# Patient Record
Sex: Female | Born: 1975 | Race: Black or African American | Hispanic: No | Marital: Single | Smoking: Never smoker
Health system: Southern US, Community
[De-identification: ages and names within clinical notes are randomized; demographics above are authoritative.]

## PROBLEM LIST (undated history)

## (undated) DIAGNOSIS — J020 Streptococcal pharyngitis: Secondary | ICD-10-CM

## (undated) DIAGNOSIS — F329 Major depressive disorder, single episode, unspecified: Secondary | ICD-10-CM

## (undated) DIAGNOSIS — F419 Anxiety disorder, unspecified: Secondary | ICD-10-CM

## (undated) DIAGNOSIS — F32A Depression, unspecified: Secondary | ICD-10-CM

## (undated) DIAGNOSIS — I2699 Other pulmonary embolism without acute cor pulmonale: Secondary | ICD-10-CM

## (undated) HISTORY — PX: WISDOM TOOTH EXTRACTION: SHX21

---

## 2004-01-20 ENCOUNTER — Other Ambulatory Visit: Admission: RE | Admit: 2004-01-20 | Discharge: 2004-01-20 | Payer: Self-pay | Admitting: Obstetrics and Gynecology

## 2005-11-21 ENCOUNTER — Inpatient Hospital Stay (HOSPITAL_COMMUNITY): Admission: AD | Admit: 2005-11-21 | Discharge: 2005-11-21 | Payer: Self-pay | Admitting: Obstetrics & Gynecology

## 2005-11-24 ENCOUNTER — Inpatient Hospital Stay (HOSPITAL_COMMUNITY): Admission: AD | Admit: 2005-11-24 | Discharge: 2005-11-24 | Payer: Self-pay | Admitting: Obstetrics and Gynecology

## 2005-12-01 ENCOUNTER — Inpatient Hospital Stay (HOSPITAL_COMMUNITY): Admission: AD | Admit: 2005-12-01 | Discharge: 2005-12-01 | Payer: Self-pay | Admitting: Obstetrics and Gynecology

## 2005-12-07 ENCOUNTER — Inpatient Hospital Stay (HOSPITAL_COMMUNITY): Admission: AD | Admit: 2005-12-07 | Discharge: 2005-12-07 | Payer: Self-pay | Admitting: *Deleted

## 2005-12-14 ENCOUNTER — Inpatient Hospital Stay (HOSPITAL_COMMUNITY): Admission: AD | Admit: 2005-12-14 | Discharge: 2005-12-14 | Payer: Self-pay | Admitting: *Deleted

## 2005-12-21 ENCOUNTER — Inpatient Hospital Stay (HOSPITAL_COMMUNITY): Admission: AD | Admit: 2005-12-21 | Discharge: 2005-12-21 | Payer: Self-pay | Admitting: *Deleted

## 2005-12-28 ENCOUNTER — Inpatient Hospital Stay (HOSPITAL_COMMUNITY): Admission: AD | Admit: 2005-12-28 | Discharge: 2005-12-28 | Payer: Self-pay | Admitting: *Deleted

## 2005-12-28 ENCOUNTER — Ambulatory Visit: Payer: Self-pay | Admitting: *Deleted

## 2005-12-31 ENCOUNTER — Ambulatory Visit: Payer: Self-pay | Admitting: *Deleted

## 2005-12-31 ENCOUNTER — Inpatient Hospital Stay (HOSPITAL_COMMUNITY): Admission: AD | Admit: 2005-12-31 | Discharge: 2005-12-31 | Payer: Self-pay | Admitting: *Deleted

## 2006-01-06 ENCOUNTER — Inpatient Hospital Stay (HOSPITAL_COMMUNITY): Admission: AD | Admit: 2006-01-06 | Discharge: 2006-01-06 | Payer: Self-pay | Admitting: Obstetrics & Gynecology

## 2006-01-13 ENCOUNTER — Inpatient Hospital Stay (HOSPITAL_COMMUNITY): Admission: AD | Admit: 2006-01-13 | Discharge: 2006-01-13 | Payer: Self-pay | Admitting: *Deleted

## 2006-07-25 ENCOUNTER — Other Ambulatory Visit: Admission: RE | Admit: 2006-07-25 | Discharge: 2006-07-25 | Payer: Self-pay | Admitting: Obstetrics and Gynecology

## 2006-12-12 ENCOUNTER — Emergency Department (HOSPITAL_COMMUNITY): Admission: EM | Admit: 2006-12-12 | Discharge: 2006-12-12 | Payer: Self-pay | Admitting: Emergency Medicine

## 2007-01-03 ENCOUNTER — Inpatient Hospital Stay (HOSPITAL_COMMUNITY): Admission: AD | Admit: 2007-01-03 | Discharge: 2007-01-03 | Payer: Self-pay | Admitting: Obstetrics and Gynecology

## 2007-04-22 ENCOUNTER — Inpatient Hospital Stay (HOSPITAL_COMMUNITY): Admission: AD | Admit: 2007-04-22 | Discharge: 2007-04-25 | Payer: Self-pay | Admitting: Obstetrics and Gynecology

## 2008-09-25 ENCOUNTER — Inpatient Hospital Stay (HOSPITAL_COMMUNITY): Admission: AD | Admit: 2008-09-25 | Discharge: 2008-09-25 | Payer: Self-pay | Admitting: Obstetrics and Gynecology

## 2008-10-03 ENCOUNTER — Emergency Department (HOSPITAL_COMMUNITY): Admission: EM | Admit: 2008-10-03 | Discharge: 2008-10-04 | Payer: Self-pay | Admitting: Emergency Medicine

## 2009-05-08 ENCOUNTER — Inpatient Hospital Stay (HOSPITAL_COMMUNITY): Admission: AD | Admit: 2009-05-08 | Discharge: 2009-05-08 | Payer: Self-pay | Admitting: Obstetrics & Gynecology

## 2009-05-11 ENCOUNTER — Inpatient Hospital Stay (HOSPITAL_COMMUNITY): Admission: AD | Admit: 2009-05-11 | Discharge: 2009-05-11 | Payer: Self-pay | Admitting: Obstetrics & Gynecology

## 2009-09-16 ENCOUNTER — Ambulatory Visit (HOSPITAL_COMMUNITY): Admission: RE | Admit: 2009-09-16 | Discharge: 2009-09-16 | Payer: Self-pay | Admitting: Obstetrics

## 2009-09-24 ENCOUNTER — Ambulatory Visit (HOSPITAL_COMMUNITY): Admission: RE | Admit: 2009-09-24 | Discharge: 2009-09-24 | Payer: Self-pay | Admitting: Obstetrics & Gynecology

## 2009-10-15 ENCOUNTER — Ambulatory Visit (HOSPITAL_COMMUNITY): Admission: RE | Admit: 2009-10-15 | Discharge: 2009-10-15 | Payer: Self-pay | Admitting: Obstetrics & Gynecology

## 2009-12-12 ENCOUNTER — Ambulatory Visit (HOSPITAL_COMMUNITY): Admission: RE | Admit: 2009-12-12 | Discharge: 2009-12-12 | Payer: Self-pay | Admitting: Obstetrics & Gynecology

## 2010-01-12 ENCOUNTER — Ambulatory Visit: Payer: Self-pay | Admitting: Advanced Practice Midwife

## 2010-01-12 ENCOUNTER — Inpatient Hospital Stay (HOSPITAL_COMMUNITY): Admission: AD | Admit: 2010-01-12 | Discharge: 2010-01-12 | Payer: Self-pay | Admitting: Obstetrics

## 2010-03-11 ENCOUNTER — Ambulatory Visit: Payer: Self-pay | Admitting: Physician Assistant

## 2010-03-11 ENCOUNTER — Inpatient Hospital Stay (HOSPITAL_COMMUNITY): Admission: AD | Admit: 2010-03-11 | Discharge: 2010-03-11 | Payer: Self-pay | Admitting: Obstetrics

## 2010-03-19 ENCOUNTER — Ambulatory Visit (HOSPITAL_COMMUNITY): Admission: RE | Admit: 2010-03-19 | Discharge: 2010-03-19 | Payer: Self-pay | Admitting: Obstetrics & Gynecology

## 2010-03-26 ENCOUNTER — Inpatient Hospital Stay (HOSPITAL_COMMUNITY): Admission: AD | Admit: 2010-03-26 | Discharge: 2010-03-28 | Payer: Self-pay | Admitting: Obstetrics & Gynecology

## 2010-12-27 ENCOUNTER — Encounter: Payer: Self-pay | Admitting: Emergency Medicine

## 2011-02-23 LAB — CBC
MCHC: 33.2 g/dL (ref 30.0–36.0)
Platelets: 123 10*3/uL — ABNORMAL LOW (ref 150–400)
RBC: 3.68 MIL/uL — ABNORMAL LOW (ref 3.87–5.11)
RDW: 14.2 % (ref 11.5–15.5)
WBC: 10 10*3/uL (ref 4.0–10.5)

## 2011-02-23 LAB — RPR: RPR Ser Ql: NONREACTIVE

## 2011-02-24 LAB — URINALYSIS, ROUTINE W REFLEX MICROSCOPIC
Bilirubin Urine: NEGATIVE
Glucose, UA: 100 mg/dL — AB
Glucose, UA: NEGATIVE mg/dL
Hgb urine dipstick: NEGATIVE
Protein, ur: NEGATIVE mg/dL
Specific Gravity, Urine: >1.03 — ABNORMAL HIGH (ref 1.005–1.030)
Specific Gravity, Urine: 1.02 (ref 1.005–1.030)
pH: 6 (ref 5.0–8.0)

## 2011-02-24 LAB — GLUCOSE, CAPILLARY: Glucose-Capillary: 102 mg/dL — ABNORMAL HIGH (ref 70–99)

## 2011-03-15 LAB — CBC
HCT: 37.6 % (ref 36.0–46.0)
Hemoglobin: 12.8 g/dL (ref 12.0–15.0)
RBC: 4.3 MIL/uL (ref 3.87–5.11)

## 2011-03-15 LAB — WET PREP, GENITAL
Clue Cells Wet Prep HPF POC: NONE SEEN
Trich, Wet Prep: NONE SEEN
Yeast Wet Prep HPF POC: NONE SEEN

## 2011-03-15 LAB — ABO/RH: ABO/RH(D): AB POS

## 2011-04-23 NOTE — Discharge Summary (Signed)
Dana Carpenter, Dana Carpenter NO.:  [id number]   MEDICAL RECORD NO.:  [id number]          PATIENT TYPE:  INP   LOCATION:  9127                          FACILITY:  [facility]   PHYSICIAN:  Huel Cote, M.D. DATE OF BIRTH:  Apr 09, 1976   DATE OF ADMISSION:  04/22/2007  DATE OF DISCHARGE:  04/25/2007                               DISCHARGE SUMMARY   DISCHARGE DIAGNOSES:  1. Term pregnancy at 37 weeks delivered.  2. Status post normal spontaneous vaginal delivery.   DISCHARGE MEDICATIONS:  1. Motrin 600 mg p.o. every 6 hours.  2. Percocet 1-2 tablets p.o. every 4 hours.   DISCHARGE FOLLOWUP:  The patient is to follow up in the office in 6  weeks for her routine postpartum exam.   HOSPITAL COURSE:  The patient 35 year old G8 P2-0-5-2 who presented at  72 plus weeks gestation to maternity admissions with contractions every  2-5 minutes and uncomfortable. Cervix changed from 3-5 cm with walking.  Prenatal care been uncomplicated. Prenatals as follows; AB positive, Rh  negative, sickle normal, RPR nonreactive, rubella immune, hepatitis B  surface antigen negative, HIV nonreactive, GC negative, chlamydia  negative, 1-hour Glucola 100, group B strep negative.   PAST OB HISTORY:  1993 she had an ectopic, 1997 she had a vaginal  delivery of 6 pounds 5 ounces infant, in 1999 she had a vaginal delivery  of 7 pounds 10 ounces and she had two elective abortions in 2000 and  2006.  She had one miscarriage in 2001 and one right ectopic pregnancy  in 2006.   PAST GYN HISTORY:  None.   PAST SURGICAL HISTORY:  D&C.   PAST MEDICAL HISTORY:  None.   ALLERGIES:  None.   HOSPITAL COURSE:  On admission she was afebrile with stable vital signs.  Fetal heart rate was reactive was reassuring.  Contractions were every 2-  5 minutes.  Cervix was 80, 4-5, and -1 station.  She had rupture of  membranes performed and continued to progress well.  She reached  complete dilation and pushed great  with a normal spontaneous vaginal  delivery of a vigorous female infant over intact perineum.  There is a  tight nuchal cord delivered through. Moderate shoulder dystocia was  relieved with McRoberts and corkscrew maneuver.  Apgars were  08/09, weight was 7 pounds 14 ounces.  She had a small right  periurethral laceration reapproximated with 3-0 Vicryl for hemostasis.  By postpartum day #2 she was doing quite well and her discharge  hemoglobin was 9.8.  She was tolerating her pain medicines and was felt  stable for discharge home.      Huel Cote, M.D.  Electronically Signed     KR/MEDQ  D:  06/05/2007  T:  06/05/2007  Job:  [id number]

## 2011-09-06 LAB — WET PREP, GENITAL: Clue Cells Wet Prep HPF POC: NONE SEEN

## 2011-09-06 LAB — CBC
HCT: 32.2 — ABNORMAL LOW
MCHC: 32.9
MCV: 86.8
Platelets: 220
RDW: 14.2

## 2011-09-06 LAB — GC/CHLAMYDIA PROBE AMP, GENITAL: Chlamydia, DNA Probe: NEGATIVE

## 2011-09-07 LAB — POCT CARDIAC MARKERS
CKMB, poc: <1 — ABNORMAL LOW
Myoglobin, poc: 35.5

## 2011-09-07 LAB — D-DIMER, QUANTITATIVE: D-Dimer, Quant: 1.54 — ABNORMAL HIGH

## 2012-02-06 ENCOUNTER — Encounter (HOSPITAL_COMMUNITY): Payer: Self-pay | Admitting: *Deleted

## 2012-02-06 ENCOUNTER — Emergency Department (INDEPENDENT_AMBULATORY_CARE_PROVIDER_SITE_OTHER)
Admission: EM | Admit: 2012-02-06 | Discharge: 2012-02-06 | Disposition: A | Discharge status: Home / Self Care | Payer: Managed Care, Other (non HMO) | Source: Home / Self Care | Attending: Family Medicine | Admitting: Family Medicine

## 2012-02-06 DIAGNOSIS — J029 Acute pharyngitis, unspecified: Secondary | ICD-10-CM

## 2012-02-06 HISTORY — DX: Streptococcal pharyngitis: J02.0

## 2012-02-06 NOTE — ED Notes (Signed)
Was treated for strep and sinusitis 2 wks ago - finished amoxicillin Friday, but continues with very painful swallowing.  Now also has bilat earache and cough w/ greenish sputum and occasional SOB.  Denies any fevers.

## 2012-02-06 NOTE — ED Provider Notes (Signed)
History     CSN: [id number]  Arrival date & time 02/06/12  1335   First MD Initiated Contact with Patient 02/06/12 1423      Chief Complaint  Patient presents with  . Sore Throat  . Otalgia  . Cough    (Consider location/radiation/quality/duration/timing/severity/associated sxs/prior treatment) Patient is a 36 y.o. female presenting with pharyngitis. The history is provided by the patient. No language interpreter was used.  Sore Throat This is a new problem. The problem occurs constantly. The problem has been gradually worsening. The symptoms are aggravated by nothing. The symptoms are relieved by nothing. She has tried nothing for the symptoms. The treatment provided no relief.  Pt complains of a sore throat.  Pt complains of pain swallowing.  Pt reports she took an antibiotic and it did not help.  Pt is here with child.  Child has viral sounding illness  No past medical history on file.  No past surgical history on file.  No family history on file.  History  Substance Use Topics  . Smoking status: Not on file  . Smokeless tobacco: Not on file  . Alcohol Use: Not on file    OB History    No data available      Review of Systems  HENT: Positive for congestion and mouth sores.   All other systems reviewed and are negative.    Allergies  Review of patient's allergies indicates not on file.  Home Medications  No current outpatient prescriptions on file.  Pulse 95  Temp(Src) 98.1 F (36.7 C) (Oral)  Resp 18  SpO2 98%  Physical Exam  Vitals reviewed. Constitutional: She appears well-developed and well-nourished.  HENT:  Head: Normocephalic and atraumatic.  Right Ear: External ear normal.  Left Ear: External ear normal.  Mouth/Throat: Oropharynx is clear and moist.  Eyes: Conjunctivae and EOM are normal. Pupils are equal, round, and reactive to light.  Neck: Normal range of motion. Neck supple.  Cardiovascular: Normal rate.   Pulmonary/Chest: Effort  normal.  Abdominal: Soft.  Musculoskeletal: Normal range of motion.  Neurological: She is alert. She has normal reflexes.  Skin: Skin is warm.  Psychiatric: She has a normal mood and affect.    ED Course  Procedures (including critical care time)   Labs Reviewed  POCT RAPID STREP A ([facility] CARE ONLY)   No results found.   No diagnosis found.    MDM  Strep negative,  I suspect viral illness.   I advised ibuprofen,  Salt water gargles,        Langston Masker, [place] 02/06/12 1504

## 2012-02-06 NOTE — Discharge Instructions (Signed)
Antibiotic Nonuse  Your caregiver felt that the infection or problem was not one that would be helped with an antibiotic. Infections may be caused by viruses or bacteria. Only a caregiver can tell which one of these is the likely cause of an illness. A cold is the most common cause of infection in both adults and children. A cold is a virus. Antibiotic treatment will have no effect on a viral infection. Viruses can lead to many lost days of work caring for sick children and many missed days of school. Children may catch as many as 10 "colds" or "flus" per year during which they can be tearful, cranky, and uncomfortable. The goal of treating a virus is aimed at keeping the ill person comfortable. Antibiotics are medications used to help the body fight bacterial infections. There are relatively few types of bacteria that cause infections but there are hundreds of viruses. While both viruses and bacteria cause infection they are very different types of germs. A viral infection will typically go away by itself within 7 to 10 days. Bacterial infections may spread or get worse without antibiotic treatment. Examples of bacterial infections are:  Sore throats (like strep throat or tonsillitis).   Infection in the lung (pneumonia).   Ear and skin infections.  Examples of viral infections are:  Colds or flus.   Most coughs and bronchitis.   Sore throats not caused by Strep.   Runny noses.  It is often best not to take an antibiotic when a viral infection is the cause of the problem. Antibiotics can kill off the helpful bacteria that we have inside our body and allow harmful bacteria to start growing. Antibiotics can cause side effects such as allergies, nausea, and diarrhea without helping to improve the symptoms of the viral infection. Additionally, repeated uses of antibiotics can cause bacteria inside of our body to become resistant. That resistance can be passed onto harmful bacterial. The next time  you have an infection it may be harder to treat if antibiotics are used when they are not needed. Not treating with antibiotics allows our own immune system to develop and take care of infections more efficiently. Also, antibiotics will work better for us when they are prescribed for bacterial infections. Treatments for a child that is ill may include:  Give extra fluids throughout the day to stay hydrated.   Get plenty of rest.   Only give your child over-the-counter or prescription medicines for pain, discomfort, or fever as directed by your caregiver.   The use of a cool mist humidifier may help stuffy noses.   Cold medications if suggested by your caregiver.  Your caregiver may decide to start you on an antibiotic if:  The problem you were seen for today continues for a longer length of time than expected.   You develop a secondary bacterial infection.  SEEK MEDICAL CARE IF:  Fever lasts longer than 5 days.   Symptoms continue to get worse after 5 to 7 days or become severe.   Difficulty in breathing develops.   Signs of dehydration develop (poor drinking, rare urinating, dark colored urine).   Changes in behavior or worsening tiredness (listlessness or lethargy).  Document Released: 01/31/2002 Document Revised: 11/11/2011 Document Reviewed: 07/30/2009 Exit[employer] Patient Information 2012 [employer]. 

## 2012-02-08 NOTE — ED Provider Notes (Signed)
Medical screening examination/treatment/procedure(s) were performed by resident physician or non-physician practitioner and as supervising physician I was immediately available for consultation/collaboration.   Jahzir Strohmeier DOUGLAS MD.    Vernica Wachtel Douglas Snigdha Howser, MD 02/08/12 1509 

## 2012-05-14 ENCOUNTER — Ambulatory Visit: Payer: Managed Care, Other (non HMO) | Admitting: Physician Assistant

## 2012-05-14 VITALS — BP 114/71 | HR 82 | Temp 98.2°F | Resp 16 | Ht 67.5 in | Wt 193.0 lb

## 2012-05-14 DIAGNOSIS — N899 Noninflammatory disorder of vagina, unspecified: Secondary | ICD-10-CM

## 2012-05-14 DIAGNOSIS — B373 Candidiasis of vulva and vagina: Secondary | ICD-10-CM

## 2012-05-14 DIAGNOSIS — N760 Acute vaginitis: Secondary | ICD-10-CM

## 2012-05-14 DIAGNOSIS — N898 Other specified noninflammatory disorders of vagina: Secondary | ICD-10-CM

## 2012-05-14 DIAGNOSIS — L293 Anogenital pruritus, unspecified: Secondary | ICD-10-CM

## 2012-05-14 LAB — POCT WET PREP WITH KOH

## 2012-05-14 MED ORDER — METRONIDAZOLE 500 MG PO TABS: 500.0000 mg | ORAL_TABLET | Freq: Two times a day (BID) | ORAL | Status: AC

## 2012-05-14 MED ORDER — FLUCONAZOLE 150 MG PO TABS: 150.0000 mg | ORAL_TABLET | ORAL | Status: AC

## 2012-05-14 NOTE — Progress Notes (Signed)
  Subjective:    Patient ID: Dana Carpenter, female    DOB: 1976/03/30, 36 y.o.   MRN: [id number]  HPI Dana Carpenter comes in today c/o 5 days of vaginal irritation and yellowish discharge.  She was tested 1 month ago for STD and results were negative.  She has not had any new partners since.  Last IC was 2-3 weeks ago.  She has used Monistat that seemed to make her symptoms worse.  She has no abdominal pain, back pain, n/v or f/c.  LMP 04/21/12.  She started BCP 2-3 weeks ago.  Review of Systems As noted in HPI    Objective:   Physical Exam  Constitutional: She is oriented to person, place, and time. Vital signs are normal. She appears well-developed and well-nourished.  Cardiovascular: Normal rate and regular rhythm.   Pulmonary/Chest: Effort normal and breath sounds normal.  Abdominal: Normal appearance and bowel sounds are normal. There is no tenderness.  Genitourinary: Uterus normal.    There is no rash, tenderness or lesion on the right labia. There is rash on the left labia. There is no tenderness or lesion on the left labia. Cervix exhibits no motion tenderness and no friability. Right adnexum displays no tenderness. Left adnexum displays no tenderness. Vaginal discharge (Thick yellow white discharge) found.  Neurological: She is alert and oriented to person, place, and time.  Skin: Skin is warm and dry.     Results for orders placed in visit on 05/14/12  POCT WET PREP WITH KOH      Component Value Range   Trichomonas, UA Negative     Clue Cells Wet Prep HPF POC tntc     Epithelial Wet Prep HPF POC 3-     Yeast Wet Prep HPF POC neg     Bacteria Wet Prep HPF POC 4+     RBC Wet Prep HPF POC 2-3     WBC Wet Prep HPF POC 3-4     KOH Prep POC Positive          Assessment & Plan:  BV Yeast vaginitis  Diflucan 150 mg 1 po now and in 1 week.   Flagyl 500 mg bid for 7 days.  No ETOH Hand out given Return 2-3 weeks for retest wet prep

## 2012-05-14 NOTE — Patient Instructions (Signed)
Bacterial Vaginosis Bacterial vaginosis (BV) is a vaginal infection where the normal balance of bacteria in the vagina is disrupted. The normal balance is then replaced by an overgrowth of certain bacteria. There are several different kinds of bacteria that can cause BV. BV is the most common vaginal infection in women of childbearing age. CAUSES   The cause of BV is not fully understood. BV develops when there is an increase or imbalance of harmful bacteria.   Some activities or behaviors can upset the normal balance of bacteria in the vagina and put women at increased risk including:   Having a new sex partner or multiple sex partners.   Douching.   Using an intrauterine device (IUD) for contraception.   It is not clear what role sexual activity plays in the development of BV. However, women that have never had sexual intercourse are rarely infected with BV.  Women do not get BV from toilet seats, bedding, swimming pools or from touching objects around them.  SYMPTOMS   Grey vaginal discharge.   A fish-like odor with discharge, especially after sexual intercourse.   Itching or burning of the vagina and vulva.   Burning or pain with urination.   Some women have no signs or symptoms at all.  DIAGNOSIS  Your caregiver must examine the vagina for signs of BV. Your caregiver will perform lab tests and look at the sample of vaginal fluid through a microscope. They will look for bacteria and abnormal cells (clue cells), a pH test higher than 4.5, and a positive amine test all associated with BV.  RISKS AND COMPLICATIONS   Pelvic inflammatory disease (PID).   Infections following gynecology surgery.   Developing HIV.   Developing herpes virus.  TREATMENT  Sometimes BV will clear up without treatment. However, all women with symptoms of BV should be treated to avoid complications, especially if gynecology surgery is planned. Female partners generally do not need to be treated. However,  BV may spread between female sex partners so treatment is helpful in preventing a recurrence of BV.   BV may be treated with antibiotics. The antibiotics come in either pill or vaginal cream forms. Either can be used with nonpregnant or pregnant women, but the recommended dosages differ. These antibiotics are not harmful to the baby.   BV can recur after treatment. If this happens, a second round of antibiotics will often be prescribed.   Treatment is important for pregnant women. If not treated, BV can cause a premature delivery, especially for a pregnant woman who had a premature birth in the past. All pregnant women who have symptoms of BV should be checked and treated.   For chronic reoccurrence of BV, treatment with a type of prescribed gel vaginally twice a week is helpful.  HOME CARE INSTRUCTIONS   Finish all medication as directed by your caregiver.   Do not have sex until treatment is completed.   Tell your sexual partner that you have a vaginal infection. They should see their caregiver and be treated if they have problems, such as a mild rash or itching.   Practice safe sex. Use condoms. Only have 1 sex partner.  PREVENTION  Basic prevention steps can help reduce the risk of upsetting the natural balance of bacteria in the vagina and developing BV:  Do not have sexual intercourse (be abstinent).   Do not douche.   Use all of the medicine prescribed for treatment of BV, even if the signs and symptoms go away.     Tell your sex partner if you have BV. That way, they can be treated, if needed, to prevent reoccurrence.  SEEK MEDICAL CARE IF:   Your symptoms are not improving after 3 days of treatment.   You have increased discharge, pain, or fever.  MAKE SURE YOU:   Understand these instructions.   Will watch your condition.   Will get help right away if you are not doing well or get worse.  FOR MORE INFORMATION  Division of STD Prevention (DSTDP), Centers for Disease  Control and Prevention: www.cdc.gov/std American [employer] Association (ASHA): www.ashastd.org  Document Released: 11/22/2005 Document Revised: 11/11/2011 Document Reviewed: 05/15/2009 [employer] Patient Information 2012 [employer].Monilial Vaginitis Vaginitis in a soreness, swelling and redness (inflammation) of the vagina and vulva. Monilial vaginitis is not a sexually transmitted infection. CAUSES  Yeast vaginitis is caused by yeast (candida) that is normally found in your vagina. With a yeast infection, the candida has overgrown in number to a point that upsets the chemical balance. SYMPTOMS   White, thick vaginal discharge.   Swelling, itching, redness and irritation of the vagina and possibly the lips of the vagina (vulva).   Burning or painful urination.   Painful intercourse.  DIAGNOSIS  Things that may contribute to monilial vaginitis are:  Postmenopausal and virginal states.   Pregnancy.   Infections.   Being tired, sick or stressed, especially if you had monilial vaginitis in the past.   Diabetes. Good control will help lower the chance.   Birth control pills.   Tight fitting garments.   Using bubble bath, feminine sprays, douches or deodorant tampons.   Taking certain medications that kill germs (antibiotics).   Sporadic recurrence can occur if you become ill.  TREATMENT  Your caregiver will give you medication.  There are several kinds of anti monilial vaginal creams and suppositories specific for monilial vaginitis. For recurrent yeast infections, use a suppository or cream in the vagina 2 times a week, or as directed.   Anti-monilial or steroid cream for the itching or irritation of the vulva may also be used. Get your caregiver's permission.   Painting the vagina with methylene blue solution may help if the monilial cream does not work.   Eating yogurt may help prevent monilial vaginitis.  HOME CARE INSTRUCTIONS   Finish all medication as  prescribed.   Do not have sex until treatment is completed or after your caregiver tells you it is okay.   Take warm sitz baths.   Do not douche.   Do not use tampons, especially scented ones.   Wear cotton underwear.   Avoid tight pants and panty hose.   Tell your sexual partner that you have a yeast infection. They should go to their caregiver if they have symptoms such as mild rash or itching.   Your sexual partner should be treated as well if your infection is difficult to eliminate.   Practice safer sex. Use condoms.   Some vaginal medications cause latex condoms to fail. Vaginal medications that harm condoms are:   Cleocin cream.   Butoconazole (Femstat).   Terconazole (Terazol) vaginal suppository.   Miconazole (Monistat) (may be purchased over the counter).  SEEK MEDICAL CARE IF:   You have a temperature by mouth above 102 F (38.9 C).   The infection is getting worse after 2 days of treatment.   The infection is not getting better after 3 days of treatment.   You develop blisters in or around your vagina.   You   develop vaginal bleeding, and it is not your menstrual period.   You have pain when you urinate.   You develop intestinal problems.   You have pain with sexual intercourse.  Document Released: 09/01/2005 Document Revised: 11/11/2011 Document Reviewed: 05/16/2009 Exit[employer] Patient Information 2012 [employer]. 

## 2012-11-30 ENCOUNTER — Encounter (HOSPITAL_BASED_OUTPATIENT_CLINIC_OR_DEPARTMENT_OTHER): Payer: Self-pay | Admitting: Student

## 2012-11-30 ENCOUNTER — Emergency Department (HOSPITAL_BASED_OUTPATIENT_CLINIC_OR_DEPARTMENT_OTHER)
Admission: EM | Admit: 2012-11-30 | Discharge: 2012-11-30 | Disposition: A | Discharge status: Home / Self Care | Payer: Managed Care, Other (non HMO) | Attending: Emergency Medicine | Admitting: Emergency Medicine

## 2012-11-30 DIAGNOSIS — J3489 Other specified disorders of nose and nasal sinuses: Secondary | ICD-10-CM | POA: Insufficient documentation

## 2012-11-30 DIAGNOSIS — J069 Acute upper respiratory infection, unspecified: Secondary | ICD-10-CM | POA: Insufficient documentation

## 2012-11-30 DIAGNOSIS — R059 Cough, unspecified: Secondary | ICD-10-CM | POA: Insufficient documentation

## 2012-11-30 DIAGNOSIS — R6889 Other general symptoms and signs: Secondary | ICD-10-CM | POA: Insufficient documentation

## 2012-11-30 DIAGNOSIS — J029 Acute pharyngitis, unspecified: Secondary | ICD-10-CM | POA: Insufficient documentation

## 2012-11-30 DIAGNOSIS — IMO0001 Reserved for inherently not codable concepts without codable children: Secondary | ICD-10-CM | POA: Insufficient documentation

## 2012-11-30 DIAGNOSIS — R1310 Dysphagia, unspecified: Secondary | ICD-10-CM | POA: Insufficient documentation

## 2012-11-30 DIAGNOSIS — R52 Pain, unspecified: Secondary | ICD-10-CM | POA: Insufficient documentation

## 2012-11-30 DIAGNOSIS — R509 Fever, unspecified: Secondary | ICD-10-CM | POA: Insufficient documentation

## 2012-11-30 DIAGNOSIS — R51 Headache: Secondary | ICD-10-CM | POA: Insufficient documentation

## 2012-11-30 DIAGNOSIS — R05 Cough: Secondary | ICD-10-CM | POA: Insufficient documentation

## 2012-11-30 DIAGNOSIS — Z79899 Other long term (current) drug therapy: Secondary | ICD-10-CM | POA: Insufficient documentation

## 2012-11-30 DIAGNOSIS — R112 Nausea with vomiting, unspecified: Secondary | ICD-10-CM | POA: Insufficient documentation

## 2012-11-30 DIAGNOSIS — R42 Dizziness and giddiness: Secondary | ICD-10-CM | POA: Insufficient documentation

## 2012-11-30 MED ORDER — PROMETHAZINE HCL 25 MG PO TABS
25.0000 mg | ORAL_TABLET | Freq: Four times a day (QID) | ORAL | Status: DC | PRN
Start: 2012-11-30 — End: 2013-03-26

## 2012-11-30 MED ORDER — ACETAMINOPHEN-CODEINE #3 300-30 MG PO TABS: 1.0000 | ORAL_TABLET | Freq: Four times a day (QID) | ORAL | Status: DC | PRN

## 2012-11-30 MED ORDER — PENICILLIN G BENZATHINE 1200000 UNIT/2ML IM SUSP
1.2000 10*6.[IU] | Freq: Once | INTRAMUSCULAR | Status: AC
  Administered 2012-11-30: 1.2 10*6.[IU] via INTRAMUSCULAR
  Filled 2012-11-30: qty 2

## 2012-11-30 NOTE — ED Provider Notes (Signed)
History     CSN: [id number]  Arrival date & time 11/30/12  1402   First MD Initiated Contact with Patient 11/30/12 [phone]      Chief Complaint  Patient presents with  . Sore Throat  . Generalized Body Aches  . Fever    (Consider location/radiation/quality/duration/timing/severity/associated sxs/prior treatment) HPI Comments: Dana Carpenter presents ambulatory stating Dana Carpenter feels ill.  Dana Carpenter describes having a headache, sore throat, runny nose, a nonproductive cough, and myalgias.  Dana Carpenter denies any shortness of breath.  Dana Carpenter also denies neck pain or stiffness.  Dana Carpenter is unaware of any sick contacts.  Patient is a 36 y.o. female presenting with pharyngitis and fever. The history is provided by the patient. No language interpreter was used.  Sore Throat This is a new problem. The current episode started yesterday. The problem occurs constantly. The problem has been gradually worsening. Associated symptoms include headaches. Pertinent negatives include no chest pain, no abdominal pain and no shortness of breath. The symptoms are aggravated by swallowing. Dana Carpenter has tried nothing for the symptoms.  Fever Primary symptoms of the febrile illness include fever, fatigue, headaches, cough, nausea, vomiting (2 episodes) and myalgias. Primary symptoms do not include wheezing, shortness of breath, abdominal pain, diarrhea, arthralgias or rash.  The headache is not associated with neck stiffness.    Past Medical History  Diagnosis Date  . Strep pharyngitis     History reviewed. No pertinent past surgical history.  History reviewed. No pertinent family history.  History  Substance Use Topics  . Smoking status: Never Smoker   . Smokeless tobacco: Not on file  . Alcohol Use: No    OB History    Grav Para Term Preterm Abortions TAB SAB Ect Mult Living                  Review of Systems  Constitutional: Positive for fever, chills and fatigue. Negative for diaphoresis, activity change and appetite  change.  HENT: Positive for congestion, sore throat, rhinorrhea and trouble swallowing. Negative for hearing loss, ear pain, nosebleeds, facial swelling, mouth sores, neck pain, neck stiffness and ear discharge.   Eyes: Negative.   Respiratory: Positive for cough. Negative for choking, chest tightness, shortness of breath, wheezing and stridor.   Cardiovascular: Negative for chest pain, palpitations and leg swelling.  Gastrointestinal: Positive for nausea and vomiting (2 episodes). Negative for abdominal pain and diarrhea.  Genitourinary: Negative.   Musculoskeletal: Positive for myalgias. Negative for arthralgias.  Skin: Negative for rash.  Neurological: Positive for headaches. Negative for syncope and light-headedness.  Hematological: Negative.   Psychiatric/Behavioral: Negative.     Allergies  Review of patient's allergies indicates no known allergies.  Home Medications   Current Outpatient Rx  Name  Route  Sig  Dispense  Refill  . NORGESTIMATE-ETH ESTRADIOL 0.25-35 MG-MCG PO TABS   Oral   Take 1 tablet by mouth daily.         Marland Kitchen UNKNOWN TO PATIENT      Oral BCP           BP 120/76  Pulse 108  Temp 99.2 F (37.3 C) (Oral)  Resp 20  Ht 5\' 6"  (1.676 m)  Wt 190 lb (86.183 kg)  BMI 30.67 kg/m2  SpO2 100%  LMP 11/05/2012  Physical Exam  Nursing note and vitals reviewed. Constitutional: Dana Carpenter is oriented to person, place, and time. Dana Carpenter appears well-developed and well-nourished. Dana Carpenter is active.  Non-toxic appearance. Dana Carpenter does not have a sickly appearance. Dana Carpenter does  not appear ill. No distress.  HENT:  Head: Normocephalic. Head is without raccoon's eyes, without Battle's sign, without contusion, without right periorbital erythema and without left periorbital erythema. No trismus in the jaw.  Right Ear: Hearing, tympanic membrane, external ear and ear canal normal. No mastoid tenderness. Tympanic membrane is not injected and not erythematous. No hemotympanum.  Left Ear:  Hearing, tympanic membrane, external ear and ear canal normal. No mastoid tenderness. Tympanic membrane is not erythematous. No hemotympanum.  Nose: Rhinorrhea present. No mucosal edema, sinus tenderness or nasal deformity. No epistaxis. Right sinus exhibits no maxillary sinus tenderness and no frontal sinus tenderness. Left sinus exhibits no maxillary sinus tenderness and no frontal sinus tenderness.  Mouth/Throat: Uvula is midline and mucous membranes are normal. Mucous membranes are not pale, not dry and not cyanotic. Dana Carpenter does not have dentures. No oral lesions. Normal dentition. No dental abscesses, uvula swelling, lacerations or dental caries. Oropharyngeal exudate and posterior oropharyngeal erythema present. No posterior oropharyngeal edema or tonsillar abscesses.  Eyes: Conjunctivae normal are normal. Pupils are equal, round, and reactive to light. Right eye exhibits no discharge. Left eye exhibits no discharge. No scleral icterus.  Neck: Normal range of motion. Neck supple. No JVD present. No tracheal deviation present.  Cardiovascular: Regular rhythm, S1 normal, S2 normal, normal heart sounds, intact distal pulses and normal pulses.   No extrasystoles are present. Tachycardia present.  PMI is not displaced.  Exam reveals no gallop and no friction rub.   No murmur heard. Pulmonary/Chest: Effort normal and breath sounds normal. No stridor. No respiratory distress. Dana Carpenter has no wheezes. Dana Carpenter has no rales. Dana Carpenter exhibits no tenderness.  Abdominal: Soft. Bowel sounds are normal. Dana Carpenter exhibits no distension and no mass. There is no tenderness. There is no rebound and no guarding.  Musculoskeletal: Normal range of motion. Dana Carpenter exhibits no edema and no tenderness.  Lymphadenopathy:    Dana Carpenter has no cervical adenopathy.  Neurological: Dana Carpenter is alert and oriented to person, place, and time. No cranial nerve deficit.  Skin: Skin is warm and dry. No rash noted. Dana Carpenter is not diaphoretic. No erythema. No pallor.    Psychiatric: Dana Carpenter has a normal mood and affect. Her behavior is normal.    ED Course  Procedures (including critical care time)  Labs Reviewed - No data to display No results found.   No diagnosis found.    MDM  Pt presents for evaluation of a generalized ill feeling, myalgias, and sore throat.  Dana Carpenter is borderline febrile, but appears nontoxic, NAD.  Dana Carpenter has exudates covering her tonsils but no asymmetry or evidence of airway compromise.  Plan treat with a 1 time shot of penicillin.  Will provide a prescription for T#3 and an antiemetic also.  Encouraged close outpt follow-up.        Tobin Chad, MD 11/30/12 [phone]

## 2012-11-30 NOTE — ED Notes (Signed)
Cold symptoms since Monday, chills, fever, flu like symptoms

## 2012-12-02 ENCOUNTER — Emergency Department (HOSPITAL_BASED_OUTPATIENT_CLINIC_OR_DEPARTMENT_OTHER)
Admission: EM | Admit: 2012-12-02 | Discharge: 2012-12-02 | Disposition: A | Discharge status: Home / Self Care | Payer: Managed Care, Other (non HMO) | Attending: Emergency Medicine | Admitting: Emergency Medicine

## 2012-12-02 ENCOUNTER — Encounter (HOSPITAL_BASED_OUTPATIENT_CLINIC_OR_DEPARTMENT_OTHER): Payer: Self-pay | Admitting: *Deleted

## 2012-12-02 DIAGNOSIS — R110 Nausea: Secondary | ICD-10-CM | POA: Insufficient documentation

## 2012-12-02 DIAGNOSIS — R5383 Other fatigue: Secondary | ICD-10-CM | POA: Insufficient documentation

## 2012-12-02 DIAGNOSIS — Z79899 Other long term (current) drug therapy: Secondary | ICD-10-CM | POA: Insufficient documentation

## 2012-12-02 DIAGNOSIS — Z3202 Encounter for pregnancy test, result negative: Secondary | ICD-10-CM | POA: Insufficient documentation

## 2012-12-02 DIAGNOSIS — J029 Acute pharyngitis, unspecified: Secondary | ICD-10-CM

## 2012-12-02 DIAGNOSIS — R5381 Other malaise: Secondary | ICD-10-CM | POA: Insufficient documentation

## 2012-12-02 DIAGNOSIS — R51 Headache: Secondary | ICD-10-CM

## 2012-12-02 LAB — URINALYSIS, ROUTINE W REFLEX MICROSCOPIC
Glucose, UA: NEGATIVE mg/dL
Hgb urine dipstick: NEGATIVE
Ketones, ur: NEGATIVE mg/dL
Leukocytes, UA: NEGATIVE
Protein, ur: NEGATIVE mg/dL

## 2012-12-02 MED ORDER — METOCLOPRAMIDE HCL 5 MG/ML IJ SOLN
10.0000 mg | Freq: Once | INTRAMUSCULAR | Status: AC
  Administered 2012-12-02: 10 mg via INTRAVENOUS
  Filled 2012-12-02: qty 2

## 2012-12-02 MED ORDER — DIPHENHYDRAMINE HCL 50 MG/ML IJ SOLN
25.0000 mg | Freq: Once | INTRAMUSCULAR | Status: AC
  Administered 2012-12-02: 25 mg via INTRAVENOUS
  Filled 2012-12-02: qty 1

## 2012-12-02 MED ORDER — IBUPROFEN 600 MG PO TABS: 600.0000 mg | ORAL_TABLET | Freq: Four times a day (QID) | ORAL | Status: DC | PRN

## 2012-12-02 MED ORDER — KETOROLAC TROMETHAMINE 30 MG/ML IJ SOLN
30.0000 mg | Freq: Once | INTRAMUSCULAR | Status: AC
  Administered 2012-12-02: 30 mg via INTRAVENOUS
  Filled 2012-12-02: qty 1

## 2012-12-02 MED ORDER — PROCHLORPERAZINE EDISYLATE 5 MG/ML IJ SOLN
10.0000 mg | Freq: Four times a day (QID) | INTRAMUSCULAR | Status: DC | PRN
  Filled 2012-12-02: qty 2

## 2012-12-02 MED ORDER — SODIUM CHLORIDE 0.9 % IV BOLUS (SEPSIS)
1000.0000 mL | Freq: Once | INTRAVENOUS | Status: AC
  Administered 2012-12-02: 1000 mL via INTRAVENOUS

## 2012-12-02 NOTE — ED Notes (Signed)
Seen Thursday for chills and aching (here) Those s/s are gone, but now has ST, H/A and nausea

## 2012-12-02 NOTE — ED Provider Notes (Signed)
History   This chart was scribed for Ethelda Chick, MD scribed by Magnus Sinning. The patient was seen in room MH10/MH10 at 16:49   CSN: [id number]  Arrival date & time 12/02/12  1529    Chief Complaint  Patient presents with  . Headache  . URI    (Consider location/radiation/quality/duration/timing/severity/associated sxs/prior treatment) HPI Comments: Dana Carpenter is a 36 y.o. female who presents to the Emergency Department complaining of constant moderate HA, onset two days with associated ST and nausea. Patient was seen two days ago for ST with exudates, chills, and generalized body aches. She was reportedly given a pencillin shot for treatment, but she provides that she has continued to have ST and now has HA and nausea.   Patient notes she has been very tired and has been sleeping all day.  She states she has taken tylenol with codeine with minimal relief and she denies emesis. She states fever is unknown today, as she did not check temperature at home.  Patient is a 36 y.o. female presenting with headaches. The history is provided by the patient. No language interpreter was used.  Headache  This is a new problem. The current episode started 2 days ago. The problem occurs constantly. The problem has not changed since onset.The headache is associated with bright light. The pain is moderate. The pain does not radiate. Associated symptoms include malaise/fatigue and nausea. Pertinent negatives include no vomiting. She has tried acetaminophen for the symptoms. The treatment provided mild relief.    Past Medical History  Diagnosis Date  . Strep pharyngitis     History reviewed. No pertinent past surgical history.  History reviewed. No pertinent family history.  History  Substance Use Topics  . Smoking status: Never Smoker   . Smokeless tobacco: Not on file  . Alcohol Use: No    Review of Systems  Constitutional: Positive for malaise/fatigue.  HENT: Positive for sore  throat.   Gastrointestinal: Positive for nausea. Negative for vomiting.  Neurological: Positive for headaches.  All other systems reviewed and are negative.    Allergies  Review of patient's allergies indicates no known allergies.  Home Medications   Current Outpatient Rx  Name  Route  Sig  Dispense  Refill  . ACETAMINOPHEN-CODEINE #3 300-30 MG PO TABS   Oral   Take 1-2 tablets by mouth every 6 (six) hours as needed for pain.   15 tablet   0   . IBUPROFEN 600 MG PO TABS   Oral   Take 1 tablet (600 mg total) by mouth every 6 (six) hours as needed for pain.   30 tablet   0   . NORGESTIMATE-ETH ESTRADIOL 0.25-35 MG-MCG PO TABS   Oral   Take 1 tablet by mouth daily.         Marland Kitchen PROMETHAZINE HCL 25 MG PO TABS   Oral   Take 1 tablet (25 mg total) by mouth every 6 (six) hours as needed for nausea.   12 tablet   0   . UNKNOWN TO PATIENT      Oral BCP           BP 131/74  Pulse 75  Temp 98 F (36.7 C) (Oral)  Resp 18  Ht 5\' 6"  (1.676 m)  Wt 190 lb (86.183 kg)  BMI 30.67 kg/m2  SpO2 100%  LMP 11/05/2012  Physical Exam  Nursing note and vitals reviewed. Constitutional: She is oriented to person, place, and time. She appears well-developed and  well-nourished. No distress.  HENT:  Head: Normocephalic and atraumatic.  Mouth/Throat: Uvula is midline. No oropharyngeal exudate.       Shotty anterior cervial lymphadenopathy  Oropharynx is mildy erythmatous with no exudates Palate is symmetric and uvula is midline.   Eyes: Conjunctivae normal and EOM are normal. Pupils are equal, round, and reactive to light.  Neck: Neck supple. No tracheal deviation present.       No nuchal rigidity.   Cardiovascular: Normal rate.   Pulmonary/Chest: Effort normal. No respiratory distress.  Abdominal: She exhibits no distension.  Musculoskeletal: Normal range of motion.  Neurological: She is alert and oriented to person, place, and time. No sensory deficit.  Skin: Skin is dry.    Psychiatric: She has a normal mood and affect. Her behavior is normal.    ED Course  Procedures (including critical care time) DIAGNOSTIC STUDIES: Oxygen Saturation is 100% on room air, normal by my interpretation.    COORDINATION OF CARE: 16:50: Physical exam performed  Labs Reviewed  URINALYSIS, ROUTINE W REFLEX MICROSCOPIC  PREGNANCY, URINE  RAPID STREP SCREEN   No results found.   1. Pharyngitis   2. Headache       MDM  Pt presenting with c/o  Continued sore throat and headache.  She was treated with bicillin x 2 days ago and no longer has exudate on her throat.  She does have some cervical LAD, normal neuro exam, no signs of RP abscess or PTA.  Pt treated with migraine cocktail and IV hydration with improvement of symtpoms.  Discharged with strict return precautions.  Pt agreeable with plan.   I personally performed the services described in this documentation, which was scribed in my presence. The recorded information has been reviewed and is accurate.         Ethelda Chick, MD 12/03/12 1537

## 2012-12-02 NOTE — ED Notes (Signed)
Patient states that she is still unable to urinate for a UA

## 2013-02-22 ENCOUNTER — Ambulatory Visit: Payer: Managed Care, Other (non HMO) | Admitting: Obstetrics

## 2013-03-15 ENCOUNTER — Ambulatory Visit: Payer: Managed Care, Other (non HMO) | Admitting: Obstetrics

## 2013-03-18 ENCOUNTER — Emergency Department (HOSPITAL_BASED_OUTPATIENT_CLINIC_OR_DEPARTMENT_OTHER)
Admission: EM | Admit: 2013-03-18 | Discharge: 2013-03-18 | Disposition: A | Discharge status: Home / Self Care | Payer: Managed Care, Other (non HMO) | Attending: Emergency Medicine | Admitting: Emergency Medicine

## 2013-03-18 ENCOUNTER — Encounter (HOSPITAL_BASED_OUTPATIENT_CLINIC_OR_DEPARTMENT_OTHER): Payer: Self-pay | Admitting: Emergency Medicine

## 2013-03-18 ENCOUNTER — Emergency Department (HOSPITAL_BASED_OUTPATIENT_CLINIC_OR_DEPARTMENT_OTHER): Payer: Managed Care, Other (non HMO)

## 2013-03-18 DIAGNOSIS — Z8619 Personal history of other infectious and parasitic diseases: Secondary | ICD-10-CM | POA: Insufficient documentation

## 2013-03-18 DIAGNOSIS — M545 Low back pain, unspecified: Secondary | ICD-10-CM | POA: Insufficient documentation

## 2013-03-18 DIAGNOSIS — M549 Dorsalgia, unspecified: Secondary | ICD-10-CM

## 2013-03-18 DIAGNOSIS — N390 Urinary tract infection, site not specified: Secondary | ICD-10-CM

## 2013-03-18 DIAGNOSIS — Z79899 Other long term (current) drug therapy: Secondary | ICD-10-CM | POA: Insufficient documentation

## 2013-03-18 LAB — URINALYSIS, ROUTINE W REFLEX MICROSCOPIC
Glucose, UA: NEGATIVE mg/dL
Hgb urine dipstick: NEGATIVE
Ketones, ur: NEGATIVE mg/dL
Protein, ur: NEGATIVE mg/dL
Urobilinogen, UA: 1 mg/dL (ref 0.0–1.0)

## 2013-03-18 LAB — URINE MICROSCOPIC-ADD ON

## 2013-03-18 MED ORDER — HYDROMORPHONE HCL PF 1 MG/ML IJ SOLN
1.0000 mg | Freq: Once | INTRAMUSCULAR | Status: AC
  Administered 2013-03-18: 1 mg via INTRAMUSCULAR
  Filled 2013-03-18: qty 1

## 2013-03-18 MED ORDER — OXYCODONE-ACETAMINOPHEN 5-325 MG PO TABS: 1.0000 | ORAL_TABLET | ORAL | Status: DC | PRN

## 2013-03-18 MED ORDER — SULFAMETHOXAZOLE-TRIMETHOPRIM 800-160 MG PO TABS: 1.0000 | ORAL_TABLET | Freq: Two times a day (BID) | ORAL | Status: DC

## 2013-03-18 MED ORDER — DIAZEPAM 5 MG PO TABS
5.0000 mg | ORAL_TABLET | Freq: Once | ORAL | Status: AC
  Administered 2013-03-18: 5 mg via ORAL
  Filled 2013-03-18: qty 1

## 2013-03-18 MED ORDER — KETOROLAC TROMETHAMINE 60 MG/2ML IM SOLN
60.0000 mg | Freq: Once | INTRAMUSCULAR | Status: AC
  Administered 2013-03-18: 60 mg via INTRAMUSCULAR
  Filled 2013-03-18: qty 2

## 2013-03-18 MED ORDER — CYCLOBENZAPRINE HCL 10 MG PO TABS: 10.0000 mg | ORAL_TABLET | Freq: Two times a day (BID) | ORAL | Status: DC | PRN

## 2013-03-18 MED ORDER — NAPROXEN 500 MG PO TABS: 500.0000 mg | ORAL_TABLET | Freq: Two times a day (BID) | ORAL | Status: DC

## 2013-03-18 NOTE — ED Notes (Signed)
Pt c/o lower middle back pain, onset Fri w/o injury. Denies urinary sx.

## 2013-03-18 NOTE — ED Provider Notes (Signed)
History     CSN: [id number]  Arrival date & time 03/18/13  1249   First MD Initiated Contact with Patient 03/18/13 1258      Chief Complaint  Patient presents with  . Back Pain    (Consider location/radiation/quality/duration/timing/severity/associated sxs/prior treatment) HPI Comments: Patient is a 37 year old female who presents with gradual onset of lower back pain that started 2 days ago. The pain is sharp and severe and does radiate down her left leg. The pain is constant. Movement makes the pain worse. Nothing makes the pain better. Patient has tried ibuprofen for pain without relief. No associated symptoms. No saddles paresthesias or bladder/bowel incontinence.     Patient is a 37 y.o. female presenting with back pain.  Back Pain   Past Medical History  Diagnosis Date  . Strep pharyngitis     History reviewed. No pertinent past surgical history.  No family history on file.  History  Substance Use Topics  . Smoking status: Never Smoker   . Smokeless tobacco: Not on file  . Alcohol Use: No    OB History   Grav Para Term Preterm Abortions TAB SAB Ect Mult Living                  Review of Systems  Musculoskeletal: Positive for back pain.  All other systems reviewed and are negative.    Allergies  Review of patient's allergies indicates no known allergies.  Home Medications   Current Outpatient Rx  Name  Route  Sig  Dispense  Refill  . acetaminophen-codeine (TYLENOL #3) 300-30 MG per tablet   Oral   Take 1-2 tablets by mouth every 6 (six) hours as needed for pain.   15 tablet   0   . ibuprofen (ADVIL,MOTRIN) 600 MG tablet   Oral   Take 1 tablet (600 mg total) by mouth every 6 (six) hours as needed for pain.   30 tablet   0   . norgestimate-ethinyl estradiol (ORTHO-CYCLEN,SPRINTEC,PREVIFEM) 0.25-35 MG-MCG tablet   Oral   Take 1 tablet by mouth daily.         . promethazine (PHENERGAN) 25 MG tablet   Oral   Take 1 tablet (25 mg total) by  mouth every 6 (six) hours as needed for nausea.   12 tablet   0   . UNKNOWN TO PATIENT      Oral BCP           BP 122/80  Pulse 98  Temp(Src) 98.4 F (36.9 C) (Oral)  Resp 18  Ht 5\' 6"  (1.676 m)  Wt 175 lb (79.379 kg)  BMI 28.26 kg/m2  SpO2 100%  LMP 03/12/2013  Physical Exam  Nursing note and vitals reviewed. Constitutional: She is oriented to person, place, and time. She appears well-developed and well-nourished. No distress.  HENT:  Head: Normocephalic and atraumatic.  Eyes: Conjunctivae are normal.  Neck: Normal range of motion.  Cardiovascular: Normal rate and regular rhythm.  Exam reveals no gallop and no friction rub.   No murmur heard. Pulmonary/Chest: Effort normal and breath sounds normal. She has no wheezes. She has no rales. She exhibits no tenderness.  Abdominal: Soft. There is no tenderness.  Genitourinary:  No CVA tenderness.   Musculoskeletal: Normal range of motion.  L5 tenderness to palpation. Left lumbosacral paraspinal tenderness to palpation. No obvious deformity.   Neurological: She is alert and oriented to person, place, and time. Coordination normal.  Speech is goal-oriented. Moves limbs without ataxia.  Skin: Skin is warm and dry.  Psychiatric: She has a normal mood and affect. Her behavior is normal.    ED Course  Procedures (including critical care time)  Labs Reviewed - No data to display Dg Lumbar Spine Complete  03/18/2013  *RADIOLOGY REPORT*  Clinical Data: Acute onset low back pain 2 days ago.  No known injuries.  LUMBAR SPINE - COMPLETE 4+ VIEW  Comparison: None.  Findings: Five non-rib bearing lumbar vertebra with anatomic alignment.  No visible fractures.  Well-preserved disc spaces.  No pars defects.  No significant facet arthropathy.  No evidence of spondylosis.  Visualized sacroiliac joints intact.  IMPRESSION: Normal examination.   Original Report Authenticated By: Hulan Saas, M.D.      1. Back pain   2. UTI (urinary  tract infection)       MDM  2:09 PM Lumbar spine xray pending. Patient reports no relief with toradol and valium. Patient will have dilaudid IM. No bladder/bowel incontinence or saddle paresthesias. Pain is reproducible with palpation of lumbar spine and left lumbosacral paraspinal area.   2:54 PM Lumbar xray unremarkable for acute changes. Patient likely experiencing musculoskeletal pain. I will discharge the patient with percocet and flexeril for pain. Patient instructed to return with worsening or concerning symptoms.   3:58 PM Urinalysis shows UTI. I will treat her for UTI.    [name], PA-C 03/18/13 [address] PA-C 03/18/13 1611

## 2013-03-20 LAB — URINE CULTURE
Colony Count: NO GROWTH
Culture: NO GROWTH

## 2013-03-20 NOTE — ED Provider Notes (Signed)
Medical screening examination/treatment/procedure(s) were performed by non-physician practitioner and as supervising physician I was immediately available for consultation/collaboration.   Gwyneth Sprout, MD 03/20/13 2125

## 2013-03-21 ENCOUNTER — Ambulatory Visit: Payer: Managed Care, Other (non HMO)

## 2013-03-22 ENCOUNTER — Other Ambulatory Visit: Payer: Self-pay | Admitting: Family Medicine

## 2013-03-22 DIAGNOSIS — R109 Unspecified abdominal pain: Secondary | ICD-10-CM

## 2013-03-22 DIAGNOSIS — M545 Low back pain: Secondary | ICD-10-CM

## 2013-03-26 ENCOUNTER — Emergency Department (HOSPITAL_COMMUNITY): Payer: Managed Care, Other (non HMO)

## 2013-03-26 ENCOUNTER — Encounter (HOSPITAL_COMMUNITY): Payer: Self-pay | Admitting: Emergency Medicine

## 2013-03-26 ENCOUNTER — Observation Stay (HOSPITAL_COMMUNITY)
Admission: EM | Admit: 2013-03-26 | Discharge: 2013-03-27 | DRG: 176 | Disposition: A | Discharge status: Home / Self Care | Payer: Managed Care, Other (non HMO) | Attending: Internal Medicine | Admitting: Internal Medicine

## 2013-03-26 ENCOUNTER — Encounter: Payer: Self-pay | Admitting: Emergency Medicine

## 2013-03-26 DIAGNOSIS — Z86711 Personal history of pulmonary embolism: Secondary | ICD-10-CM

## 2013-03-26 DIAGNOSIS — Z79899 Other long term (current) drug therapy: Secondary | ICD-10-CM

## 2013-03-26 DIAGNOSIS — I2699 Other pulmonary embolism without acute cor pulmonale: Principal | ICD-10-CM

## 2013-03-26 HISTORY — DX: Personal history of pulmonary embolism: Z86.711

## 2013-03-26 HISTORY — DX: Other pulmonary embolism without acute cor pulmonale: I26.99

## 2013-03-26 LAB — D-DIMER, QUANTITATIVE: D-Dimer, Quant: 1.24 ug/mL-FEU — ABNORMAL HIGH (ref 0.00–0.48)

## 2013-03-26 LAB — POCT I-STAT, CHEM 8
BUN: 18 mg/dL (ref 6–23)
Calcium, Ion: 1.21 mmol/L (ref 1.12–1.23)
Creatinine, Ser: 0.7 mg/dL (ref 0.50–1.10)
Glucose, Bld: 87 mg/dL (ref 70–99)
Hemoglobin: 14.3 g/dL (ref 12.0–15.0)
TCO2: 28 mmol/L (ref 0–100)

## 2013-03-26 LAB — CBC
Platelets: 337 10*3/uL (ref 150–400)
RDW: 13.8 % (ref 11.5–15.5)
WBC: 12 10*3/uL — ABNORMAL HIGH (ref 4.0–10.5)

## 2013-03-26 LAB — TROPONIN I
Troponin I: <0.3 ng/mL (ref <0.30)
Troponin I: <0.3 ng/mL (ref <0.30)

## 2013-03-26 MED ORDER — RIVAROXABAN 15 MG PO TABS
15.0000 mg | ORAL_TABLET | Freq: Two times a day (BID) | ORAL | Status: DC
  Administered 2013-03-26 – 2013-03-27 (×3): 15 mg via ORAL
  Filled 2013-03-26 (×5): qty 1

## 2013-03-26 MED ORDER — SODIUM CHLORIDE 0.9 % IJ SOLN
3.0000 mL | Freq: Two times a day (BID) | INTRAMUSCULAR | Status: DC
  Administered 2013-03-27: 3 mL via INTRAVENOUS

## 2013-03-26 MED ORDER — PREDNISONE 10 MG PO TABS
20.0000 mg | ORAL_TABLET | Freq: Once | ORAL | Status: DC
  Filled 2013-03-26: qty 2

## 2013-03-26 MED ORDER — ONDANSETRON HCL 4 MG/2ML IJ SOLN: 4.0000 mg | Freq: Four times a day (QID) | INTRAMUSCULAR | Status: DC | PRN

## 2013-03-26 MED ORDER — HYDROMORPHONE HCL PF 1 MG/ML IJ SOLN
1.0000 mg | Freq: Once | INTRAMUSCULAR | Status: AC
  Administered 2013-03-26: 1 mg via INTRAMUSCULAR
  Filled 2013-03-26: qty 1

## 2013-03-26 MED ORDER — MORPHINE SULFATE 2 MG/ML IJ SOLN: 1.0000 mg | INTRAMUSCULAR | Status: DC | PRN

## 2013-03-26 MED ORDER — ACETAMINOPHEN 650 MG RE SUPP: 650.0000 mg | Freq: Four times a day (QID) | RECTAL | Status: DC | PRN

## 2013-03-26 MED ORDER — PREDNISONE 10 MG PO TABS
10.0000 mg | ORAL_TABLET | Freq: Every day | ORAL | Status: AC
  Administered 2013-03-27: 10 mg via ORAL
  Filled 2013-03-26: qty 1

## 2013-03-26 MED ORDER — OMEGA-3-ACID ETHYL ESTERS 1 G PO CAPS
1.0000 g | ORAL_CAPSULE | Freq: Two times a day (BID) | ORAL | Status: DC
  Administered 2013-03-26 – 2013-03-27 (×2): 1 g via ORAL
  Filled 2013-03-26 (×3): qty 1

## 2013-03-26 MED ORDER — HYDROMORPHONE HCL PF 1 MG/ML IJ SOLN
1.0000 mg | INTRAMUSCULAR | Status: AC | PRN
  Filled 2013-03-26: qty 1

## 2013-03-26 MED ORDER — RIVAROXABAN 20 MG PO TABS: 20.0000 mg | ORAL_TABLET | Freq: Every day | ORAL | Status: DC

## 2013-03-26 MED ORDER — RIVAROXABAN 15 MG PO TABS
15.0000 mg | ORAL_TABLET | Freq: Two times a day (BID) | ORAL | Status: DC
Start: 2013-03-26 — End: 2013-03-26
  Filled 2013-03-26 (×2): qty 1

## 2013-03-26 MED ORDER — OXYCODONE-ACETAMINOPHEN 5-325 MG PO TABS
1.0000 | ORAL_TABLET | ORAL | Status: DC | PRN
  Administered 2013-03-26 – 2013-03-27 (×3): 1 via ORAL
  Filled 2013-03-26 (×3): qty 1

## 2013-03-26 MED ORDER — CYCLOBENZAPRINE HCL 10 MG PO TABS
10.0000 mg | ORAL_TABLET | Freq: Two times a day (BID) | ORAL | Status: DC | PRN
  Administered 2013-03-26 – 2013-03-27 (×2): 10 mg via ORAL
  Filled 2013-03-26 (×2): qty 1

## 2013-03-26 MED ORDER — IOHEXOL 350 MG/ML SOLN
100.0000 mL | Freq: Once | INTRAVENOUS | Status: AC | PRN
  Administered 2013-03-26: 100 mL via INTRAVENOUS

## 2013-03-26 MED ORDER — KETOROLAC TROMETHAMINE 60 MG/2ML IM SOLN
60.0000 mg | Freq: Once | INTRAMUSCULAR | Status: AC
  Administered 2013-03-26: 60 mg via INTRAMUSCULAR
  Filled 2013-03-26: qty 2

## 2013-03-26 MED ORDER — ACETAMINOPHEN 325 MG PO TABS
650.0000 mg | ORAL_TABLET | Freq: Four times a day (QID) | ORAL | Status: DC | PRN
  Administered 2013-03-26: 650 mg via ORAL
  Filled 2013-03-26: qty 2

## 2013-03-26 MED ORDER — ONDANSETRON HCL 4 MG PO TABS: 4.0000 mg | ORAL_TABLET | Freq: Four times a day (QID) | ORAL | Status: DC | PRN

## 2013-03-26 MED ORDER — DIAZEPAM 5 MG PO TABS
5.0000 mg | ORAL_TABLET | Freq: Once | ORAL | Status: AC
  Administered 2013-03-26: 5 mg via ORAL
  Filled 2013-03-26: qty 1

## 2013-03-26 MED ORDER — HYDROMORPHONE HCL PF 1 MG/ML IJ SOLN
1.0000 mg | Freq: Once | INTRAMUSCULAR | Status: AC
  Administered 2013-03-26: 1 mg via INTRAVENOUS
  Filled 2013-03-26: qty 1

## 2013-03-26 MED ORDER — LEVALBUTEROL HCL 0.63 MG/3ML IN NEBU
0.6300 mg | INHALATION_SOLUTION | Freq: Four times a day (QID) | RESPIRATORY_TRACT | Status: DC
  Administered 2013-03-26 – 2013-03-27 (×3): 0.63 mg via RESPIRATORY_TRACT
  Filled 2013-03-26 (×7): qty 3

## 2013-03-26 NOTE — ED Provider Notes (Signed)
I saw and evaluated the patient, reviewed the resident's note and I agree with the findings and plan.  Acute PE, will admit. Placed on heparin. Additional inpatient workup and pain control. Pain improving in the ER  Dg Chest 2 View  03/26/2013  *RADIOLOGY REPORT*  Clinical Data: Back pain, muscle spasms, difficult to take a deep breath due to pain  CHEST - 2 VIEW  Comparison: 10/03/2008  Findings: Normal heart size, mediastinal contours, and pulmonary vascularity. Decreased lung volumes versus previous exam. Peribronchial thickening with atelectasis versus developing infiltrate at right base. Remaining lungs clear. No pleural effusion or pneumothorax. No acute osseous findings.  IMPRESSION: Mild bronchitic changes with atelectasis versus developing infiltrate right base.   Original Report Authenticated By: Ulyses Southward, M.D.    Dg Thoracic Spine 2 View  03/26/2013  *RADIOLOGY REPORT*  Clinical Data: Thoracic back pain, muscle spasm, difficult to take a deep breath  THORACIC SPINE - 2 VIEW  Comparison: Chest radiograph 10/03/2008  Findings: 12 pairs of ribs. Osseous mineralization normal. Broad-based levoconvex thoracic scoliosis. Vertebral body and disc space heights maintained. No acute fracture, subluxation or bone destruction.  IMPRESSION: Minimal scoliosis. No acute abnormalities.   Original Report Authenticated By: Ulyses Southward, M.D.    Dg Lumbar Spine Complete  03/18/2013  *RADIOLOGY REPORT*  Clinical Data: Acute onset low back pain 2 days ago.  No known injuries.  LUMBAR SPINE - COMPLETE 4+ VIEW  Comparison: None.  Findings: Five non-rib bearing lumbar vertebra with anatomic alignment.  No visible fractures.  Well-preserved disc spaces.  No pars defects.  No significant facet arthropathy.  No evidence of spondylosis.  Visualized sacroiliac joints intact.  IMPRESSION: Normal examination.   Original Report Authenticated By: Hulan Saas, M.D.    Ct Angio Chest W/cm &/or Wo Cm  03/26/2013   *RADIOLOGY REPORT*  Clinical Data: Pleuritic chest pain, shortness of breath and back pain.  CT ANGIOGRAPHY CHEST  Technique:  Multidetector CT imaging of the chest using the standard protocol during bolus administration of intravenous contrast. Multiplanar reconstructed images including MIPs were obtained and reviewed to evaluate the vascular anatomy.  Contrast: OMNIPAQUE IOHEXOL 350 MG/ML SOLN  Comparison: 10/03/2008  Findings: There is evidence of acute pulmonary embolism with occlusive thrombus identified in the right lower lobe.  The associated parenchymal airspace disease in the lower lobe with a small pleural effusion may imply ongoing changes related to pulmonary infarction.  No other acute thrombus identified in the pulmonary arteries.  The heart size is normal.  The thoracic aorta is of normal caliber. No incidental masses or enlarged lymph nodes are seen.  The bony thorax is unremarkable.  IMPRESSION: Acute pulmonary embolism in the right lower lobe with associated parenchymal airspace disease and small right pleural effusion.  Critical Value/emergent results were called by telephone at the time of interpretation on 03/26/2013 at 1044 hours to Dr. Patria Mane, who verbally acknowledged these results.   Original Report Authenticated By: Irish Lack, M.D.   I personally reviewed the imaging tests through PACS system I reviewed available ER/hospitalization records through the EMR   Lyanne Co, MD 03/26/13 1114

## 2013-03-26 NOTE — ED Notes (Signed)
Patient c/o of severe back pain and numbness to right side of body.  Patient denies any injury or trauma, states that "pain came on all of a sudden.  Patient states that she has seen several doctors for this pain and they all have diagnosed patient with back spasms.  Patient currently taking prednisone, oxycodone, and flexiril, patient not getting relief from any of the medications. Patient tearful and uncomfortable at this time.  Patient a&ox4 with not other complaints.  Will continue to monitor.

## 2013-03-26 NOTE — ED Provider Notes (Signed)
History     CSN: [id number]  Arrival date & time 03/26/13  4098   First MD Initiated Contact with Patient 03/26/13 [phone]      Chief Complaint  Patient presents with  . Back Pain    (Consider location/radiation/quality/duration/timing/severity/associated sxs/prior treatment) Patient is a 37 y.o. female presenting with back pain.  Back Pain  37 yo F presenting with right upper back pain.  She has been having lower back pain for the last week.  She has seen at least 4 doctors for this who have prescribed prednisone, percocet and flexeril.  She states that the medications were helping until yesterday morning when she experienced sudden onset of severe mid and right upper back pain associated with numbness of her right arm and subjective weakness of right arm.  It initially eased off, but recurred last night and has been persistent since then.  Feels like she cannot take a deep breath because it flares up the pain.  Denies any trauma, falls, change in activities that triggered this pain.  Denies any pain radiating down her legs (some tingling in her right leg), bowel/bladder incontinence, fevers, chills.   Past Medical History  Diagnosis Date  . Strep pharyngitis     History reviewed. No pertinent past surgical history.  No family history on file.  History  Substance Use Topics  . Smoking status: Never Smoker   . Smokeless tobacco: Not on file  . Alcohol Use: No    OB History   Grav Para Term Preterm Abortions TAB SAB Ect Mult Living                  Review of Systems  Musculoskeletal: Positive for back pain.       Right arm numbness  All other systems reviewed and are negative.    Allergies  Review of patient's allergies indicates no known allergies.  Home Medications   Current Outpatient Rx  Name  Route  Sig  Dispense  Refill  . acetaminophen-codeine (TYLENOL #3) 300-30 MG per tablet   Oral   Take 1-2 tablets by mouth every 6 (six) hours as needed for pain.   15  tablet   0   . cyclobenzaprine (FLEXERIL) 10 MG tablet   Oral   Take 1 tablet (10 mg total) by mouth 2 (two) times daily as needed for muscle spasms.   20 tablet   0   . ibuprofen (ADVIL,MOTRIN) 600 MG tablet   Oral   Take 1 tablet (600 mg total) by mouth every 6 (six) hours as needed for pain.   30 tablet   0   . naproxen (NAPROSYN) 500 MG tablet   Oral   Take 1 tablet (500 mg total) by mouth 2 (two) times daily with a meal.   30 tablet   1   . norgestimate-ethinyl estradiol (ORTHO-CYCLEN,SPRINTEC,PREVIFEM) 0.25-35 MG-MCG tablet   Oral   Take 1 tablet by mouth daily.         . promethazine (PHENERGAN) 25 MG tablet   Oral   Take 1 tablet (25 mg total) by mouth every 6 (six) hours as needed for nausea.   12 tablet   0   . sulfamethoxazole-trimethoprim (SEPTRA DS) 800-160 MG per tablet   Oral   Take 1 tablet by mouth 2 (two) times daily.   6 tablet   0     BP 129/86  Pulse 115  Temp(Src) 98.4 F (36.9 C) (Oral)  Resp 20  SpO2 96%  LMP 03/20/2013  Physical Exam  Constitutional: She is oriented to person, place, and time. She appears well-developed and well-nourished. She appears distressed.  HENT:  Head: Normocephalic and atraumatic.  Neck: Normal range of motion. Neck supple.  Musculoskeletal: She exhibits no edema and no tenderness.       Right shoulder: She exhibits decreased range of motion (secondary to right upper back pain). She exhibits no tenderness, no bony tenderness, no swelling, no deformity and no pain.       Thoracic back: She exhibits tenderness (diffusely along right paraspinous muscle, worse superiorly), bony tenderness (around T4), laceration (superficial abrasions over right lumbar area) and spasm. She exhibits no swelling, no edema and no deformity.       Back:  Right arm: 5/5 strength in grip, biceps and triceps, unable to assess shoulder strength secondary to upper back pain.  2+ radial pulse.  No edema.    Neurological: She is alert and  oriented to person, place, and time. She exhibits normal muscle tone. Coordination normal.  Skin: Skin is warm and dry. No rash noted. She is not diaphoretic. No erythema.    ED Course  Procedures (including critical care time)  Labs Reviewed  D-DIMER, QUANTITATIVE - Abnormal; Notable for the following:    D-Dimer, Quant 1.24 (*)    All other components within normal limits  POCT I-STAT, CHEM 8 - Abnormal; Notable for the following:    Potassium 3.4 (*)    All other components within normal limits   Dg Chest 2 View  03/26/2013  *RADIOLOGY REPORT*  Clinical Data: Back pain, muscle spasms, difficult to take a deep breath due to pain  CHEST - 2 VIEW  Comparison: 10/03/2008  Findings: Normal heart size, mediastinal contours, and pulmonary vascularity. Decreased lung volumes versus previous exam. Peribronchial thickening with atelectasis versus developing infiltrate at right base. Remaining lungs clear. No pleural effusion or pneumothorax. No acute osseous findings.  IMPRESSION: Mild bronchitic changes with atelectasis versus developing infiltrate right base.   Original Report Authenticated By: Ulyses Southward, M.D.    Dg Thoracic Spine 2 View  03/26/2013  *RADIOLOGY REPORT*  Clinical Data: Thoracic back pain, muscle spasm, difficult to take a deep breath  THORACIC SPINE - 2 VIEW  Comparison: Chest radiograph 10/03/2008  Findings: 12 pairs of ribs. Osseous mineralization normal. Broad-based levoconvex thoracic scoliosis. Vertebral body and disc space heights maintained. No acute fracture, subluxation or bone destruction.  IMPRESSION: Minimal scoliosis. No acute abnormalities.   Original Report Authenticated By: Ulyses Southward, M.D.    Ct Angio Chest W/cm &/or Wo Cm  03/26/2013  *RADIOLOGY REPORT*  Clinical Data: Pleuritic chest pain, shortness of breath and back pain.  CT ANGIOGRAPHY CHEST  Technique:  Multidetector CT imaging of the chest using the standard protocol during bolus administration of  intravenous contrast. Multiplanar reconstructed images including MIPs were obtained and reviewed to evaluate the vascular anatomy.  Contrast: OMNIPAQUE IOHEXOL 350 MG/ML SOLN  Comparison: 10/03/2008  Findings: There is evidence of acute pulmonary embolism with occlusive thrombus identified in the right lower lobe.  The associated parenchymal airspace disease in the lower lobe with a small pleural effusion may imply ongoing changes related to pulmonary infarction.  No other acute thrombus identified in the pulmonary arteries.  The heart size is normal.  The thoracic aorta is of normal caliber. No incidental masses or enlarged lymph nodes are seen.  The bony thorax is unremarkable.  IMPRESSION: Acute pulmonary embolism in the right lower lobe with  associated parenchymal airspace disease and small right pleural effusion.  Critical Value/emergent results were called by telephone at the time of interpretation on 03/26/2013 at 1044 hours to Dr. Patria Mane, who verbally acknowledged these results.   Original Report Authenticated By: Irish Lack, M.D.      No diagnosis found.    MDM  37 yo F with mid and right sided upper back pain, worse with deep inspiration and right shoulder movement.  This is most likely muscle spasms.  However, with the pleuritic component could consider pneumonia and PE as well; satting well on room air.  She is tachycardic, but she is in a lot of pain and distress as well.  Will check thoracic films given tenderness over T4 and a CXR.  Will give valium, IM dilaudid, IM toradol.    8:50AM: X-rays are negative for acute fracture.  Patient appears much more relaxed after medication.  Tachycardia is improved.  She states she still has pain with deep inspiration and her arm is still numb.  Dr. Ihor Dow has set her up with physical therapy to start on Friday.  However, she adds that the last 2 days, she has been "spitting blood."  Not really describing a cough, just spitting.  She is on birth  control pills and thinks her mom may have had a blood clot but isn't sure.  There is no unilateral edema or calf tenderness of exam.  Will check a d-dimer and chem-8.  Will also place IV and give additional pain medicine.     9:45AM: D-dimer came back positive, will order CTA to evaluate for PE.   10:50AM: CTA positive for right lower lobe PE with likely pulmonary infarct.  Will start heparin drip and call for admission.     Phebe Colla, MD 03/26/13 1100

## 2013-03-26 NOTE — ED Notes (Signed)
Patient states that she last took medication for her back pain last night at 10pm, flexiril and oxycodone.

## 2013-03-26 NOTE — H&P (Signed)
[facility] History and Physical  [name]:[id number] DOB: 1976-02-18 DOA: 03/26/2013  Referring physician: Phebe Colla, MD  PCP: [name], MD   Chief Complaint Back Pain    HPI:  37 yo F presenting with right upper back pain. She has been having lower back pain for the last week. She has seen at least 4 doctors for this who have prescribed prednisone, percocet and flexeril. She states that the medications were helping until yesterday morning when she experienced sudden onset of severe mid and right upper back pain associated with numbness of her right arm and subjective weakness of right arm. It initially eased off, but recurred last night and has been persistent since then. Feels like she cannot take a deep breath because it flares up the pain. Denies any trauma, falls, change in activities that triggered this pain. Denies any pain radiating down her legs (some tingling in her right leg), bowel/bladder incontinence, fevers, chills.  She is on birth control pills and thinks her mom may have had a blood clot but isn't sure. There is no unilateral edema or calf tenderness of exam       Review of Systems: negative for the following  Constitutional: Denies fever, chills, diaphoresis, appetite change and fatigue.  HEENT: Denies photophobia, eye pain, redness, hearing loss, ear pain, congestion, sore throat, rhinorrhea, sneezing, mouth sores, trouble swallowing, neck pain, neck stiffness and tinnitus.  Respiratory: Denies SOB, DOE, cough, chest tightness, and wheezing.  Cardiovascular: Denies chest pain, palpitations and leg swelling.  Gastrointestinal: Denies nausea, vomiting, abdominal pain, diarrhea, constipation, blood in stool and abdominal distention.  Genitourinary: Denies dysuria, urgency, frequency, hematuria, flank pain and difficulty urinating.  Musculoskeletal: Denies myalgias, back pain, joint swelling, arthralgias and gait problem. Positive for back  pain  Skin: Denies pallor, rash and wound.  Neurological: Denies dizziness, seizures, syncope, weakness, light-headedness, numbness and headaches.  Hematological: Denies adenopathy. Easy bruising, personal or family bleeding history  Psychiatric/Behavioral: Denies suicidal ideation, mood changes, confusion, nervousness, sleep disturbance and agitation       Past Medical History  Diagnosis Date  . Strep pharyngitis      History reviewed. No pertinent past surgical history.    Social History:  reports that she has never smoked. She does not have any smokeless tobacco history on file. She reports that she does not drink alcohol or use illicit drugs.    No Known Allergies    family history. -mother had a PE several yrs ago  Prior to Admission medications   Medication Sig Start Date End Date Taking? Authorizing Provider  cyclobenzaprine (FLEXERIL) 10 MG tablet Take 1 tablet (10 mg total) by mouth 2 (two) times daily as needed for muscle spasms. 03/18/13  Yes [name], PA-C  Lactobacillus (PROBIOTIC ACIDOPHILUS PO) Take 1 capsule by mouth 3 (three) times daily with meals.   Yes Historical Provider, MD  Multiple Vitamin (MULTIVITAMIN WITH MINERALS) TABS Take 1 tablet by mouth daily.   Yes Historical Provider, MD  Norethin-Eth Estrad-Fe Biphas (LO LOESTRIN FE PO) Take 1 tablet by mouth daily.   Yes Historical Provider, MD  Omega-3 Fatty Acids (FISH OIL) 1000 MG CAPS Take 1,000 mg by mouth 3 (three) times daily with meals.   Yes Historical Provider, MD  oxyCODONE-acetaminophen (PERCOCET) 10-325 MG per tablet Take 0.5-1 tablets by mouth every 4 (four) hours as needed for pain.   Yes Historical Provider, MD  predniSONE (DELTASONE) 10 MG tablet Take 10-60 mg by mouth See admin  instructions. Take the following number of tablets once daily on consecutive days. Start with 6 tabs and taper down to 1 tablet then stop. 03/22/13 03/27/13 Yes Historical Provider, MD  naproxen (NAPROSYN) 500 MG  tablet Take 1 tablet (500 mg total) by mouth 2 (two) times daily with a meal. 03/18/13   [name], PA-C     Physical Exam: Filed Vitals:   03/26/13 0954 03/26/13 1048 03/26/13 1131 03/26/13 1227  BP: 110/66 114/79 105/68 123/80  Pulse: 93 91 88 75  Temp:    97.3 F (36.3 C)  TempSrc:    Oral  Resp:  22 19 18   Height:   5\' 6"  (1.676 m) 5\' 6"  (1.676 m)  Weight:   81.647 kg (180 lb) 87 kg (191 lb 12.8 oz)  SpO2: 96% 97% 97% 97%     Constitutional: Vital signs reviewed. Patient is a well-developed and well-nourished in no acute distress and cooperative with exam. Alert and oriented x3.  Head: Normocephalic and atraumatic  Ear: TM normal bilaterally  Mouth: no erythema or exudates, MMM  Eyes: PERRL, EOMI, conjunctivae normal, No scleral icterus.  Neck: Supple, Trachea midline normal ROM, No JVD, mass, thyromegaly, or carotid bruit present.  Cardiovascular: RRR, S1 normal, S2 normal, no MRG, pulses symmetric and intact bilaterally  Pulmonary/Chest: CTAB, no wheezes, rales, or rhonchi  Abdominal: Soft. Non-tender, non-distended, bowel sounds are normal, no masses, organomegaly, or guarding present.  GU: no CVA tenderness Musculoskeletal: Right shoulder: She exhibits decreased range of motion (secondary to right upper back pain). She exhibits no tenderness, no bony tenderness, no swelling, no deformity and no pain.  Thoracic back: She exhibits tenderness (diffusely along right paraspinous muscle, worse superiorly), bony tenderness (around T4), laceration (superficial abrasions over right lumbar area) and spasm. She exhibits no swelling, no edema and no deformity  Ext: no edema and no cyanosis, pulses palpable bilaterally (DP and PT)  Hematology: no cervical, inginal, or axillary adenopathy.  Neurological: A&O x3, Strenght is normal and symmetric bilaterally, cranial nerve II-XII are grossly intact, no focal motor deficit, sensory intact to light touch bilaterally.  Skin: Warm, dry  and intact. No rash, cyanosis, or clubbing.  Psychiatric: Normal mood and affect. speech and behavior is normal. Judgment and thought content normal. Cognition and memory are normal.       Labs on Admission:    Basic Metabolic Panel:  Recent Labs Lab 03/26/13 0920  NA 141  K 3.4*  CL 102  GLUCOSE 87  BUN 18  CREATININE 0.70   Liver Function Tests: No results found for this basename: AST, ALT, ALKPHOS, BILITOT, PROT, ALBUMIN,  in the last 168 hours No results found for this basename: LIPASE, AMYLASE,  in the last 168 hours No results found for this basename: AMMONIA,  in the last 168 hours CBC:  Recent Labs Lab 03/26/13 0920 03/26/13 1101  WBC  --  12.0*  HGB 14.3 12.8  HCT 42.0 38.2  MCV  --  82.9  PLT  --  337   Cardiac Enzymes: No results found for this basename: CKTOTAL, CKMB, CKMBINDEX, TROPONINI,  in the last 168 hours  BNP (last 3 results) No results found for this basename: PROBNP,  in the last 8760 hours    CBG: No results found for this basename: GLUCAP,  in the last 168 hours  Radiological Exams on Admission: Dg Chest 2 View  03/26/2013  *RADIOLOGY REPORT*  Clinical Data: Back pain, muscle spasms, difficult to take a deep breath  due to pain  CHEST - 2 VIEW  Comparison: 10/03/2008  Findings: Normal heart size, mediastinal contours, and pulmonary vascularity. Decreased lung volumes versus previous exam. Peribronchial thickening with atelectasis versus developing infiltrate at right base. Remaining lungs clear. No pleural effusion or pneumothorax. No acute osseous findings.  IMPRESSION: Mild bronchitic changes with atelectasis versus developing infiltrate right base.   Original Report Authenticated By: Ulyses Southward, M.D.    Dg Thoracic Spine 2 View  03/26/2013  *RADIOLOGY REPORT*  Clinical Data: Thoracic back pain, muscle spasm, difficult to take a deep breath  THORACIC SPINE - 2 VIEW  Comparison: Chest radiograph 10/03/2008  Findings: 12 pairs of ribs.  Osseous mineralization normal. Broad-based levoconvex thoracic scoliosis. Vertebral body and disc space heights maintained. No acute fracture, subluxation or bone destruction.  IMPRESSION: Minimal scoliosis. No acute abnormalities.   Original Report Authenticated By: Ulyses Southward, M.D.    Ct Angio Chest W/cm &/or Wo Cm  03/26/2013  *RADIOLOGY REPORT*  Clinical Data: Pleuritic chest pain, shortness of breath and back pain.  CT ANGIOGRAPHY CHEST  Technique:  Multidetector CT imaging of the chest using the standard protocol during bolus administration of intravenous contrast. Multiplanar reconstructed images including MIPs were obtained and reviewed to evaluate the vascular anatomy.  Contrast: OMNIPAQUE IOHEXOL 350 MG/ML SOLN  Comparison: 10/03/2008  Findings: There is evidence of acute pulmonary embolism with occlusive thrombus identified in the right lower lobe.  The associated parenchymal airspace disease in the lower lobe with a small pleural effusion may imply ongoing changes related to pulmonary infarction.  No other acute thrombus identified in the pulmonary arteries.  The heart size is normal.  The thoracic aorta is of normal caliber. No incidental masses or enlarged lymph nodes are seen.  The bony thorax is unremarkable.  IMPRESSION: Acute pulmonary embolism in the right lower lobe with associated parenchymal airspace disease and small right pleural effusion.  Critical Value/emergent results were called by telephone at the time of interpretation on 03/26/2013 at 1044 hours to Dr. Patria Mane, who verbally acknowledged these results.   Original Report Authenticated By: Irish Lack, M.D.     EKG: Independently reviewed.  Assessment/Plan Active Problems:   Acute pulmonary embolism    Started on xarelto, hypercoaguable panel  DC hormonal therapy  Home tomorrow    Code Status:   full Family Communication: bedside Disposition Plan: admit   Time spent: 70 mins   [facility] Pager [phone]  If 7PM-7AM, please contact night-coverage [email or web address] Password [email or web address] 03/26/2013, 1:14 PM

## 2013-03-27 DIAGNOSIS — I2699 Other pulmonary embolism without acute cor pulmonale: Secondary | ICD-10-CM

## 2013-03-27 LAB — CBC
HCT: 33.3 % — ABNORMAL LOW (ref 36.0–46.0)
MCH: 27 pg (ref 26.0–34.0)
MCHC: 32.7 g/dL (ref 30.0–36.0)
MCV: 82.6 fL (ref 78.0–100.0)
Platelets: 316 10*3/uL (ref 150–400)
RDW: 14 % (ref 11.5–15.5)
WBC: 9.9 10*3/uL (ref 4.0–10.5)

## 2013-03-27 LAB — LUPUS ANTICOAGULANT PANEL
PTT Lupus Anticoagulant: 45.9 secs — ABNORMAL HIGH (ref 28.0–43.0)
PTTLA Confirmation: 16.9 secs — ABNORMAL HIGH (ref <8.0)

## 2013-03-27 LAB — TROPONIN I: Troponin I: <0.3 ng/mL (ref <0.30)

## 2013-03-27 LAB — HOMOCYSTEINE: Homocysteine: 8.2 umol/L (ref 4.0–15.4)

## 2013-03-27 MED ORDER — OXYCODONE-ACETAMINOPHEN 10-325 MG PO TABS: 0.5000 | ORAL_TABLET | ORAL | Status: DC | PRN

## 2013-03-27 MED ORDER — RIVAROXABAN 15 MG PO TABS: 15.0000 mg | ORAL_TABLET | Freq: Two times a day (BID) | ORAL | Status: DC

## 2013-03-27 MED ORDER — RIVAROXABAN 20 MG PO TABS: 20.0000 mg | ORAL_TABLET | Freq: Every day | ORAL | Status: DC

## 2013-03-27 NOTE — Progress Notes (Deleted)
Notified by CCMD that pts HR dropped into the 30's non sustained with a pause of 2.22 seconds at 0243 am.  Upon further review of alarms---@0246  pt also had a non conducted PJC.Marland Kitchen Please see saved strips.  Pt is resting quietly.  No distress noted.  Pt has maintained SR on telemetry.  Will continue to monitor . Dierdre Highman, RN

## 2013-03-27 NOTE — Progress Notes (Signed)
  Echocardiogram 2D Echocardiogram has been performed.  Dana Carpenter 03/27/2013, 11:12 AM

## 2013-03-27 NOTE — Progress Notes (Signed)
Pt provided with dc instructions and education. Pt verbalized understanding. IV removed with tip intact. Heart monitor cleaned and returned to front. Norberta Stobaugh, RN 

## 2013-03-27 NOTE — Progress Notes (Signed)
RN CM did speak to pt in reference to xarelto. Pt uses [facility] on [address]. Medication is available, however, cost will be 144.14. CM did make pt aware of cost. CM did provide pt with a savings card. Pt will have to call the company to see if she qualifies for 10.00 co pay. No further needs from CM at this time. Gala Lewandowsky, RN,BSN [phone]

## 2013-03-27 NOTE — Discharge Summary (Signed)
Physician Discharge Summary  Dana Carpenter ZOX:[id number] DOB: [birth year] DOA: 03/26/2013  PCP: [name], MD  Admit date: 03/26/2013 Discharge date: 03/27/2013  Time spent: 40 minutes  Recommendations for Outpatient Follow-up:  1. Follow up with PCP  Discharge Diagnoses:  Active Problems:   Acute pulmonary embolism   Discharge Condition: stable  Diet recommendation: heart healthy  Filed Weights   03/26/13 1131 03/26/13 1227  Weight: 81.647 kg (180 lb) 87 kg (191 lb 12.8 oz)    History of present illness:  37 yo F presenting with right upper back pain. She has been having lower back pain for the last week. She has seen at least 4 doctors for this who have prescribed prednisone, percocet and flexeril. She states that the medications were helping until yesterday morning when she experienced sudden onset of severe mid and right upper back pain associated with numbness of her right arm and subjective weakness of right arm. It initially eased off, but recurred last night and has been persistent since then. Feels like she cannot take a deep breath because it flares up the pain. Denies any trauma, falls, change in activities that triggered this pain. Denies any pain radiating down her legs (some tingling in her right leg), bowel/bladder incontinence, fevers, chills.  She is on birth control pills and thinks her mom may have had a blood clot but isn't sure. There is no unilateral edema or calf tenderness of exam   Hospital Course:  Acute pulmonary embolism:  - On xarelto - she is on oral contraceptive pill, which we have stopped. - CT angio chest Acute PE.  - afebrile.  - pain control.   Procedures:  CT angio 4.21.2014  Consultations:  none  Discharge Exam: Filed Vitals:   03/26/13 2021 03/26/13 2100 03/27/13 0126 03/27/13 0615  BP:  113/74  107/52  Pulse:  90  100  Temp:  97.4 F (36.3 C)  97.9 F (36.6 C)  TempSrc:  Oral  Oral  Resp:  20  18  Height:      Weight:       SpO2: 100% 98% 98% 100%    General: See progress note  Discharge Instructions  Discharge Orders   Future Appointments Provider Department Dept Phone   04/05/2013 3:00 PM Brock Bad, MD [facility] [phone]   Future Orders Complete By Expires     Diet - low sodium heart healthy  As directed     Increase activity slowly  As directed         Medication List    STOP taking these medications       LO LOESTRIN FE PO     naproxen 500 MG tablet  Commonly known as:  NAPROSYN     predniSONE 10 MG tablet  Commonly known as:  DELTASONE      TAKE these medications       cyclobenzaprine 10 MG tablet  Commonly known as:  FLEXERIL  Take 1 tablet (10 mg total) by mouth 2 (two) times daily as needed for muscle spasms.     Fish Oil 1000 MG Caps  Take 1,000 mg by mouth 3 (three) times daily with meals.     multivitamin with minerals Tabs  Take 1 tablet by mouth daily.     oxyCODONE-acetaminophen 10-325 MG per tablet  Commonly known as:  PERCOCET  Take 0.5-1 tablets by mouth every 4 (four) hours as needed for pain.     PROBIOTIC ACIDOPHILUS PO  Take  1 capsule by mouth 3 (three) times daily with meals.     Rivaroxaban 15 MG Tabs tablet  Commonly known as:  XARELTO  Take 1 tablet (15 mg total) by mouth 2 (two) times daily with a meal.     Rivaroxaban 20 MG Tabs  Commonly known as:  XARELTO  Take 1 tablet (20 mg total) by mouth daily with supper.  Start taking on:  04/16/2013           Follow-up Information   Follow up with NNODI, ADAKU, MD In 3 weeks. (hospital follow up)    Contact information:   [address] [place] [address] [phone]        The results of significant diagnostics from this hospitalization (including imaging, microbiology, ancillary and laboratory) are listed below for reference.    Significant Diagnostic Studies: Dg Chest 2 View  03/26/2013  *RADIOLOGY REPORT*  Clinical Data: Back pain, muscle spasms, difficult  to take a deep breath due to pain  CHEST - 2 VIEW  Comparison: 10/03/2008  Findings: Normal heart size, mediastinal contours, and pulmonary vascularity. Decreased lung volumes versus previous exam. Peribronchial thickening with atelectasis versus developing infiltrate at right base. Remaining lungs clear. No pleural effusion or pneumothorax. No acute osseous findings.  IMPRESSION: Mild bronchitic changes with atelectasis versus developing infiltrate right base.   Original Report Authenticated By: Ulyses Southward, M.D.    Dg Thoracic Spine 2 View  03/26/2013  *RADIOLOGY REPORT*  Clinical Data: Thoracic back pain, muscle spasm, difficult to take a deep breath  THORACIC SPINE - 2 VIEW  Comparison: Chest radiograph 10/03/2008  Findings: 12 pairs of ribs. Osseous mineralization normal. Broad-based levoconvex thoracic scoliosis. Vertebral body and disc space heights maintained. No acute fracture, subluxation or bone destruction.  IMPRESSION: Minimal scoliosis. No acute abnormalities.   Original Report Authenticated By: Ulyses Southward, M.D.    Dg Lumbar Spine Complete  03/18/2013  *RADIOLOGY REPORT*  Clinical Data: Acute onset low back pain 2 days ago.  No known injuries.  LUMBAR SPINE - COMPLETE 4+ VIEW  Comparison: None.  Findings: Five non-rib bearing lumbar vertebra with anatomic alignment.  No visible fractures.  Well-preserved disc spaces.  No pars defects.  No significant facet arthropathy.  No evidence of spondylosis.  Visualized sacroiliac joints intact.  IMPRESSION: Normal examination.   Original Report Authenticated By: Hulan Saas, M.D.    Ct Angio Chest W/cm &/or Wo Cm  03/26/2013  *RADIOLOGY REPORT*  Clinical Data: Pleuritic chest pain, shortness of breath and back pain.  CT ANGIOGRAPHY CHEST  Technique:  Multidetector CT imaging of the chest using the standard protocol during bolus administration of intravenous contrast. Multiplanar reconstructed images including MIPs were obtained and reviewed to  evaluate the vascular anatomy.  Contrast: OMNIPAQUE IOHEXOL 350 MG/ML SOLN  Comparison: 10/03/2008  Findings: There is evidence of acute pulmonary embolism with occlusive thrombus identified in the right lower lobe.  The associated parenchymal airspace disease in the lower lobe with a small pleural effusion may imply ongoing changes related to pulmonary infarction.  No other acute thrombus identified in the pulmonary arteries.  The heart size is normal.  The thoracic aorta is of normal caliber. No incidental masses or enlarged lymph nodes are seen.  The bony thorax is unremarkable.  IMPRESSION: Acute pulmonary embolism in the right lower lobe with associated parenchymal airspace disease and small right pleural effusion.  Critical Value/emergent results were called by telephone at the time of interpretation on 03/26/2013 at 1044  hours to Dr. Patria Mane, who verbally acknowledged these results.   Original Report Authenticated By: Irish Lack, M.D.     Microbiology: Recent Results (from the past 240 hour(s))  URINE CULTURE     Status: None   Collection Time    03/18/13  3:15 PM      Result Value Range Status   Specimen Description URINE, CLEAN CATCH   Final   Special Requests Normal   Final   Culture  Setup Time 03/19/2013 00:29   Final   Colony Count NO GROWTH   Final   Culture NO GROWTH   Final   Report Status 03/20/2013 FINAL   Final     Labs: Basic Metabolic Panel:  Recent Labs Lab 03/26/13 0920  NA 141  K 3.4*  CL 102  GLUCOSE 87  BUN 18  CREATININE 0.70   Liver Function Tests: No results found for this basename: AST, ALT, ALKPHOS, BILITOT, PROT, ALBUMIN,  in the last 168 hours No results found for this basename: LIPASE, AMYLASE,  in the last 168 hours No results found for this basename: AMMONIA,  in the last 168 hours CBC:  Recent Labs Lab 03/26/13 0920 03/26/13 1101 03/27/13 0430  WBC  --  12.0* 9.9  HGB 14.3 12.8 10.9*  HCT 42.0 38.2 33.3*  MCV  --  82.9 82.6   PLT  --  337 316   Cardiac Enzymes:  Recent Labs Lab 03/26/13 1350 03/26/13 1719 03/27/13 0002  TROPONINI <0.30 <0.30 <0.30   BNP: BNP (last 3 results) No results found for this basename: PROBNP,  in the last 8760 hours CBG: No results found for this basename: GLUCAP,  in the last 168 hours     Signed:  Marinda Elk  [facility] 03/27/2013, 11:00 AM

## 2013-03-27 NOTE — Progress Notes (Signed)
[facility] PROGRESS NOTE  Assessment/Plan:  Acute pulmonary embolism: - On xarelto - CT angio chest  Acute PE. - afebrile. - pain control.  Code Status: full Family Communication: none  Disposition Plan: home   Consultants:  noen  Procedures: CT angio : Acute pulmonary embolism in the right lower lobe with associated  parenchymal airspace disease and small right pleural effusion.   Antibiotics:  None  HPI/Subjective: Complaining of SOB  Objective: Filed Vitals:   03/26/13 2021 03/26/13 2100 03/27/13 0126 03/27/13 0615  BP:  113/74  107/52  Pulse:  90  100  Temp:  97.4 F (36.3 C)  97.9 F (36.6 C)  TempSrc:  Oral  Oral  Resp:  20  18  Height:      Weight:      SpO2: 100% 98% 98% 100%    Intake/Output Summary (Last 24 hours) at 03/27/13 1039 Last data filed at 03/27/13 1610  Gross per 24 hour  Intake    120 ml  Output    800 ml  Net   -680 ml   Filed Weights   03/26/13 1131 03/26/13 1227  Weight: 81.647 kg (180 lb) 87 kg (191 lb 12.8 oz)    Exam:  General: Alert, awake, oriented x3, in no acute distress.  HEENT: No bruits, no goiter.  Heart: Regular rate and rhythm, without murmurs, rubs, gallops.  Lungs: Good air movement,  Clear to auscultation Abdomen: Soft, nontender, nondistended, positive bowel sounds.  Neuro: Grossly intact, nonfocal.   Data Reviewed: Basic Metabolic Panel:  Recent Labs Lab 03/26/13 0920  NA 141  K 3.4*  CL 102  GLUCOSE 87  BUN 18  CREATININE 0.70   Liver Function Tests: No results found for this basename: AST, ALT, ALKPHOS, BILITOT, PROT, ALBUMIN,  in the last 168 hours No results found for this basename: LIPASE, AMYLASE,  in the last 168 hours No results found for this basename: AMMONIA,  in the last 168 hours CBC:  Recent Labs Lab 03/26/13 0920 03/26/13 1101 03/27/13 0430  WBC  --  12.0* 9.9  HGB 14.3 12.8 10.9*  HCT 42.0 38.2 33.3*  MCV  --  82.9 82.6  PLT  --  337 316   Cardiac  Enzymes:  Recent Labs Lab 03/26/13 1350 03/26/13 1719 03/27/13 0002  TROPONINI <0.30 <0.30 <0.30   BNP (last 3 results) No results found for this basename: PROBNP,  in the last 8760 hours CBG: No results found for this basename: GLUCAP,  in the last 168 hours  Recent Results (from the past 240 hour(s))  URINE CULTURE     Status: None   Collection Time    03/18/13  3:15 PM      Result Value Range Status   Specimen Description URINE, CLEAN CATCH   Final   Special Requests Normal   Final   Culture  Setup Time 03/19/2013 00:29   Final   Colony Count NO GROWTH   Final   Culture NO GROWTH   Final   Report Status 03/20/2013 FINAL   Final     Studies: Dg Chest 2 View  03/26/2013  *RADIOLOGY REPORT*  Clinical Data: Back pain, muscle spasms, difficult to take a deep breath due to pain  CHEST - 2 VIEW  Comparison: 10/03/2008  Findings: Normal heart size, mediastinal contours, and pulmonary vascularity. Decreased lung volumes versus previous exam. Peribronchial thickening with atelectasis versus developing infiltrate at right base. Remaining lungs clear. No pleural effusion or pneumothorax. No acute  osseous findings.  IMPRESSION: Mild bronchitic changes with atelectasis versus developing infiltrate right base.   Original Report Authenticated By: Ulyses Southward, M.D.    Dg Thoracic Spine 2 View  03/26/2013  *RADIOLOGY REPORT*  Clinical Data: Thoracic back pain, muscle spasm, difficult to take a deep breath  THORACIC SPINE - 2 VIEW  Comparison: Chest radiograph 10/03/2008  Findings: 12 pairs of ribs. Osseous mineralization normal. Broad-based levoconvex thoracic scoliosis. Vertebral body and disc space heights maintained. No acute fracture, subluxation or bone destruction.  IMPRESSION: Minimal scoliosis. No acute abnormalities.   Original Report Authenticated By: Ulyses Southward, M.D.    Ct Angio Chest W/cm &/or Wo Cm  03/26/2013  *RADIOLOGY REPORT*  Clinical Data: Pleuritic chest pain, shortness of  breath and back pain.  CT ANGIOGRAPHY CHEST  Technique:  Multidetector CT imaging of the chest using the standard protocol during bolus administration of intravenous contrast. Multiplanar reconstructed images including MIPs were obtained and reviewed to evaluate the vascular anatomy.  Contrast: OMNIPAQUE IOHEXOL 350 MG/ML SOLN  Comparison: 10/03/2008  Findings: There is evidence of acute pulmonary embolism with occlusive thrombus identified in the right lower lobe.  The associated parenchymal airspace disease in the lower lobe with a small pleural effusion may imply ongoing changes related to pulmonary infarction.  No other acute thrombus identified in the pulmonary arteries.  The heart size is normal.  The thoracic aorta is of normal caliber. No incidental masses or enlarged lymph nodes are seen.  The bony thorax is unremarkable.  IMPRESSION: Acute pulmonary embolism in the right lower lobe with associated parenchymal airspace disease and small right pleural effusion.  Critical Value/emergent results were called by telephone at the time of interpretation on 03/26/2013 at 1044 hours to Dr. Patria Mane, who verbally acknowledged these results.   Original Report Authenticated By: Irish Lack, M.D.     Scheduled Meds: . levalbuterol  0.63 mg Nebulization Q6H  . omega-3 acid ethyl esters  1 g Oral BID  . predniSONE  20 mg Oral Once  . [START ON 04/16/2013] rivaroxaban  20 mg Oral Q supper  . rivaroxaban  15 mg Oral BID WC  . sodium chloride  3 mL Intravenous Q12H   Continuous Infusions:    Marinda Elk  [facility] Pager [phone]. If 8PM-8AM, please contact night-coverage at [email or web address], password [facility] 03/27/2013, 10:39 AM  LOS: 1 day

## 2013-03-28 LAB — BETA-2-GLYCOPROTEIN I ABS, IGG/M/A
Beta-2 Glyco I IgG: 0 G Units (ref <20)
Beta-2-Glycoprotein I IgM: 21 M Units — ABNORMAL HIGH (ref <20)

## 2013-03-28 LAB — CARDIOLIPIN ANTIBODIES, IGG, IGM, IGA: Anticardiolipin IgG: 10 GPL U/mL — ABNORMAL LOW (ref <23)

## 2013-03-28 LAB — FACTOR 5 LEIDEN

## 2013-03-28 LAB — PROTEIN S, TOTAL: Protein S Ag, Total: 76 % (ref 60–150)

## 2013-03-28 LAB — PROTEIN C, TOTAL: Protein C, Total: 105 % (ref 72–160)

## 2013-03-30 ENCOUNTER — Encounter (HOSPITAL_COMMUNITY): Payer: Self-pay | Admitting: Radiology

## 2013-03-30 ENCOUNTER — Emergency Department (HOSPITAL_COMMUNITY)
Admission: EM | Admit: 2013-03-30 | Discharge: 2013-03-31 | Disposition: A | Discharge status: Home / Self Care | Payer: Managed Care, Other (non HMO) | Attending: Emergency Medicine | Admitting: Emergency Medicine

## 2013-03-30 ENCOUNTER — Emergency Department (HOSPITAL_COMMUNITY): Payer: Managed Care, Other (non HMO)

## 2013-03-30 DIAGNOSIS — R071 Chest pain on breathing: Secondary | ICD-10-CM | POA: Insufficient documentation

## 2013-03-30 DIAGNOSIS — Z7901 Long term (current) use of anticoagulants: Secondary | ICD-10-CM | POA: Insufficient documentation

## 2013-03-30 DIAGNOSIS — Z8619 Personal history of other infectious and parasitic diseases: Secondary | ICD-10-CM | POA: Insufficient documentation

## 2013-03-30 DIAGNOSIS — I2699 Other pulmonary embolism without acute cor pulmonale: Secondary | ICD-10-CM | POA: Insufficient documentation

## 2013-03-30 DIAGNOSIS — R0781 Pleurodynia: Secondary | ICD-10-CM

## 2013-03-30 DIAGNOSIS — R0602 Shortness of breath: Secondary | ICD-10-CM | POA: Insufficient documentation

## 2013-03-30 DIAGNOSIS — Z79899 Other long term (current) drug therapy: Secondary | ICD-10-CM | POA: Insufficient documentation

## 2013-03-30 DIAGNOSIS — R002 Palpitations: Secondary | ICD-10-CM | POA: Insufficient documentation

## 2013-03-30 LAB — CBC
Hemoglobin: 13 g/dL (ref 12.0–15.0)
MCH: 27.4 pg (ref 26.0–34.0)
MCHC: 33.6 g/dL (ref 30.0–36.0)
Platelets: 473 10*3/uL — ABNORMAL HIGH (ref 150–400)
RDW: 13.7 % (ref 11.5–15.5)

## 2013-03-30 LAB — POCT I-STAT TROPONIN I: Troponin i, poc: 0 ng/mL (ref 0.00–0.08)

## 2013-03-30 LAB — BASIC METABOLIC PANEL
Calcium: 10 mg/dL (ref 8.4–10.5)
GFR calc Af Amer: >90 mL/min (ref >90)
GFR calc non Af Amer: >90 mL/min (ref >90)
Glucose, Bld: 98 mg/dL (ref 70–99)
Potassium: 4.4 mEq/L (ref 3.5–5.1)
Sodium: 134 mEq/L — ABNORMAL LOW (ref 135–145)

## 2013-03-30 MED ORDER — IBUPROFEN 800 MG PO TABS
800.0000 mg | ORAL_TABLET | Freq: Once | ORAL | Status: AC
  Administered 2013-03-31: 800 mg via ORAL
  Filled 2013-03-30: qty 1

## 2013-03-30 MED ORDER — OXYCODONE-ACETAMINOPHEN 5-325 MG PO TABS
2.0000 | ORAL_TABLET | Freq: Once | ORAL | Status: DC
  Filled 2013-03-30: qty 2

## 2013-03-30 MED ORDER — IOHEXOL 350 MG/ML SOLN
80.0000 mL | Freq: Once | INTRAVENOUS | Status: AC | PRN
  Administered 2013-03-30: 70 mL via INTRAVENOUS

## 2013-03-30 NOTE — ED Provider Notes (Signed)
History     CSN: [id number]  Arrival date & time 03/30/13  2108   First MD Initiated Contact with Patient 03/30/13 2208      Chief Complaint  Patient presents with  . Chest Pain    (Consider location/radiation/quality/duration/timing/severity/associated sxs/prior treatment) HPI Comments: Patient presents with palpitations, shortness of breath and chest pain it started yesterday. The symptoms are intermittent and worse with deep breathing. Notably she was discharged 3 days ago after being admitted for pulmonary embolism. She was sent home on xarelto which she states compliance with. She denies any cough or fever. Denies abdominal pain nausea or vomiting. The pain is on the right side of her chest and similar location as before. It is worse with deep breathing. Her palpitations and shortness of breath have resolved. She's no distress.  The history is provided by the patient.    Past Medical History  Diagnosis Date  . Strep pharyngitis   . PE (pulmonary embolism) 03/26/2013    RT LOWER LOBE    No past surgical history on file.  No family history on file.  History  Substance Use Topics  . Smoking status: Never Smoker   . Smokeless tobacco: Never Used  . Alcohol Use: Yes     Comment: occasional    OB History   Grav Para Term Preterm Abortions TAB SAB Ect Mult Living                  Review of Systems  Constitutional: Negative for fever, activity change, appetite change and fatigue.  HENT: Negative for congestion and rhinorrhea.   Respiratory: Positive for shortness of breath. Negative for cough and chest tightness.   Cardiovascular: Positive for chest pain.  Gastrointestinal: Negative for nausea, vomiting and abdominal pain.  Genitourinary: Negative for dysuria, hematuria, vaginal bleeding and vaginal discharge.  Musculoskeletal: Negative for back pain.  Skin: Negative for rash.  Neurological: Negative for dizziness, weakness and headaches.  A complete 10 system  review of systems was obtained and all systems are negative except as noted in the HPI and PMH.    Allergies  Review of patient's allergies indicates no known allergies.  Home Medications   Current Outpatient Rx  Name  Route  Sig  Dispense  Refill  . cyclobenzaprine (FLEXERIL) 10 MG tablet   Oral   Take 1 tablet (10 mg total) by mouth 2 (two) times daily as needed for muscle spasms.   20 tablet   0   . Lactobacillus (PROBIOTIC ACIDOPHILUS PO)   Oral   Take 1 capsule by mouth 3 (three) times daily with meals.         . Multiple Vitamin (MULTIVITAMIN WITH MINERALS) TABS   Oral   Take 1 tablet by mouth daily.         . Omega-3 Fatty Acids (FISH OIL) 1000 MG CAPS   Oral   Take 1,000 mg by mouth 3 (three) times daily with meals.         Marland Kitchen oxyCODONE-acetaminophen (PERCOCET) 10-325 MG per tablet   Oral   Take 0.5-1 tablets by mouth every 4 (four) hours as needed for pain.   30 tablet   0   . Rivaroxaban (XARELTO) 15 MG TABS tablet   Oral   Take 1 tablet (15 mg total) by mouth 2 (two) times daily with a meal.   30 tablet   0     BP 126/82  Pulse 86  Temp(Src) 98.7 F (37.1 C) (Oral)  Resp  18  SpO2 98%  LMP 03/20/2013  Physical Exam  Constitutional: She is oriented to person, place, and time. She appears well-developed and well-nourished. No distress.  HENT:  Head: Normocephalic and atraumatic.  Mouth/Throat: Oropharynx is clear and moist. No oropharyngeal exudate.  Eyes: Conjunctivae and EOM are normal. Pupils are equal, round, and reactive to light.  Neck: Normal range of motion. Neck supple.  Cardiovascular: Normal rate, regular rhythm and normal heart sounds.   No murmur heard. Pulmonary/Chest: Breath sounds normal. No respiratory distress.  Abdominal: Soft. There is no tenderness. There is no rebound and no guarding.  Musculoskeletal: Normal range of motion. She exhibits no edema and no tenderness.  Neurological: She is alert and oriented to person,  place, and time. No cranial nerve deficit. She exhibits normal muscle tone. Coordination normal.  Skin: Skin is warm.    ED Course  Procedures (including critical care time)  Labs Reviewed  CBC - Abnormal; Notable for the following:    WBC 10.7 (*)    Platelets 473 (*)    All other components within normal limits  BASIC METABOLIC PANEL - Abnormal; Notable for the following:    Sodium 134 (*)    All other components within normal limits  POCT I-STAT TROPONIN I   Dg Chest 2 View  03/30/2013  *RADIOLOGY REPORT*  Clinical Data: Chest pain and shortness of breath  CHEST - 2 VIEW  Comparison: CT 03/26/2013 and chest radiograph 03/26/2013  Findings: Rounded opacity in the right lower lobe and increased, with wedge-shaped appearance on the lateral projection. Heart size is normal.  The left lung is clear.  Trace if any right pleural fluid.  No acute osseous finding.  IMPRESSION: Increased, rounded and wedge-shaped right lower lobe airspace opacity, which may represent infarct in the setting of pulmonary embolism, versus rounded atelectasis and/or loculated pleural fluid.   Original Report Authenticated By: Christiana Pellant, M.D.    Ct Angio Chest Pe W/cm &/or Wo Cm  03/31/2013  *RADIOLOGY REPORT*  Clinical Data: Shortness of breath and chest pain.  Diagnosed with pulmonary embolus on Monday.  Today with increasing symptoms.  CT ANGIOGRAPHY CHEST  Technique:  Multidetector CT imaging of the chest using the standard protocol during bolus administration of intravenous contrast. Multiplanar reconstructed images including MIPs were obtained and reviewed to evaluate the vascular anatomy.  Contrast: 70mL OMNIPAQUE IOHEXOL 350 MG/ML SOLN  Comparison: 03/26/2013  Findings: Technically adequate study with good opacification of the central and segmental pulmonary arteries.  Again demonstrated, there is a filling defect in the right lower lobe pulmonary artery with associated consolidation and airspace disease.   Small right pleural effusion.  Changes are consistent with pulmonary embolus and infarct. The area of infarct and consolidation demonstrates more prominent volume loss but is otherwise stable since the previous study.  No new areas of infarction or new pulmonary emboli are visualized.  Normal heart size.  Normal caliber thoracic aorta.  No abnormal mediastinal fluid collections.  No significant lymphadenopathy in the chest.  Esophagus is decompressed.  Mild atelectasis in the left lung bases slightly improved since previous study.  No pneumothorax.  Airways appear patent.  IMPRESSION: Right lower lobe pulmonary embolus and infarct again demonstrated with small right pleural effusion.   No evidence of significant progression.   Original Report Authenticated By: Burman Nieves, M.D.      1. Pulmonary embolism   2. Pleuritic chest pain       MDM  Chest pain, shortness of breath,  palpitations, recent diagnosis of PE. Vital stable, no distress, no hypoxia.  EKG unchanged. Troponin negative. Worsening RLL opacity on CXR consistent with PE and pulmonary infarction. Palpitations likely 2.2 anxiety.   Ambulatory without desaturation. Chest pain likely 2/2 pleurisy from pulmonary infarct.  Declined meds in ED since she is driving.  States she has oxycodone at home.   Date: 03/30/2013  Rate: 94  Rhythm: normal sinus rhythm  QRS Axis: normal  Intervals: normal  ST/T Wave abnormalities: normal  Conduction Disutrbances:none  Narrative Interpretation:   Old EKG Reviewed: unchanged          Glynn Octave, MD 03/31/13 [phone]

## 2013-03-30 NOTE — ED Notes (Signed)
Dx with PE on Monday in R lung, started on Xarelto and sent home Tuesday. Today began experiencing SOB, central chest pain and hot flashes. Chest pain is worse with deep inspiration. Pt reports taking medications as directed.

## 2013-04-05 ENCOUNTER — Ambulatory Visit (INDEPENDENT_AMBULATORY_CARE_PROVIDER_SITE_OTHER): Payer: Managed Care, Other (non HMO) | Admitting: Obstetrics

## 2013-04-05 ENCOUNTER — Encounter: Payer: Self-pay | Admitting: Obstetrics

## 2013-04-05 VITALS — BP 123/83 | HR 87 | Temp 98.3°F | Ht 66.0 in | Wt 184.0 lb

## 2013-04-05 DIAGNOSIS — N390 Urinary tract infection, site not specified: Secondary | ICD-10-CM

## 2013-04-05 DIAGNOSIS — Z01419 Encounter for gynecological examination (general) (routine) without abnormal findings: Secondary | ICD-10-CM

## 2013-04-05 DIAGNOSIS — Z3202 Encounter for pregnancy test, result negative: Secondary | ICD-10-CM

## 2013-04-05 LAB — POCT URINALYSIS DIPSTICK
Bilirubin, UA: NEGATIVE
Blood, UA: NEGATIVE
Glucose, UA: NEGATIVE
Ketones, UA: NEGATIVE
Nitrite, UA: NEGATIVE
Spec Grav, UA: 1.015
pH, UA: 6

## 2013-04-05 LAB — POCT URINE PREGNANCY: Preg Test, Ur: NEGATIVE

## 2013-04-05 NOTE — Patient Instructions (Addendum)
Non hormonal Contraception

## 2013-04-05 NOTE — Addendum Note (Signed)
Addended by: Julaine Hua on: 04/05/2013 05:34 PM   Modules accepted: Orders

## 2013-04-05 NOTE — Progress Notes (Signed)
.   Subjective:     Dana Carpenter is a 37 y.o. female here for a routine exam.  Current complaints: she was recently diagnosed with a PE from her birth control pills. She would like to discuss a different method of contraception.  She would also like her urine checked for a UTI.  Denies and UTI symptoms.    Personal health questionnaire reviewed: yes.   Gynecologic History Patient's last menstrual period was 03/21/2013. Contraception: none Last Pap: 09/2009. Results were: normal Last mammogram: N/A  Obstetric History OB History   Grav Para Term Preterm Abortions TAB SAB Ect Mult Living                   The following portions of the patient's history were reviewed and updated as appropriate: allergies, current medications, past family history, past medical history, past social history, past surgical history and problem list.  Review of Systems Pertinent items are noted in HPI.    Objective:    General appearance: alert and no distress Breasts: normal appearance, no masses or tenderness Abdomen: normal findings: soft, non-tender Pelvic: cervix normal in appearance, external genitalia normal, no adnexal masses or tenderness, no cervical motion tenderness, uterus normal size, shape, and consistency and vagina normal without discharge    Assessment:    Healthy female exam.   Contraception Management  H/O Pulmonary Embolism      Plan:    Education reviewed: self breast exams, weight bearing exercise and Nonhormonal Contraception.    ParaGuard IUD recommended.  Hormonal contraception contraindication emphasized because of H/O PE.  F/U 1 year.

## 2013-04-06 LAB — PAP IG W/ RFLX HPV ASCU

## 2013-04-07 LAB — URINE CULTURE

## 2013-04-12 ENCOUNTER — Other Ambulatory Visit: Payer: Self-pay | Admitting: Oncology

## 2013-04-27 ENCOUNTER — Telehealth: Payer: Self-pay | Admitting: Hematology & Oncology

## 2013-04-27 NOTE — Telephone Encounter (Signed)
Pt aware of 6-18 appointment 

## 2013-05-23 ENCOUNTER — Ambulatory Visit (HOSPITAL_BASED_OUTPATIENT_CLINIC_OR_DEPARTMENT_OTHER): Payer: Managed Care, Other (non HMO) | Admitting: Hematology & Oncology

## 2013-05-23 ENCOUNTER — Ambulatory Visit: Payer: Managed Care, Other (non HMO)

## 2013-05-23 ENCOUNTER — Other Ambulatory Visit: Payer: Managed Care, Other (non HMO) | Admitting: Lab

## 2013-05-23 VITALS — BP 113/67 | HR 67 | Temp 98.1°F | Resp 16 | Ht 67.0 in | Wt 188.0 lb

## 2013-05-23 DIAGNOSIS — G35 Multiple sclerosis: Secondary | ICD-10-CM

## 2013-05-23 DIAGNOSIS — I2699 Other pulmonary embolism without acute cor pulmonale: Secondary | ICD-10-CM

## 2013-05-23 NOTE — Progress Notes (Signed)
This office note has been dictated.

## 2013-05-24 NOTE — Addendum Note (Signed)
Addended by: Cathi Roan on: 05/24/2013 09:53 AM   Modules accepted: Orders, Medications

## 2013-05-25 NOTE — Progress Notes (Signed)
CC:   Dana Carpenter, M.D.  REFERRING PHYSICIAN:  Sigmund Carpenter, M.D. [facility]  DIAGNOSIS:  Pulmonary embolism -- idiopathic.  HISTORY OF PRESENT ILLNESS:  Dana Carpenter is very charming 37 year old African American female.  She has been in good health.  She subsequently presented with some back discomfort that started early this year. Ultimately, she was found have a pulmonary embolism.  She finally had a CT angiogram done back in April.  This showed an acute pulmonary embolism with thrombus in the right lower lobe.  There is some infarct noted.  She had a small pleural effusion.  She was again admitted for overnight anticoagulation.  From what I can tell, I do not see any Dopplers that were done.  She had a hypercoagulable panel done.  It did show that she had a lupus anticoagulant.  Again, this may have been an acute phase reactant.  She had normal protein C and protein S levels.  Her __________ was normal.  She was negative for factor V Leiden mutation.  Prothrombin __________ gene mutation was also negative.  She had anticardiolipin antibodies that were negative.  Her IgM anticardiolipin antibody was minimally elevated.  Her beta-2 glycoprotein IgM was mildly elevated at 21.  Next, she was started on Xarelto.  We were subsequently asked to see her for anticoagulation recommendations with respect to duration of anticoagulation.  She has had 4 kids.  She has had no problems with her pregnancies.  She does report 2 miscarriages.  She was on low-dose estrogen control pills when she had her pulmonary embolism.  She has not had any problems with diabetes.  She does not smoke.  She has not had any travel problems or prolonged travel.  There has been no cough.  When she initially presented with this pulmonary embolism, she did have some hemoptysis.  There has been no change in bowel or bladder habits. The patient has not noticed any kind of leg swelling.  She does  report some tingling in her hands and in her left foot.  This has been going on since she had the blood clot.  She did have an echocardiogram done in April.  The echocardiogram was unremarkable.  She had normal right ventricular systolic function.  With being on the Xarelto, her monthly cycles are heavier than normal.  She was kindly referred over to [facility] by Dana Carpenter, M.D., [facility].  PAST MEDICAL HISTORY:  Pretty much unremarkable.  ALLERGIES:  No known allergies.  MEDICATIONS: 1. Xarelto 20 mg p.o. daily. 2. Trazodone 50 mg p.o. q.h.s. p.r.n.  SOCIAL HISTORY:  Negative for tobacco or alcohol use.  She has no obvious occupational exposures.  She works for a [employer].  FAMILY HISTORY:  Pretty much unremarkable.  There is no history of blood clots in the family.  There __________ history of __________ lung disease and diabetes in the family.  REVIEW OF SYSTEMS:  As stated in history of present illness.  No additional findings noted on 12-system review.  PHYSICAL EXAMINATION:  General:  This is a well-developed, well- nourished African American female in no obvious distress.  Vital Signs: Temperature 98.1, pulse 67, respiratory rate 16, blood pressure 113/67, weight is 188.  Head/Neck:  Normocephalic, atraumatic skull.  There are no ocular or oral lesions.  There are no palpable cervical or supraclavicular lymph nodes.  Lungs:  Clear bilaterally.  Cardiac: Regular rate and rhythm with a normal S1 and S2.  There  are no murmurs, rubs or bruits.  Abdomen:  Soft with good bowel sounds.  There is no palpable abdominal mass.  There is no fluid wave.  There is no palpable hepatosplenomegaly.  Back:  No tenderness over the spine, ribs or hips. Extremities:  Show no clubbing, cyanosis or edema.  I cannot palpate any obvious venous cord in her legs.  She has good range of motion of her joints.  She has good strength in her  upper and lower extremities. Neurologic:  Shows no focal neurological deficits.  LABORATORY DATA:  Not done at this visit.  IMPRESSION:  [name] is a very charming 37 year old African American female with a pulmonary embolism.  By her initial laboratory tests, she had a positive lupus anticoagulant.  Again, this could easily have been a false positive.  She also is a minimally elevated IgM anticardiolipin antibody and beta-2 glycoprotein level.  These will have to be repeated when we see her back.  I am not sure why she is having these neurological issues.  She probably needs to have a MRI of the brain, to make sure she does not have multiple sclerosis.  I also would probably consider checking her for collagen vascular disease.  She is at risk for lupus.  As such, a lupus test could probably be done when we see her back.  I also want to get Dopplers of her legs to make sure that there is nothing in the lower extremities that could have triggered her pulmonary embolism.  I think she is going to need at least a year of anticoagulation.  I tend to treat pulmonary embolisms more aggressively than lower extremity deep venous thromboses.  It will be very interesting to see if she is truly lupus anticoagulant positive.  Again, we will see her back sometime in July and retest her.  I spent an hour with her.  I gave her a prayer blanket, she is very appreciative of this.    ______________________________ Josph Macho, M.D. PRE/MEDQ  D:  05/23/2013  T:  05/24/2013  Job:  [id number]

## 2013-05-31 ENCOUNTER — Ambulatory Visit
Admission: RE | Admit: 2013-05-31 | Discharge: 2013-05-31 | Disposition: A | Discharge status: Home / Self Care | Payer: Managed Care, Other (non HMO) | Source: Ambulatory Visit | Attending: Hematology & Oncology | Admitting: Hematology & Oncology

## 2013-05-31 DIAGNOSIS — G35 Multiple sclerosis: Secondary | ICD-10-CM

## 2013-05-31 DIAGNOSIS — I2699 Other pulmonary embolism without acute cor pulmonale: Secondary | ICD-10-CM

## 2013-05-31 MED ORDER — GADOBENATE DIMEGLUMINE 529 MG/ML IV SOLN
17.0000 mL | Freq: Once | INTRAVENOUS | Status: AC | PRN
  Administered 2013-05-31: 17 mL via INTRAVENOUS

## 2013-06-05 ENCOUNTER — Telehealth: Payer: Self-pay | Admitting: *Deleted

## 2013-06-05 NOTE — Telephone Encounter (Signed)
Called patient to let her know that her MRI of the brain was normal and there were no blood clots in the legs per d.r ennever

## 2013-06-05 NOTE — Telephone Encounter (Signed)
Message copied by Anselm Jungling on Tue Jun 05, 2013 11:42 AM ------      Message from: Josph Macho      Created: Sat Jun 02, 2013 12:19 PM       Call- MRI of brain is normal.  No blood clots in legs!! Cindee Lame ------

## 2013-06-13 ENCOUNTER — Encounter (HOSPITAL_COMMUNITY): Payer: Self-pay | Admitting: Emergency Medicine

## 2013-06-13 DIAGNOSIS — Z79899 Other long term (current) drug therapy: Secondary | ICD-10-CM | POA: Insufficient documentation

## 2013-06-13 DIAGNOSIS — IMO0002 Reserved for concepts with insufficient information to code with codable children: Secondary | ICD-10-CM | POA: Insufficient documentation

## 2013-06-13 DIAGNOSIS — Z86711 Personal history of pulmonary embolism: Secondary | ICD-10-CM | POA: Insufficient documentation

## 2013-06-13 DIAGNOSIS — Z3202 Encounter for pregnancy test, result negative: Secondary | ICD-10-CM | POA: Insufficient documentation

## 2013-06-13 DIAGNOSIS — Z8619 Personal history of other infectious and parasitic diseases: Secondary | ICD-10-CM | POA: Insufficient documentation

## 2013-06-13 DIAGNOSIS — M62838 Other muscle spasm: Secondary | ICD-10-CM | POA: Insufficient documentation

## 2013-06-13 LAB — URINALYSIS, ROUTINE W REFLEX MICROSCOPIC
Ketones, ur: 15 mg/dL — AB
Leukocytes, UA: NEGATIVE
Protein, ur: NEGATIVE mg/dL
Urobilinogen, UA: 1 mg/dL (ref 0.0–1.0)

## 2013-06-13 LAB — POCT PREGNANCY, URINE: Preg Test, Ur: NEGATIVE

## 2013-06-13 NOTE — ED Notes (Signed)
PT. REPORTS LOW BACK PAIN FOR 4 DAYS , DENIES INJURY OR FALL , AMBULATORY , PAIN WORSE WITH MOVEMENT /CERTAIN POSITIONS , DENIES DYSURIA OR HEMATURIA.

## 2013-06-14 ENCOUNTER — Emergency Department (HOSPITAL_COMMUNITY)
Admission: EM | Admit: 2013-06-14 | Discharge: 2013-06-14 | Disposition: A | Discharge status: Home / Self Care | Payer: Managed Care, Other (non HMO) | Attending: Emergency Medicine | Admitting: Emergency Medicine

## 2013-06-14 DIAGNOSIS — M62830 Muscle spasm of back: Secondary | ICD-10-CM

## 2013-06-14 MED ORDER — PREDNISONE 20 MG PO TABS: 40.0000 mg | ORAL_TABLET | Freq: Every day | ORAL | Status: DC

## 2013-06-14 MED ORDER — METHOCARBAMOL 500 MG PO TABS: 500.0000 mg | ORAL_TABLET | Freq: Two times a day (BID) | ORAL | Status: DC

## 2013-06-14 MED ORDER — HYDROCODONE-ACETAMINOPHEN 5-325 MG PO TABS: 1.0000 | ORAL_TABLET | ORAL | Status: DC | PRN

## 2013-06-14 MED ORDER — OXYCODONE-ACETAMINOPHEN 5-325 MG PO TABS
2.0000 | ORAL_TABLET | Freq: Once | ORAL | Status: AC
  Administered 2013-06-14: 2 via ORAL
  Filled 2013-06-14: qty 2

## 2013-06-14 MED ORDER — METHOCARBAMOL 500 MG PO TABS
500.0000 mg | ORAL_TABLET | Freq: Once | ORAL | Status: AC
  Administered 2013-06-14: 500 mg via ORAL
  Filled 2013-06-14: qty 1

## 2013-06-14 NOTE — ED Provider Notes (Signed)
History    CSN: [id number] Arrival date & time 06/13/13  2312  First MD Initiated Contact with Patient 06/14/13 0032     Chief Complaint  Patient presents with  . Back Pain   (Consider location/radiation/quality/duration/timing/severity/associated sxs/prior Treatment) HPI Comments: Patient currently on Xarelto for PE diagnosed on 03/26/13.  Patient is a 37 y.o. female presenting with back pain. The history is provided by the patient. No language interpreter was used.  Back Pain Location:  Lumbar spine Quality:  Aching Radiates to:  Does not radiate Pain severity:  Moderate Pain is:  Same all the time Onset quality:  Gradual Duration:  3 days Timing:  Constant Progression:  Worsening Chronicity:  New Context: twisting   Context: not falling, not lifting heavy objects, not MCA, not MVA, not recent illness and not recent injury   Worsened by:  Twisting, bending and palpation Ineffective treatments: acetaminophen. Associated symptoms: no fever, no numbness and no weakness    Past Medical History  Diagnosis Date  . Strep pharyngitis   . PE (pulmonary embolism) 03/26/2013    RT LOWER LOBE   History reviewed. No pertinent past surgical history. No family history on file. History  Substance Use Topics  . Smoking status: Never Smoker   . Smokeless tobacco: Never Used  . Alcohol Use: Yes     Comment: occasional   OB History   Grav Para Term Preterm Abortions TAB SAB Ect Mult Living                 Review of Systems  Constitutional: Negative for fever.  Musculoskeletal: Positive for back pain.  Neurological: Negative for weakness and numbness.  All other systems reviewed and are negative.    Allergies  Review of patient's allergies indicates no known allergies.  Home Medications   Current Outpatient Rx  Name  Route  Sig  Dispense  Refill  . HYDROcodone-acetaminophen (NORCO/VICODIN) 5-325 MG per tablet   Oral   Take 1 tablet by mouth every 4 (four) hours as  needed for pain.   12 tablet   0   . Lactobacillus (PROBIOTIC ACIDOPHILUS PO)   Oral   Take 1 capsule by mouth 3 (three) times daily with meals.         . methocarbamol (ROBAXIN) 500 MG tablet   Oral   Take 1 tablet (500 mg total) by mouth 2 (two) times daily.   20 tablet   0   . Multiple Vitamin (MULTIVITAMIN WITH MINERALS) TABS   Oral   Take 1 tablet by mouth daily.         . Omega-3 Fatty Acids (FISH OIL) 1000 MG CAPS   Oral   Take 1,000 mg by mouth 3 (three) times daily with meals.         . predniSONE (DELTASONE) 20 MG tablet   Oral   Take 2 tablets (40 mg total) by mouth daily.   10 tablet   0   . Rivaroxaban (XARELTO) 20 MG TABS   Oral   Take 20 mg by mouth daily.         . traZODone (DESYREL) 50 MG tablet   Oral   Take 50 mg by mouth at bedtime.           BP 113/76  Pulse 75  Temp(Src) 98.1 F (36.7 C) (Oral)  Resp 16  SpO2 100%  LMP 05/29/2013 Physical Exam  Nursing note and vitals reviewed. Constitutional: She is oriented to person, place, and  time. She appears well-developed and well-nourished. No distress.  HENT:  Head: Normocephalic and atraumatic.  Mouth/Throat: Oropharynx is clear and moist. No oropharyngeal exudate.  Eyes: Conjunctivae and EOM are normal. Pupils are equal, round, and reactive to light. No scleral icterus.  Neck: Normal range of motion.  Cardiovascular: Normal rate, regular rhythm, normal heart sounds and intact distal pulses.   DP and PT pulses 2+ b/l. Capillary refill normal.  Pulmonary/Chest: Effort normal and breath sounds normal. No respiratory distress. She has no wheezes. She has no rales.  Musculoskeletal:       Cervical back: Normal.       Thoracic back: Normal.       Lumbar back: She exhibits decreased range of motion (Secondary to pain), tenderness and spasm. She exhibits no bony tenderness, no swelling, no deformity, no laceration and no pain.       Back:  No bony deformities or step-offs palpated.  Spasm appreciated of the right lumbar paraspinal muscles.  Neurological: She is alert and oriented to person, place, and time.  No sensory or motor deficits appreciated. DTRs normal and symmetric.  Skin: Skin is warm and dry. No rash noted. She is not diaphoretic. No erythema. No pallor.  Psychiatric: She has a normal mood and affect. Her behavior is normal.    ED Course  Procedures (including critical care time) Labs Reviewed  URINALYSIS, ROUTINE W REFLEX MICROSCOPIC - Abnormal; Notable for the following:    Color, Urine AMBER (*)    Specific Gravity, Urine 1.031 (*)    Ketones, ur 15 (*)    All other components within normal limits  POCT PREGNANCY, URINE   No results found.  1. Back muscle spasm    MDM  Uncomplicated back pain with spasm - Patient neurovascularly intact and ambulatory. No midline tenderness to palpation of the cervical, thoracic, or lumbar spine. Spasm appreciated of the right lumbar paraspinal muscles. Urinalysis without evidence of hematuria or infection. Urine pregnancy negative. Physical exam findings most c/w lumbar back spasm. Do not believe further work up with imaging is warranted at this time. Patient concerned that symptoms are secondary to a pulmonary embolism as her symptoms were the same prior to this diagnosis. I explained to the patient that patient is on blood thinners which is the mainstay of treatment for pulmonary embolism. I discussed this with Dr. Norlene Campbell who agrees that further workup with imaging is not necessary. Patient appropriate for d/c with orthopedic follow up. Rx for Norco, robaxin, and prednisone given. Indications for ED return discussed with patient who is agreeable to plan and reliable for follow up.    Antony Madura, [place] 06/19/13 [phone]

## 2013-06-14 NOTE — ED Notes (Signed)
PA-C at bedside 

## 2013-06-22 NOTE — ED Provider Notes (Signed)
Medical screening examination/treatment/procedure(s) were performed by non-physician practitioner and as supervising physician I was immediately available for consultation/collaboration.  Kayler Rise M Annleigh Knueppel, MD 06/22/13 1639 

## 2013-07-02 ENCOUNTER — Ambulatory Visit (HOSPITAL_BASED_OUTPATIENT_CLINIC_OR_DEPARTMENT_OTHER): Payer: Managed Care, Other (non HMO) | Admitting: Hematology & Oncology

## 2013-07-02 ENCOUNTER — Other Ambulatory Visit (HOSPITAL_BASED_OUTPATIENT_CLINIC_OR_DEPARTMENT_OTHER): Payer: Managed Care, Other (non HMO) | Admitting: Lab

## 2013-07-02 VITALS — BP 121/68 | HR 80 | Temp 97.9°F | Resp 16 | Ht 67.0 in | Wt 194.0 lb

## 2013-07-02 DIAGNOSIS — I2699 Other pulmonary embolism without acute cor pulmonale: Secondary | ICD-10-CM

## 2013-07-02 LAB — CBC WITH DIFFERENTIAL (CANCER CENTER ONLY)
BASO#: 0.1 10*3/uL (ref 0.0–0.2)
BASO%: 1.4 % (ref 0.0–2.0)
EOS%: 3.3 % (ref 0.0–7.0)
HGB: 13 g/dL (ref 11.6–15.9)
LYMPH#: 1.4 10*3/uL (ref 0.9–3.3)
MCH: 27.7 pg (ref 26.0–34.0)
MCHC: 31.8 g/dL — ABNORMAL LOW (ref 32.0–36.0)
MONO%: 5.9 % (ref 0.0–13.0)
NEUT#: 4.3 10*3/uL (ref 1.5–6.5)
NEUT%: 67.6 % (ref 39.6–80.0)
RDW: 14.9 % (ref 11.1–15.7)

## 2013-07-02 NOTE — Progress Notes (Signed)
This office note has been dictated.

## 2013-07-03 LAB — CARDIOLIPIN ANTIBODIES, IGG, IGM, IGA
Anticardiolipin IgG: 13 GPL U/mL (ref <23)
Anticardiolipin IgM: 13 MPL U/mL — ABNORMAL HIGH (ref <11)

## 2013-07-03 LAB — LUPUS ANTICOAGULANT PANEL: PTT Lupus Anticoagulant: 36 secs (ref 28.0–43.0)

## 2013-07-03 LAB — ANA: Anti Nuclear Antibody(ANA): NEGATIVE

## 2013-07-03 NOTE — Progress Notes (Signed)
CC:   Adaku Nnodi, MD  DIAGNOSIS:  Pulmonary embolism, idiopathic.  CURRENT THERAPY:  Xarelto 20 mg p.o. daily.  INTERVAL HISTORY:  Ms. Andy comes in for followup.  Her problem has been these lower back spasms.  She had an MRI done recently. Unfortunately, I do not have the results in our system yet.  She has some swelling in the left leg.  We did do a Doppler of that leg. This was on June 26th.  The Doppler was negative.  When we did her hypercoagulable studies, she did have a positive lupus anticoagulant panel.  As such, we are repeating this to see if this is a true positive.  Outside of that, everything else has been pretty much unremarkable.  She did have a minimally elevated IgM anticardiolipin antibody.  Her beta-2 glycoprotein IgM was also mildly elevated.  Again, these back spasms are bothering her more than anything else.  She is going to see an orthopedic surgeon, I think this week.  She has been in the ER because of the spasms.  She had a urinalysis done, which was unremarkable.  No x-rays were done as far as I could tell.  There has been no problem with bleeding with the Xarelto.  She has had no cough or shortness of breath.  She is working without difficulty.  PHYSICAL EXAMINATION:  General:  This is a well-developed, well- nourished African American female in no obvious distress.  Vital signs: Temperature of 97.9, pulse 80, respiratory rate 16, blood pressure 121/68.  Weight is 194.  Head and neck:  Normocephalic, atraumatic skull.  There are no ocular or oral lesions.  There are no palpable cervical or supraclavicular lymph nodes.  Lungs:  Clear bilaterally. Cardiac:  Regular rate and rhythm with a normal S1 and S2.  There are no murmurs, rubs or bruits.  Abdomen:  Soft.  She has good bowel sounds. There is no fluid wave.  There is no palpable hepatosplenomegaly.  Back: Shows no tenderness over the spine, ribs, or hips.  No paravertebral spasms are noted.   Extremities:  Show some slight nonpitting edema of the left leg.  She has good pulses in her distal extremities bilaterally.  Skin:  Shows no rashes.  LABORATORY STUDIES:  White cell count is 6.3, hemoglobin 13, hematocrit 41, platelet count is 222.  IMPRESSION:  Ms. Kriegel is a nice 37 year old African American female with a pulmonary embolism.  She presented in April.  Again, we are going to check to see if her lupus anticoagulant was truly positive.  I am not sure why she has these back spasms.  She had an MRI which, unfortunately, I do not have the results for.  Again, I believe that 1 year of anticoagulation would be appropriate for her.  I just want to be more aggressive with the pulmonary emboli than DVTs.  Of note, she does have the mildly elevated IgM anticardiolipin antibody and beta-2 glycoprotein.  These could be significant for an anticardiolipin antibody or antiphospholipid antibody syndrome.  I want to get her back in another 2 or 3 months from now.  It will be interesting to see what the MRI shows.    ______________________________ Josph Macho, M.D. PRE/MEDQ  D:  07/02/2013  T:  07/03/2013  Job:  [id number]

## 2013-07-05 ENCOUNTER — Telehealth: Payer: Self-pay | Admitting: Oncology

## 2013-07-05 NOTE — Telephone Encounter (Addendum)
Message copied by Lacie Draft on Thu Jul 05, 2013  4:32 PM ------      Message from: Arlan Organ R      Created: Wed Jul 04, 2013  9:09 PM       Call - clotting studies are normal!!!  This is good!!  Hope your back is doing better!!  Cindee Lame ------Spoke with patient.

## 2013-08-21 ENCOUNTER — Encounter: Payer: Self-pay | Admitting: Obstetrics

## 2013-08-21 ENCOUNTER — Ambulatory Visit (INDEPENDENT_AMBULATORY_CARE_PROVIDER_SITE_OTHER): Payer: Managed Care, Other (non HMO) | Admitting: Obstetrics

## 2013-08-21 VITALS — BP 140/85 | HR 83 | Temp 97.8°F | Ht 66.0 in | Wt 192.6 lb

## 2013-08-21 DIAGNOSIS — N926 Irregular menstruation, unspecified: Secondary | ICD-10-CM

## 2013-08-21 DIAGNOSIS — N939 Abnormal uterine and vaginal bleeding, unspecified: Secondary | ICD-10-CM | POA: Insufficient documentation

## 2013-08-21 HISTORY — DX: Abnormal uterine and vaginal bleeding, unspecified: N93.9

## 2013-08-21 NOTE — Progress Notes (Signed)
  Subjective:     Dana Carpenter is a 37 y.o. woman who presents for irregular menses. Patient's last menstrual period was 08/20/2013. Menarche age:  52  Periods are regular every 28-30 days, lasting 5 days. Dysmenorrhea:moderate, occurring first 1-2 days of flow. Cyclic symptoms include: breast tenderness, constipation, headache, insomnia and back pain. Current contraception: oral pills  .History of infertility: no. History of abnormal Pap smear: no.  The following portions of the patient's history were reviewed and updated as appropriate: allergies, current medications, past family history, past medical history, past social history, past surgical history and problem list.  Review of Systems Pertinent items are noted in HPI.     Objective:    No exam performed today, Consult only..    Assessment:    The patient has AUB.  Early period and feels pregnant..    Plan:    All questions answered. Will observe.  F/U in 1 month if no period.

## 2013-08-28 ENCOUNTER — Ambulatory Visit (INDEPENDENT_AMBULATORY_CARE_PROVIDER_SITE_OTHER): Payer: Managed Care, Other (non HMO) | Admitting: Advanced Practice Midwife

## 2013-08-28 ENCOUNTER — Encounter: Payer: Self-pay | Admitting: Advanced Practice Midwife

## 2013-08-28 VITALS — BP 127/84 | HR 86 | Temp 99.3°F | Ht 66.0 in | Wt 192.0 lb

## 2013-08-28 DIAGNOSIS — N898 Other specified noninflammatory disorders of vagina: Secondary | ICD-10-CM

## 2013-08-28 DIAGNOSIS — B379 Candidiasis, unspecified: Secondary | ICD-10-CM

## 2013-08-28 MED ORDER — FLUCONAZOLE 150 MG PO TABS: 150.0000 mg | ORAL_TABLET | Freq: Once | ORAL | Status: DC

## 2013-08-28 NOTE — Progress Notes (Signed)
Subjective:     Dana Carpenter is a 37 y.o. female here for a routine exam.  Current complaints: vaginal discharge. Pt states she is having an increase in discharge. Pt states the discharge is green in color. Pt denies any itching or irritation. Pt states she is also having a slight odor.    Gynecologic History Patient's last menstrual period was 08/20/2013. Contraception: condoms Sometimes   Obstetric History OB History  Gravida Para Term Preterm AB SAB TAB Ectopic Multiple Living  4 4 4       4     # Outcome Date GA Lbr Len/2nd Weight Sex Delivery Anes PTL Lv  4 TRM 2011 [redacted]w[redacted]d  7 lb (3.175 kg) M SVD   Y  3 TRM 2008 [redacted]w[redacted]d  7 lb (3.175 kg) M SVD   Y  2 TRM 1999 [redacted]w[redacted]d  7 lb (3.175 kg) M SVD   Y  1 TRM 1997 [redacted]w[redacted]d  7 lb (3.175 kg) M SVD   Y       The following portions of the patient's history were reviewed and updated as appropriate: allergies, current medications, past family history, past medical history, past social history, past surgical history and problem list.  Review of Systems  A comprehensive review of systems was negative.    Objective:    BP 127/84  Pulse 86  Temp(Src) 99.3 F (37.4 C)  Ht 5\' 6"  (1.676 m)  Wt 192 lb (87.091 kg)  BMI 31 kg/m2  LMP 08/20/2013 General appearance: alert Pelvic: cervix normal in appearance, external genitalia normal, no adnexal masses or tenderness, no cervical motion tenderness, rectovaginal septum normal, uterus normal size, shape, and consistency and vagina normal without discharge   Microscopic wet-mount exam shows hyphae.   Assessment:    Healthy female exam.  Yeast Infection Patient Active Problem List   Diagnosis Date Noted  . Abnormal uterine bleeding (AUB) 08/21/2013  . Acute pulmonary embolism 03/26/2013      Plan:  Diflucan Rx  Education reviewed: safe sex/STD prevention and Contraception. Follow up in: PRN .Marland Kitchen   Discussed risk of pregnancy w/ history or blood clot.  Patient should get clearance from  hematologist prior to [facility] administration. Will potential investigate safe options for patient. She declines tubal ligation.  Katalyn Matin CNM  20 min spent with patient greater than 80% spent in counseling and coordination of care.

## 2013-08-29 LAB — GC/CHLAMYDIA PROBE AMP: GC Probe RNA: NEGATIVE

## 2013-08-29 LAB — WET PREP BY MOLECULAR PROBE: Trichomonas vaginosis: NEGATIVE

## 2013-10-01 ENCOUNTER — Ambulatory Visit (HOSPITAL_BASED_OUTPATIENT_CLINIC_OR_DEPARTMENT_OTHER): Payer: Managed Care, Other (non HMO) | Admitting: Hematology & Oncology

## 2013-10-01 ENCOUNTER — Other Ambulatory Visit (HOSPITAL_BASED_OUTPATIENT_CLINIC_OR_DEPARTMENT_OTHER): Payer: Managed Care, Other (non HMO) | Admitting: Lab

## 2013-10-01 VITALS — BP 117/69 | HR 76 | Temp 98.1°F | Resp 14 | Ht 66.0 in | Wt 191.0 lb

## 2013-10-01 DIAGNOSIS — I2699 Other pulmonary embolism without acute cor pulmonale: Secondary | ICD-10-CM

## 2013-10-01 LAB — CBC WITH DIFFERENTIAL (CANCER CENTER ONLY)
BASO#: 0.1 10*3/uL (ref 0.0–0.2)
BASO%: 0.9 % (ref 0.0–2.0)
EOS%: 5.1 % (ref 0.0–7.0)
HGB: 13.2 g/dL (ref 11.6–15.9)
LYMPH#: 1.3 10*3/uL (ref 0.9–3.3)
MCHC: 32.5 g/dL (ref 32.0–36.0)
MONO#: 0.5 10*3/uL (ref 0.1–0.9)
NEUT#: 4.4 10*3/uL (ref 1.5–6.5)
WBC: 6.5 10*3/uL (ref 3.9–10.0)

## 2013-10-01 MED ORDER — RIVAROXABAN 20 MG PO TABS: 20.0000 mg | ORAL_TABLET | Freq: Every day | ORAL | Status: DC

## 2013-10-01 NOTE — Progress Notes (Signed)
This office note has been dictated.

## 2013-10-02 LAB — CARDIOLIPIN ANTIBODIES, IGG, IGM, IGA
Anticardiolipin IgG: 7 GPL U/mL (ref <23)
Anticardiolipin IgM: 12 MPL U/mL — ABNORMAL HIGH (ref <11)

## 2013-10-02 NOTE — Progress Notes (Signed)
CC:   Adaku Nnodi, MD  DIAGNOSIS:  Is pulmonary embolism-mildly elevated IgM anticardiolipin antibody.  CURRENT THERAPY:  Xarelto 20 mg p.o. daily-to finish April 2015.  INTERIM HISTORY:  Dana Carpenter comes in for a followup.  She feels tired. She has had no specific complaints outside of just being tired.  She thinks it may be from working quite a bit.  She has had no problems with bleeding.  She is bruising a little bit more.  I am sure this is from the Xarelto.  I told her to try some vitamin C.  This may help her a little bit.  She has had back spasms.  She had an MRI of the back.  This showed a bulging disk.  She is supposed to be doing some physical therapy, but she has not done this yet.  She has had no problems with cough or chest pain.  There has been no fever.  She has had no leg swelling.  She has gained some weight, which she is not too happy about.  PHYSICAL EXAMINATION:  General:  This is a well-developed, well- nourished African American female in no obvious distress.  Vital Signs: Show temperature of 98, pulse 76, respiratory rate 14, blood pressure 117/69.  Weight is 191 pounds.  Head and Neck Exam:  She has a normocephalic, atraumatic skull.  There are no ocular or oral lesions. There are no palpable cervical or supraclavicular lymph nodes.  Lungs: Are clear bilaterally.  Cardiac Exam:  Regular rate and rhythm with a normal S1, S2.  There are no murmurs, rubs or bruits.  Abdomen:  Is soft.  She has good bowel sounds.  There is no fluid wave.  There is no palpable abdominal mass.  There is no palpable hepatosplenomegaly. Extremities:  Show no clubbing, cyanosis or edema.  No obvious venous cord is noted in the legs.  She has good strength in her legs.  Skin Exam:  Shows a couple ecchymoses.  Neurological Exam:  No focal neurological deficits.  LABORATORY STUDIES:  White cell count is 6.5, hemoglobin 13.2, hematocrit 40.6, platelet count 172.  IMPRESSION:  Ms.  Dana Carpenter is a very nice 37 year old African American female with pulmonary embolism.  I, personally, think this is probably idiopathic.  The IgM anticardiolipin antibody is barely elevated, so I do not think this is clinically significant.  Again, she will finish her anticoagulation in April of next year.  We will plan to get her back in 3 months.  We will see how she feels at that point in time.    ______________________________ Josph Macho, M.D. PRE/MEDQ  D:  10/01/2013  T:  10/02/2013  Job:  [id number]

## 2013-10-03 LAB — LUPUS ANTICOAGULANT PANEL
DRVVT 1:1 Mix: 39.1 secs (ref <42.9)
DRVVT: 52.4 secs — ABNORMAL HIGH (ref <42.9)
Lupus Anticoagulant: NOT DETECTED
PTT Lupus Anticoagulant: 41.3 secs (ref 28.0–43.0)

## 2013-10-03 LAB — D-DIMER, QUANTITATIVE: D-Dimer, Quant: 0.33 ug/mL-FEU (ref 0.00–0.48)

## 2013-11-15 ENCOUNTER — Telehealth: Payer: Self-pay | Admitting: *Deleted

## 2013-11-19 NOTE — Telephone Encounter (Signed)
Pt called to get permission from Dr Myna Hidalgo to get a tattoo. She is on Xarelto. He initially advised her to wait until April 2015 but then instructed her to hold the Xarelto the day before and resume the day after having it done. Her 36 yr old son recently passed away and it was in honor of him. She repeated back the instructions and appreciated Dr Gustavo Lah understanding.

## 2013-11-20 ENCOUNTER — Encounter: Payer: Self-pay | Admitting: Advanced Practice Midwife

## 2013-11-20 ENCOUNTER — Ambulatory Visit (INDEPENDENT_AMBULATORY_CARE_PROVIDER_SITE_OTHER): Payer: Managed Care, Other (non HMO) | Admitting: Advanced Practice Midwife

## 2013-11-20 VITALS — BP 118/75 | HR 69 | Temp 98.2°F | Ht 66.0 in | Wt 194.0 lb

## 2013-11-20 DIAGNOSIS — Z7251 High risk heterosexual behavior: Secondary | ICD-10-CM

## 2013-11-20 DIAGNOSIS — R109 Unspecified abdominal pain: Secondary | ICD-10-CM

## 2013-11-20 DIAGNOSIS — R1031 Right lower quadrant pain: Secondary | ICD-10-CM

## 2013-11-20 LAB — POCT URINE PREGNANCY: Preg Test, Ur: NEGATIVE

## 2013-11-20 LAB — POCT URINALYSIS DIPSTICK
Bilirubin, UA: NEGATIVE
Ketones, UA: NEGATIVE
Nitrite, UA: NEGATIVE
Spec Grav, UA: 1.01
pH, UA: 7

## 2013-11-20 NOTE — Progress Notes (Signed)
Subjective:     Dana Carpenter is a 37 y.o. female here for a problem exam.  Current complaints: spotting and cramping since November 30th.  Personal health questionnaire reviewed: yes.  Patient lost her 15yo son in a car accident the end of November. She has had brown vaginal spotting since, light, changing pad twice daily. She is past due for her period. Her fiance would like more children. She is not sure that it would be best for her and reports understanding that it would be against medical advice to get pregnant at this time.  She has f/u with her PE in the new year and reports her labs are stable.   She denies risk of STI, pain, abnormal vaginal itching burning or other vaginal discharge.   She last had unprotected IC 11/07/13.    Gynecologic History Patient's last menstrual period was 10/18/2013. Contraception: none Last Pap: 04/05/2013. Results were: normal Last mammogram: N/A  Obstetric History OB History  Gravida Para Term Preterm AB SAB TAB Ectopic Multiple Living  4 4 4       4     # Outcome Date GA Lbr Len/2nd Weight Sex Delivery Anes PTL Lv  4 TRM 2011 [redacted]w[redacted]d  7 lb (3.175 kg) M SVD   Y  3 TRM 2008 [redacted]w[redacted]d  7 lb (3.175 kg) M SVD   Y  2 TRM 1999 [redacted]w[redacted]d  7 lb (3.175 kg) M SVD   Y  1 TRM 1997 [redacted]w[redacted]d  7 lb (3.175 kg) M SVD   Y       The following portions of the patient's history were reviewed and updated as appropriate: allergies, current medications, past family history, past medical history, past social history, past surgical history and problem list.  Review of Systems A comprehensive review of systems was negative.    Objective:    BP 118/75  Pulse 69  Temp(Src) 98.2 F (36.8 C)  Ht 5\' 6"  (1.676 m)  Wt 194 lb (87.998 kg)  BMI 31.33 kg/m2  LMP 10/18/2013 General appearance: alert and cooperative Skin: Skin color, texture, turgor normal. No rashes or lesions Neurologic: Grossly normal    Assessment:    Healthy female exam.  Pulmonary Embolism, currently  being managed Recent loss of son, 15yo Not using contraception Abnormal Uterine Bleeding   Plan:    Education reviewed: safe sex/STD prevention.   Encouraged birth control and to avoid getting pregnant at this time. IUD, condoms, Nexplanon, Depo Will discuss patient w/ MD for possible management of bleeding if it continues. Consider and antibiotic regimen. Discussed comfort measures for uterine cramping.  15 min spent with patient greater than 80% spent in counseling and coordination of care.   Yaron Grasse Wilson Singer CNM

## 2013-12-31 ENCOUNTER — Ambulatory Visit (HOSPITAL_BASED_OUTPATIENT_CLINIC_OR_DEPARTMENT_OTHER): Payer: Managed Care, Other (non HMO) | Admitting: Hematology & Oncology

## 2013-12-31 ENCOUNTER — Encounter: Payer: Self-pay | Admitting: Hematology & Oncology

## 2013-12-31 ENCOUNTER — Other Ambulatory Visit (HOSPITAL_BASED_OUTPATIENT_CLINIC_OR_DEPARTMENT_OTHER): Payer: Managed Care, Other (non HMO) | Admitting: Lab

## 2013-12-31 VITALS — BP 115/74 | HR 65 | Temp 98.1°F | Resp 14 | Ht 66.0 in | Wt 193.0 lb

## 2013-12-31 DIAGNOSIS — I2699 Other pulmonary embolism without acute cor pulmonale: Secondary | ICD-10-CM

## 2013-12-31 LAB — CBC WITH DIFFERENTIAL (CANCER CENTER ONLY)
BASO#: 0 10*3/uL (ref 0.0–0.2)
BASO%: 0.5 % (ref 0.0–2.0)
EOS%: 5.2 % (ref 0.0–7.0)
Eosinophils Absolute: 0.4 10*3/uL (ref 0.0–0.5)
HCT: 40 % (ref 34.8–46.6)
HGB: 12.6 g/dL (ref 11.6–15.9)
LYMPH#: 1.7 10*3/uL (ref 0.9–3.3)
LYMPH%: 21.6 % (ref 14.0–48.0)
MCH: 27.5 pg (ref 26.0–34.0)
MCHC: 31.5 g/dL — AB (ref 32.0–36.0)
MCV: 87 fL (ref 81–101)
MONO#: 0.6 10*3/uL (ref 0.1–0.9)
MONO%: 7.4 % (ref 0.0–13.0)
NEUT%: 65.3 % (ref 39.6–80.0)
NEUTROS ABS: 5.2 10*3/uL (ref 1.5–6.5)
PLATELETS: 255 10*3/uL (ref 145–400)
RBC: 4.58 10*6/uL (ref 3.70–5.32)
RDW: 14.1 % (ref 11.1–15.7)
WBC: 8 10*3/uL (ref 3.9–10.0)

## 2014-01-01 NOTE — Progress Notes (Signed)
This office note has been dictated.

## 2014-01-02 LAB — D-DIMER, QUANTITATIVE (NOT AT ARMC): D DIMER QUANT: 0.27 ug{FEU}/mL (ref 0.00–0.48)

## 2014-01-02 NOTE — Progress Notes (Signed)
CC:   Dana Nnodi, MD  DIAGNOSES: 1. Pulmonary embolism. 2. Mildly elevated IgM anticardiolipin antibody.  CURRENT THERAPY:  Xarelto 20 mg p.o. daily.  INTERIM HISTORY:  Dana Carpenter comes in for a followup.  She is having a tough time right now.  One of her children unfortunately died in a car accident over the holidays.  This was incredibly difficult to get through for her.  She was seeing a lot of counseling right now.  I certainly can understand this.  We will definitely pray hard for her.  Otherwise, currently she is doing well with respect to the Xarelto.  She has had no problems with cough or shortness of breath.  There has been no bleeding with the Xarelto.  She has had no abdominal pain.  There has been no leg swelling or pain.  She has had no rashes.  She has had no headache.  PHYSICAL EXAMINATION:  General:  This is a fairly well-developed, well- nourished African American female in no obvious distress.  Vital Signs: Temperature of 98.1, pulse 55, respiratory rate 14, blood pressure 115/74, weight is 193 pounds.  Head and Neck:  Normocephalic, atraumatic skull.  There are no ocular or oral lesions.  There are no palpable cervical or supraclavicular lymph nodes.  Lungs:  Clear bilaterally. Cardiac:  Regular rate and rhythm with a normal S1 and S2.  She has no murmurs, rubs, or bruits.  Abdomen:  Soft.  She has good bowel sounds. There is no fluid wave.  There is no palpable abdominal mass.  There is no palpable hepatosplenomegaly.  Extremities:  No clubbing, cyanosis, or edema.  She has good range motion of her joints.  She has good strength in her legs.  There is no venous cord in her legs.  She has good pulses in the distal extremities.  Skin:  No rashes, ecchymosis, or petechia. Neurological:  No focal neurological deficits.  LABORATORY STUDIES:  White cell count 8, hemoglobin 12.6, hematocrit 40, platelet count 255.  IMPRESSION:  Dana Carpenter is very charming  38 year old African American female.  She has pulmonary embolism.  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 I am not if sure her IgM anticardiolipin antibody is of clinical significance.  She has a negative lupus anticoagulant.  We will finish treatment for a year in April.  We will plan to get her back in April.  I do not see that we have to do any scans on her for right now.  We will then get her on to aspirin.  Will have to take 2 baby aspirin a day.  I  think this would be reasonable to try to help minimize recurrence.    ______________________________ Volanda Napoleon, M.D. PRE/MEDQ  D:  01/01/2014  T:  01/02/2014  Job:  [id number]

## 2014-01-07 ENCOUNTER — Encounter: Payer: Self-pay | Admitting: Obstetrics & Gynecology

## 2014-01-07 ENCOUNTER — Ambulatory Visit (INDEPENDENT_AMBULATORY_CARE_PROVIDER_SITE_OTHER): Payer: Managed Care, Other (non HMO) | Admitting: Obstetrics & Gynecology

## 2014-01-07 VITALS — BP 129/81 | HR 78 | Temp 97.4°F | Ht 66.0 in | Wt 198.0 lb

## 2014-01-07 DIAGNOSIS — N760 Acute vaginitis: Secondary | ICD-10-CM

## 2014-01-07 NOTE — Progress Notes (Signed)
Subjective:     Dana Carpenter is a 38 y.o. female here for a routine exam.  Current complaints: vaginal irritation. Patient states she used a new bubble bath about 4 days ago. Patient states she is now having itching and burning with no odor.   Personal health questionnaire reviewed: yes.  2 Gynecologic History Patient's last menstrual period was 12/13/2013. Contraception: none   Obstetric History OB History  Gravida Para Term Preterm AB SAB TAB Ectopic Multiple Living  4 4 4       4     # Outcome Date GA Lbr Len/2nd Weight Sex Delivery Anes PTL Lv  4 TRM 2011 [redacted]w[redacted]d  7 lb (3.175 kg) M SVD   Y  3 TRM 2008 [redacted]w[redacted]d  7 lb (3.175 kg) M SVD   Y  2 TRM 1999 [redacted]w[redacted]d  7 lb (3.175 kg) M SVD   Y  1 TRM 1997 [redacted]w[redacted]d  7 lb (3.175 kg) M SVD   Y       The following portions of the patient's history were reviewed and updated as appropriate: allergies, current medications, past family history, past medical history, past social history, past surgical history and problem list.  Review of Systems Pertinent items are noted in HPI.   Objective:   Pelvic: small erythema SPEC: scant, white discharge  Assessment:   Likely atopic reaction Plan:  Avoid irritants Orders Placed This Encounter  Procedures  . WET PREP BY MOLECULAR PROBE   Return prn

## 2014-01-08 LAB — WET PREP BY MOLECULAR PROBE
Candida species: NEGATIVE
GARDNERELLA VAGINALIS: POSITIVE — AB
Trichomonas vaginosis: NEGATIVE

## 2014-01-09 ENCOUNTER — Other Ambulatory Visit: Payer: Self-pay | Admitting: *Deleted

## 2014-01-09 ENCOUNTER — Telehealth: Payer: Self-pay | Admitting: *Deleted

## 2014-01-09 MED ORDER — METRONIDAZOLE 500 MG PO TABS: 500.0000 mg | ORAL_TABLET | Freq: Two times a day (BID) | ORAL | Status: DC

## 2014-01-09 NOTE — Telephone Encounter (Signed)
Pt had called office to get lab results.  A return call was made to make pt aware of positive bacterial vaginosis.  Pt request Rx be sent to pharmacy on file.  Rx sent for Metronidazole 500mg  per protocol.

## 2014-01-10 ENCOUNTER — Encounter: Payer: Self-pay | Admitting: Hematology & Oncology

## 2014-01-17 ENCOUNTER — Encounter: Payer: Self-pay | Admitting: Hematology & Oncology

## 2014-02-26 ENCOUNTER — Encounter: Payer: Self-pay | Admitting: Advanced Practice Midwife

## 2014-02-26 ENCOUNTER — Ambulatory Visit (INDEPENDENT_AMBULATORY_CARE_PROVIDER_SITE_OTHER): Payer: Managed Care, Other (non HMO) | Admitting: Advanced Practice Midwife

## 2014-02-26 VITALS — BP 114/75 | HR 80 | Temp 97.5°F | Ht 66.0 in | Wt 201.0 lb

## 2014-02-26 DIAGNOSIS — B3731 Acute candidiasis of vulva and vagina: Secondary | ICD-10-CM

## 2014-02-26 DIAGNOSIS — B373 Candidiasis of vulva and vagina: Secondary | ICD-10-CM

## 2014-02-26 MED ORDER — FLUCONAZOLE 150 MG PO TABS: 150.0000 mg | ORAL_TABLET | Freq: Once | ORAL | Status: DC

## 2014-02-26 NOTE — Progress Notes (Signed)
Subjective:     Dana Carpenter is a 38 y.o. female here for a routine exam.  Current complaints: Pt states she would like an STD check. Patient states she has recently found out about infidelity in her relationship. Patient states she has been having a thick white discharge with a "fish" odor". Patient states she is having a slight irritation. Patient denies any itching or burning.   Personal health questionnaire reviewed: yes.  Patient completes treatment for PE within the next month.   Gynecologic History Patient's last menstrual period was 02/16/2014. Contraception: none  Obstetric History OB History  Gravida Para Term Preterm AB SAB TAB Ectopic Multiple Living  4 4 4       4     # Outcome Date GA Lbr Len/2nd Weight Sex Delivery Anes PTL Lv  4 TRM 2011 [redacted]w[redacted]d  7 lb (3.175 kg) M SVD   Y  3 TRM 2008 [redacted]w[redacted]d  7 lb (3.175 kg) M SVD   Y  2 TRM 1999 [redacted]w[redacted]d  7 lb (3.175 kg) M SVD   Y  1 TRM 1997 [redacted]w[redacted]d  7 lb (3.175 kg) M SVD   Y       The following portions of the patient's history were reviewed and updated as appropriate: allergies, current medications, past family history, past medical history, past social history, past surgical history and problem list.  Review of Systems A comprehensive review of systems was negative.    Objective:    BP 114/75  Pulse 80  Temp(Src) 97.5 F (36.4 C)  Ht 5\' 6"  (1.676 m)  Wt 201 lb (91.173 kg)  BMI 32.46 kg/m2  LMP 02/16/2014 General appearance: alert and cooperative Abdomen: soft, non-tender; bowel sounds normal; no masses,  no organomegaly Pelvic: cervix normal in appearance, external genitalia normal, no adnexal masses or tenderness, no cervical motion tenderness, positive findings: KOH positive for fungal organisms, rectovaginal septum normal, uterus normal size, shape, and consistency and vagina normal without discharge Neurologic: Alert and oriented X 3, normal strength and tone. Normal symmetric reflexes. Normal coordination and gait     Assessment:   STI Screen See history PE Recommend patient be on contraception, patient has declined Family Hx of heart disease   Plan:    Education reviewed: safe sex/STD prevention. Follow up in: PRN ..   Yeast infection, treated w/ Diflucan. GC/CT and Affirm sent and pending. Patient declined medication/ birth control. Patient declined complete STI screen, did not want blood draw.  20 min spent with patient greater than 80% spent in counseling and coordination of care.   Espn Zeman Roni Bread CNM

## 2014-02-27 ENCOUNTER — Other Ambulatory Visit: Payer: Self-pay | Admitting: Advanced Practice Midwife

## 2014-02-27 DIAGNOSIS — N760 Acute vaginitis: Principal | ICD-10-CM

## 2014-02-27 DIAGNOSIS — B9689 Other specified bacterial agents as the cause of diseases classified elsewhere: Secondary | ICD-10-CM

## 2014-02-27 LAB — WET PREP BY MOLECULAR PROBE
Candida species: NEGATIVE
Gardnerella vaginalis: POSITIVE — AB
Trichomonas vaginosis: NEGATIVE

## 2014-02-27 LAB — GC/CHLAMYDIA PROBE AMP
CT Probe RNA: NEGATIVE
GC Probe RNA: NEGATIVE

## 2014-02-27 MED ORDER — METRONIDAZOLE 500 MG PO TABS: 500.0000 mg | ORAL_TABLET | Freq: Two times a day (BID) | ORAL | Status: DC

## 2014-02-28 ENCOUNTER — Other Ambulatory Visit: Payer: Self-pay | Admitting: *Deleted

## 2014-02-28 DIAGNOSIS — N760 Acute vaginitis: Principal | ICD-10-CM

## 2014-02-28 DIAGNOSIS — B9689 Other specified bacterial agents as the cause of diseases classified elsewhere: Secondary | ICD-10-CM

## 2014-02-28 MED ORDER — METRONIDAZOLE 0.75 % VA GEL: 1.0000 | Freq: Every day | VAGINAL | Status: AC

## 2014-03-11 ENCOUNTER — Ambulatory Visit: Payer: Managed Care, Other (non HMO) | Admitting: Hematology & Oncology

## 2014-03-11 ENCOUNTER — Other Ambulatory Visit: Payer: Managed Care, Other (non HMO) | Admitting: Lab

## 2014-03-11 ENCOUNTER — Telehealth: Payer: Self-pay | Admitting: Hematology & Oncology

## 2014-03-11 NOTE — Telephone Encounter (Signed)
Pt rescheduled 4-6 to 4-24

## 2014-03-26 ENCOUNTER — Encounter (HOSPITAL_COMMUNITY): Payer: Self-pay | Admitting: Emergency Medicine

## 2014-03-26 ENCOUNTER — Emergency Department (HOSPITAL_COMMUNITY)
Admission: EM | Admit: 2014-03-26 | Discharge: 2014-03-27 | Disposition: A | Discharge status: Home / Self Care | Payer: Managed Care, Other (non HMO) | Attending: Emergency Medicine | Admitting: Emergency Medicine

## 2014-03-26 DIAGNOSIS — Z3202 Encounter for pregnancy test, result negative: Secondary | ICD-10-CM | POA: Insufficient documentation

## 2014-03-26 DIAGNOSIS — Z86711 Personal history of pulmonary embolism: Secondary | ICD-10-CM | POA: Insufficient documentation

## 2014-03-26 DIAGNOSIS — R45851 Suicidal ideations: Secondary | ICD-10-CM | POA: Insufficient documentation

## 2014-03-26 DIAGNOSIS — F4321 Adjustment disorder with depressed mood: Secondary | ICD-10-CM | POA: Diagnosis present

## 2014-03-26 DIAGNOSIS — F32A Depression, unspecified: Secondary | ICD-10-CM

## 2014-03-26 DIAGNOSIS — Z79899 Other long term (current) drug therapy: Secondary | ICD-10-CM | POA: Insufficient documentation

## 2014-03-26 DIAGNOSIS — F411 Generalized anxiety disorder: Secondary | ICD-10-CM | POA: Insufficient documentation

## 2014-03-26 DIAGNOSIS — Z7901 Long term (current) use of anticoagulants: Secondary | ICD-10-CM | POA: Insufficient documentation

## 2014-03-26 DIAGNOSIS — F329 Major depressive disorder, single episode, unspecified: Secondary | ICD-10-CM | POA: Insufficient documentation

## 2014-03-26 DIAGNOSIS — Z634 Disappearance and death of family member: Secondary | ICD-10-CM | POA: Diagnosis present

## 2014-03-26 DIAGNOSIS — Z8619 Personal history of other infectious and parasitic diseases: Secondary | ICD-10-CM | POA: Insufficient documentation

## 2014-03-26 DIAGNOSIS — G479 Sleep disorder, unspecified: Secondary | ICD-10-CM | POA: Insufficient documentation

## 2014-03-26 DIAGNOSIS — F4320 Adjustment disorder, unspecified: Secondary | ICD-10-CM | POA: Diagnosis present

## 2014-03-26 DIAGNOSIS — F3289 Other specified depressive episodes: Secondary | ICD-10-CM | POA: Insufficient documentation

## 2014-03-26 HISTORY — DX: Anxiety disorder, unspecified: F41.9

## 2014-03-26 HISTORY — DX: Depression, unspecified: F32.A

## 2014-03-26 HISTORY — DX: Major depressive disorder, single episode, unspecified: F32.9

## 2014-03-26 LAB — RAPID URINE DRUG SCREEN, HOSP PERFORMED
Amphetamines: NOT DETECTED
BARBITURATES: NOT DETECTED
Benzodiazepines: NOT DETECTED
COCAINE: NOT DETECTED
Opiates: NOT DETECTED
TETRAHYDROCANNABINOL: NOT DETECTED

## 2014-03-26 LAB — COMPREHENSIVE METABOLIC PANEL
ALBUMIN: 3.9 g/dL (ref 3.5–5.2)
ALK PHOS: 85 U/L (ref 39–117)
ALT: 17 U/L (ref 0–35)
AST: 24 U/L (ref 0–37)
BUN: 13 mg/dL (ref 6–23)
CO2: 26 mEq/L (ref 19–32)
Calcium: 9.5 mg/dL (ref 8.4–10.5)
Chloride: 101 mEq/L (ref 96–112)
Creatinine, Ser: 0.76 mg/dL (ref 0.50–1.10)
GFR calc Af Amer: >90 mL/min (ref >90)
GFR calc non Af Amer: >90 mL/min (ref >90)
Glucose, Bld: 115 mg/dL — ABNORMAL HIGH (ref 70–99)
POTASSIUM: 3.7 meq/L (ref 3.7–5.3)
SODIUM: 139 meq/L (ref 137–147)
TOTAL PROTEIN: 8.3 g/dL (ref 6.0–8.3)
Total Bilirubin: 0.8 mg/dL (ref 0.3–1.2)

## 2014-03-26 LAB — CBC
HCT: 42.4 % (ref 36.0–46.0)
Hemoglobin: 13.6 g/dL (ref 12.0–15.0)
MCH: 28.3 pg (ref 26.0–34.0)
MCHC: 32.1 g/dL (ref 30.0–36.0)
MCV: 88.1 fL (ref 78.0–100.0)
Platelets: 245 10*3/uL (ref 150–400)
RBC: 4.81 MIL/uL (ref 3.87–5.11)
RDW: 14.5 % (ref 11.5–15.5)
WBC: 7.3 10*3/uL (ref 4.0–10.5)

## 2014-03-26 LAB — PREGNANCY, URINE: Preg Test, Ur: NEGATIVE

## 2014-03-26 LAB — SALICYLATE LEVEL: Salicylate Lvl: <2 mg/dL — ABNORMAL LOW (ref 2.8–20.0)

## 2014-03-26 LAB — ACETAMINOPHEN LEVEL

## 2014-03-26 LAB — ETHANOL

## 2014-03-26 MED ORDER — ARIPIPRAZOLE 2 MG PO TABS
2.0000 mg | ORAL_TABLET | Freq: Every day | ORAL | Status: DC
  Administered 2014-03-27: 2 mg via ORAL
  Filled 2014-03-26: qty 1

## 2014-03-26 MED ORDER — ACETAMINOPHEN 325 MG PO TABS: 650.0000 mg | ORAL_TABLET | ORAL | Status: DC | PRN

## 2014-03-26 MED ORDER — LORAZEPAM 1 MG PO TABS: 1.0000 mg | ORAL_TABLET | Freq: Three times a day (TID) | ORAL | Status: DC | PRN

## 2014-03-26 MED ORDER — CLONAZEPAM 0.5 MG PO TABS
0.5000 mg | ORAL_TABLET | Freq: Two times a day (BID) | ORAL | Status: DC
  Administered 2014-03-26 – 2014-03-27 (×2): 0.5 mg via ORAL
  Filled 2014-03-26 (×2): qty 1

## 2014-03-26 MED ORDER — IBUPROFEN 200 MG PO TABS: 600.0000 mg | ORAL_TABLET | Freq: Three times a day (TID) | ORAL | Status: DC | PRN

## 2014-03-26 MED ORDER — ONDANSETRON HCL 4 MG PO TABS: 4.0000 mg | ORAL_TABLET | Freq: Three times a day (TID) | ORAL | Status: DC | PRN

## 2014-03-26 MED ORDER — VITAMIN B-12 100 MCG PO TABS
100.0000 ug | ORAL_TABLET | Freq: Every day | ORAL | Status: DC
  Administered 2014-03-27: 100 ug via ORAL
  Filled 2014-03-26: qty 1

## 2014-03-26 MED ORDER — RIVAROXABAN 20 MG PO TABS
20.0000 mg | ORAL_TABLET | Freq: Every day | ORAL | Status: DC
  Filled 2014-03-26: qty 1

## 2014-03-26 MED ORDER — ZOLPIDEM TARTRATE 5 MG PO TABS: 5.0000 mg | ORAL_TABLET | Freq: Every evening | ORAL | Status: DC | PRN

## 2014-03-26 MED ORDER — SERTRALINE HCL 50 MG PO TABS
100.0000 mg | ORAL_TABLET | Freq: Every day | ORAL | Status: DC
  Administered 2014-03-26 – 2014-03-27 (×2): 100 mg via ORAL
  Filled 2014-03-26 (×2): qty 2

## 2014-03-26 MED ORDER — SPIRONOLACTONE 25 MG PO TABS
25.0000 mg | ORAL_TABLET | Freq: Two times a day (BID) | ORAL | Status: DC
  Administered 2014-03-26 – 2014-03-27 (×2): 25 mg via ORAL
  Filled 2014-03-26 (×4): qty 1

## 2014-03-26 MED ORDER — OMEGA-3-ACID ETHYL ESTERS 1 G PO CAPS
1.0000 g | ORAL_CAPSULE | Freq: Every day | ORAL | Status: DC
  Administered 2014-03-27: 1 g via ORAL
  Filled 2014-03-26: qty 1

## 2014-03-26 NOTE — ED Provider Notes (Signed)
CSN: [id number]     Arrival date & time 03/26/14  1632 History   First MD Initiated Contact with Patient 03/26/14 1727     Chief Complaint  Patient presents with  . Medical Clearance     (Consider location/radiation/quality/duration/timing/severity/associated sxs/prior Treatment) HPI Dana Carpenter is a 38 y.o. female who presents to emergency department accompanied by her therapist, who is concerned about patient's mental wellness. Pt with depression, exacerbated by the loss of her son in a car accident 5 months ago. Pt told therapist that she is having difficulties dealing with thinks and does not want to live any longer. Pt states to me that she is very upset, she is depressed, she is hopeless, stays in bed most of her free time. States therapy is not helping. She denies any SI to me at this time, but states she has thought of it. Denies plan to me. Pt's medical problems include past PE, for which she is on xarelto. No medical complaints.   Past Medical History  Diagnosis Date  . Strep pharyngitis   . PE (pulmonary embolism) 03/26/2013    RT LOWER LOBE   Past Surgical History  Procedure Laterality Date  . No past surgeries    . Wisdom tooth extraction Bilateral    Family History  Problem Relation Age of Onset  . Breast cancer Paternal Grandmother   . Diabetes Paternal Grandfather   . Hypertension Father   . Heart disease Maternal Grandfather   . Healthy Sister     x 1  . Heart attack Mother 56    deceased   History  Substance Use Topics  . Smoking status: Never Smoker   . Smokeless tobacco: Never Used     Comment: never used product  . Alcohol Use: Yes     Comment: occasional   OB History   Grav Para Term Preterm Abortions TAB SAB Ect Mult Living   4 4 4       4      Review of Systems  Constitutional: Negative for fever and chills.  Respiratory: Negative for cough, chest tightness and shortness of breath.   Cardiovascular: Negative for chest pain, palpitations and  leg swelling.  Gastrointestinal: Negative for nausea, vomiting, abdominal pain and diarrhea.  Genitourinary: Negative for dysuria, flank pain and pelvic pain.  Musculoskeletal: Negative for arthralgias, myalgias, neck pain and neck stiffness.  Skin: Negative for rash.  Neurological: Negative for dizziness, weakness and headaches.  Psychiatric/Behavioral: Positive for suicidal ideas and sleep disturbance. The patient is nervous/anxious.   All other systems reviewed and are negative.     Allergies  Review of patient's allergies indicates no known allergies.  Home Medications   Prior to Admission medications   Medication Sig Start Date End Date Taking? Authorizing Provider  acetaminophen (TYLENOL) 325 MG tablet Take 650 mg by mouth every 6 (six) hours as needed (PAIN).   Yes Historical Provider, MD  ARIPiprazole (ABILIFY) 2 MG tablet Take 2 mg by mouth daily.   Yes Historical Provider, MD  clonazePAM (KLONOPIN) 1 MG tablet Take by mouth 2 (two) times daily.  11/20/13  Yes Historical Provider, MD  Multiple Vitamin (MULTIVITAMIN WITH MINERALS) TABS Take 1 tablet by mouth daily.   Yes Historical Provider, MD  omega-3 acid ethyl esters (LOVAZA) 1 G capsule Take 1 g by mouth daily.   Yes Historical Provider, MD  Rivaroxaban (XARELTO) 20 MG TABS tablet Take 1 tablet (20 mg total) by mouth daily. 10/01/13  Yes Rudell Cobb  Ennever, MD  sertraline (ZOLOFT) 50 MG tablet Take 100 mg by mouth daily.  11/20/13  Yes Historical Provider, MD  spironolactone (ALDACTONE) 25 MG tablet Take 25 mg by mouth 2 (two) times daily.    Yes Historical Provider, MD  vitamin B-12 (CYANOCOBALAMIN) 100 MCG tablet Take 100 mcg by mouth daily.   Yes Historical Provider, MD   BP 116/71  Pulse 72  Temp(Src) 98.2 F (36.8 C) (Oral)  Resp 18  SpO2 100%  LMP 03/12/2014 Physical Exam  Nursing note and vitals reviewed. Constitutional: She appears well-developed and well-nourished. No distress.  HENT:  Head: Normocephalic.   Eyes: Conjunctivae are normal.  Neck: Neck supple.  Cardiovascular: Normal rate, regular rhythm and normal heart sounds.   Pulmonary/Chest: Effort normal and breath sounds normal. No respiratory distress. She has no wheezes. She has no rales.  Abdominal: Soft. Bowel sounds are normal. She exhibits no distension. There is no tenderness. There is no rebound.  Musculoskeletal: She exhibits no edema.  Neurological: She is alert.  Skin: Skin is warm and dry.  Psychiatric: Her speech is normal and behavior is normal. Judgment and thought content normal. Her mood appears anxious. Cognition and memory are normal. She exhibits a depressed mood.  Pt is tearful     ED Course  Procedures (including critical care time) Labs Review Labs Reviewed  COMPREHENSIVE METABOLIC PANEL - Abnormal; Notable for the following:    Glucose, Bld 115 (*)    All other components within normal limits  SALICYLATE LEVEL - Abnormal; Notable for the following:    Salicylate Lvl <9.6 (*)    All other components within normal limits  ACETAMINOPHEN LEVEL  CBC  ETHANOL  URINE RAPID DRUG SCREEN (HOSP PERFORMED)  PREGNANCY, URINE    Imaging Review No results found.   EKG Interpretation None      MDM   Final diagnoses:  Depression    And is here with depression, suicidal thoughts, she is here voluntarily. She does believe this time, I discussed with patient that we would want her to see a psychiatrist to speak with them. Have explained to her that we'll have to take out IVC papers if she decides to leave against our advice. At this time patient stated that she will stay, and she'll speak with the psychiatrist. Clearance labs ordered. Patient is upset, tearful, however, cooperative.   Pt is medically cleared. Willing to stay to speak with psychiatrist. She is NOT IVCed, but I do not think she can be discharged if she is trying to leave until speaks to psychiatrist.   Filed Vitals:   03/26/14 1644 03/26/14 1921  03/26/14 2154  BP: 116/71 118/71 118/73  Pulse: 72 64 75  Temp: 98.2 F (36.8 C) 98.3 F (36.8 C) 98 F (36.7 C)  TempSrc: Oral Oral Oral  Resp: 18 18 18   SpO2: 100% 100% 98%          Renold Genta, PA-C 03/27/14 0116

## 2014-03-26 NOTE — ED Notes (Signed)
Therapist number Lattie [place] [phone]. Pt is voluntary, here for medical clearance. Therapist is afraid pt may have psychotic break. Pt recently had son die in car crash and after that psych issues started. Pt has been great with talking to her therapist and taking meds. Pt has been having SI but none at the moment.

## 2014-03-27 ENCOUNTER — Encounter (HOSPITAL_COMMUNITY): Payer: Self-pay | Admitting: *Deleted

## 2014-03-27 DIAGNOSIS — F4320 Adjustment disorder, unspecified: Secondary | ICD-10-CM | POA: Diagnosis present

## 2014-03-27 DIAGNOSIS — F4321 Adjustment disorder with depressed mood: Secondary | ICD-10-CM | POA: Diagnosis present

## 2014-03-27 DIAGNOSIS — Z634 Disappearance and death of family member: Secondary | ICD-10-CM

## 2014-03-27 HISTORY — DX: Adjustment disorder with depressed mood: F43.21

## 2014-03-27 NOTE — BH Assessment (Signed)
Tele Assessment Note   Dana Carpenter is a 38 y.o. female who voluntarily presents to [facility] at the request of her therapist, [facility]).  Pt states she had an appt with her 03/26/14 and she was concerned about pt.'s current mental state.  Pt says precip event is her son died on 2013-10-27 at the age of 34 after a fatal car accident.  Pt says her son and his 2 friends died when he was driving at [employer] and hit a Network engineer.  She his birthday was on [birth year] and since that time she her depression/anxiety/SI and grief have worsened.  Pt denies any current SI thoughts of harm or intent, she has 3 other children and states she would not harm herself(contracted for safety) because of them, however she is having very difficult time coping with her loss.  Pt says she has good support system with family members but continues to find day to day activities hard to handle.  Pt is tearful and speech is soft.  Pt denies hx of SI thoughts or attempts and says her mental health issues started with the death of her son.  Pt says that at times she can hear her son crying for her and see shadows, she says that her therapist has help her with night terrors. Pt endorses depressive sxs: anhedonia, decreased sleep(3hrs, daily), increased appetite(wt gain 12pds), increased anxiety with panic attacks, daily crying spells.  Pt denies SA/HI.  Pt has taken a LOA from work.   Axis I: Major depressive disorder, Recurrent episode, Severe; Anxiety disorder;  Axis II: Deferred Axis III:  Past Medical History  Diagnosis Date  . Strep pharyngitis   . PE (pulmonary embolism) 03/26/2013    RT LOWER LOBE  . Depression   . Anxiety    Axis IV: other psychosocial or environmental problems, problems related to social environment and problems with primary support group Axis V: 31-40 impairment in reality testing  Past Medical History:  Past Medical History  Diagnosis Date  . Strep pharyngitis   . PE (pulmonary embolism)  03/26/2013    RT LOWER LOBE  . Depression   . Anxiety     Past Surgical History  Procedure Laterality Date  . No past surgeries    . Wisdom tooth extraction Bilateral     Family History:  Family History  Problem Relation Age of Onset  . Breast cancer Paternal Grandmother   . Diabetes Paternal Grandfather   . Hypertension Father   . Heart disease Maternal Grandfather   . Healthy Sister     x 1  . Heart attack Mother 26    deceased    Social History:  reports that she has never smoked. She has never used smokeless tobacco. She reports that she drinks alcohol. She reports that she does not use illicit drugs.  Additional Social History:  Alcohol / Drug Use Pain Medications: See MAR  Prescriptions: See MAR  Over the Counter: See MAR  History of alcohol / drug use?: Yes Longest period of sobriety (when/how long): Pt admits she drinks occasionally   CIWA: CIWA-Ar BP: 118/73 mmHg Pulse Rate: 75 COWS:    Allergies: No Known Allergies  Home Medications:  (Not in a hospital admission)  OB/GYN Status:  Patient's last menstrual period was 03/12/2014.  General Assessment Data [facility] of Assessment: [facility] Is this a Tele or Face-to-Face Assessment?: Face-to-Face Is this an Initial Assessment or a Re-assessment for this encounter?: Initial Assessment Living Arrangements: Children;Alone (Pt  has 2 younger children in the home ) Can pt return to current living arrangement?: Yes Admission Status: Voluntary Is patient capable of signing voluntary admission?: Yes Transfer from: [facility] Referral Source: MD  Medical Screening Exam ([place]) Medical Exam completed: No Reason for MSE not completed: Other: (None )  [place] Living Arrangements: Children;Alone (Pt has 2 younger children in the home ) Name of Psychiatrist: Dr. Al[facility]  Name of Therapist: [place]   Education Status Is patient currently in school?:  No Current Grade: None  Highest grade of school patient has completed: None  Name of school: None  Contact person: None   Risk to self Suicidal Ideation: No-Not Currently/Within Last 6 Months Suicidal Intent: No-Not Currently/Within Last 6 Months Is patient at risk for suicide?: No Suicidal Plan?: No Access to Means: No What has been your use of drugs/alcohol within the last 12 months?: Pt denies---occasional use  Previous Attempts/Gestures: No How many times?: 0 Other Self Harm Risks: None  Triggers for Past Attempts: None known Intentional Self Injurious Behavior: None Family Suicide History: No Recent stressful life event(s): Trauma (Comment) (Son killed in [place] 10/2013) Persecutory voices/beliefs?: No Depression: Yes Depression Symptoms: Insomnia;Tearfulness;Isolating;Fatigue;Loss of interest in usual pleasures;Feeling worthless/self pity;Despondent Substance abuse history and/or treatment for substance abuse?: No Suicide prevention information given to non-admitted patients: Not applicable  Risk to Others Homicidal Ideation: No Thoughts of Harm to Others: No Current Homicidal Intent: No Current Homicidal Plan: No Access to Homicidal Means: No Identified Victim: None  History of harm to others?: No Assessment of Violence: None Noted Violent Behavior Description: None  Does patient have access to weapons?: No Criminal Charges Pending?: No Does patient have a court date: No  Psychosis Hallucinations: None noted Delusions: None noted  Mental Status Report Appear/Hygiene: Other (Comment) (Appropriate ) Eye Contact: Good Motor Activity: Unremarkable Speech: Logical/coherent;Soft Level of Consciousness: Alert Mood: Depressed;Anhedonia;Sad Affect: Depressed;Sad Anxiety Level: Minimal Thought Processes: Coherent;Relevant Judgement: Unimpaired Orientation: Person;Place;Time;Situation Obsessive Compulsive Thoughts/Behaviors: None  Cognitive  Functioning Concentration: Decreased Memory: Recent Intact;Remote Intact IQ: Average Insight: Fair Impulse Control: Fair Appetite: Good Weight Loss: 0 Weight Gain: 12 Sleep: Decreased Total Hours of Sleep: 3 Vegetative Symptoms: None  ADLScreening [facility] Assessment Services) Patient's cognitive ability adequate to safely complete daily activities?: Yes Patient able to express need for assistance with ADLs?: Yes Independently performs ADLs?: Yes (appropriate for developmental age)  Prior Inpatient Therapy Prior Inpatient Therapy: No Prior Therapy Dates: None  Prior Therapy Facilty/Provider(s): None  Reason for Treatment: None   Prior Outpatient Therapy Prior Outpatient Therapy: Yes Prior Therapy Dates: Current  Prior Therapy Facilty/Provider(s): Dr. Jola [facility]  Reason for Treatment: Med Mgt/Therapy   ADL Screening (condition at time of admission) Patient's cognitive ability adequate to safely complete daily activities?: Yes Is the patient deaf or have difficulty hearing?: No Does the patient have difficulty seeing, even when wearing glasses/contacts?: No Does the patient have difficulty concentrating, remembering, or making decisions?: No Patient able to express need for assistance with ADLs?: Yes Does the patient have difficulty dressing or bathing?: No Independently performs ADLs?: Yes (appropriate for developmental age) Does the patient have difficulty walking or climbing stairs?: No Weakness of Legs: None Weakness of Arms/Hands: None  Home Assistive Devices/Equipment Home Assistive Devices/Equipment: None  Therapy Consults (therapy consults require a physician order) PT Evaluation Needed: No OT Evalulation Needed: No SLP Evaluation Needed: No Abuse/Neglect Assessment (Assessment to be complete while patient is alone) Physical  Abuse: Denies Verbal Abuse: Denies Sexual Abuse: Denies Exploitation of patient/patient's resources:  Denies Self-Neglect: Denies Values / Beliefs Cultural Requests During Hospitalization: None Spiritual Requests During Hospitalization: None Consults Spiritual Care Consult Needed: No Social Work Consult Needed: No Regulatory affairs officer (For Healthcare) Advance Directive: Patient does not have advance directive;Patient would not like information Pre-existing out of facility DNR order (yellow form or pink MOST form): No Nutrition Screen- [facility] Adult/[facility] Patient's home diet: Regular  Additional Information 1:1 In Past 12 Months?: No CIRT Risk: No Elopement Risk: No Does patient have medical clearance?: Yes     Disposition:  Disposition Initial Assessment Completed for this Encounter: Yes Disposition of Patient: Referred to (AM psych eval for final disposition) Patient referred to: Other (Comment) (AM psych eval for final disposition )  [place] Rm 03/27/2014 2:43 AM

## 2014-03-27 NOTE — Consult Note (Signed)
Face to face evaluation and I agree with this note 

## 2014-03-27 NOTE — BHH Counselor (Signed)
This Probation officer spoke with Dr. Dina Rich in regards to pt contracting for safety, she is requesting psychiatrist examine pt in the morning for a final disposition.

## 2014-03-27 NOTE — ED Notes (Signed)
D/C instructions reviewed and her home meds were returned. Encouraged to to be gentle with herself, that everyone grieves in their own time and way. Encouraged to continue to talk with her therapist. Ambulatory without difficulty. Denies pain. No complaints voiced. Belongings bag x2 returned. Escorted by mental health tech to front of the hospital.

## 2014-03-27 NOTE — BHH Counselor (Signed)
Writer left voicemail for pt's therapist Kandice Moos at [facility]. Voicemail notified [facility] that pt to be d/c and pt will be at her next appt with [facility] on 04/02/14.  Arnold Long, [place] Assessment Counselor

## 2014-03-27 NOTE — Consult Note (Signed)
[facility] Face-to-Face Psychiatry Consult   Reason for Consult:  Grief over son Referring Physician:  EDP  [name] is an 38 y.o. female. 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 Strep pharyngitis   . PE (pulmonary embolism) 03/26/2013    RT LOWER LOBE  . Depression   . Anxiety    AXIS IV:  other psychosocial or environmental problems AXIS V:  51-60 moderate symptoms  Plan:  No evidence of imminent risk to self or others at present.   Supportive therapy provided about ongoing stressors. Discussed crisis plan, support from social network, calling 911, coming to the Emergency Department, and calling Suicide Hotline.  Subjective:   Dana Carpenter is a 38 y.o. female patient.  HPI:  Patient states that she came to [facility] "Because I have been feeling really down.  I lost my son and two of his friends in November (2014).  I'm having a hard time dealing with it.  His birthday was [birth year] and he would have been 38 yr old.  Yes I have 3 other children 4,6, and 18.  The 29 yr old dropped out of school because of his depression, the 38 yr old don't want to talk about him and the 38 yr old wants to talk about him all the time.  I try not to talk about him cause it is to painful.  They didn't let me see him when he was in the hospital because they said his body was to messed up. So now sometimes I can hear him crying for me just because of the way things went.  I feel like he was in pain.  I don't want to kill my self.  I want to be there for my other children.  I just want to make things better for them.  I just feel empty sometimes."  Patient also states "Today I just feel tired and want to be home with my other kids; they keep me alive." Patient states that she has a therapist Lattie Haw [facility] at the [facility] which helps her "very much".   Patient denies suicidal ideation, homicidal ideation, psychosis and paranoia.  Patient states that she sees therapist every Tuesday at 4:00 PM.  Consulted with [place]; called and left message with therapist that patient was in the hospital and would be discharged home.  Patient would need to follow up with her (therapist and psychiatrist).    HPI Elements:   Location:  Grief. Quality:  feeling empty. Severity:  depressed mood. Timing:  worsened after March 14, 2014.  Past Psychiatric History: Past Medical History  Diagnosis Date  . Strep pharyngitis   . PE (pulmonary embolism) 03/26/2013    RT LOWER LOBE  . Depression   . Anxiety     reports that she has never smoked. She has never used smokeless tobacco. She reports that she drinks alcohol. She reports that she does not use illicit drugs. Family History  Problem Relation Age of Onset  . Breast cancer Paternal Grandmother   . Diabetes Paternal Grandfather   . Hypertension Father   . Heart disease Maternal Grandfather   . Healthy Sister     x 1  . Heart attack Mother 30    deceased   Family History Substance Abuse: No Family Supports: Yes, List: (Various family  members ) Living Arrangements: Children;Alone (Pt has 2 younger children in the home ) Can pt return to current living arrangement?: Yes Abuse/Neglect [facility]) Physical Abuse: Denies Verbal Abuse: Denies Sexual Abuse: Denies Allergies:  No Known Allergies  ACT Assessment Complete:  Yes:    Educational Status    Risk to Self: Risk to self Suicidal Ideation: No-Not Currently/Within Last 6 Months Suicidal Intent: No-Not Currently/Within Last 6 Months Is patient at risk for suicide?: No Suicidal Plan?: No Access to Means: No What has been your use of drugs/alcohol within the last 12 months?: Pt denies---occasional use  Previous Attempts/Gestures: No How many times?: 0 Other Self Harm Risks: None  Triggers for Past Attempts: None known Intentional Self Injurious  Behavior: None Family Suicide History: No Recent stressful life event(s): Trauma (Comment) (Son killed in [place] 10/2013) Persecutory voices/beliefs?: No Depression: Yes Depression Symptoms: Insomnia;Tearfulness;Isolating;Fatigue;Loss of interest in usual pleasures;Feeling worthless/self pity;Despondent Substance abuse history and/or treatment for substance abuse?: No Suicide prevention information given to non-admitted patients: Not applicable  Risk to Others: Risk to Others Homicidal Ideation: No Thoughts of Harm to Others: No Current Homicidal Intent: No Current Homicidal Plan: No Access to Homicidal Means: No Identified Victim: None  History of harm to others?: No Assessment of Violence: None Noted Violent Behavior Description: None  Does patient have access to weapons?: No Criminal Charges Pending?: No Does patient have a court date: No  Abuse: Abuse/Neglect Assessment (Assessment to be complete while patient is alone) Physical Abuse: Denies Verbal Abuse: Denies Sexual Abuse: Denies Exploitation of patient/patient's resources: Denies Self-Neglect: Denies  Prior Inpatient Therapy: Prior Inpatient Therapy Prior Inpatient Therapy: No Prior Therapy Dates: None  Prior Therapy Facilty/Provider(s): None  Reason for Treatment: None   Prior Outpatient Therapy: Prior Outpatient Therapy Prior Outpatient Therapy: Yes Prior Therapy Dates: Current  Prior Therapy Facilty/Provider(s): Dr. Jola Baptist [facility]  Reason for Treatment: Med Mgt/Therapy   Additional Information: Additional Information 1:1 In Past 12 Months?: No CIRT Risk: No Elopement Risk: No Does patient have medical clearance?: Yes                  Objective: Blood pressure 115/70, pulse 85, temperature 97.8 F (36.6 C), temperature source Oral, resp. rate 18, last menstrual period 03/12/2014, SpO2 96.00%.There is no weight on file to calculate BMI. Results for orders placed during the  hospital encounter of 03/26/14 (from the past 72 hour(s))  ACETAMINOPHEN LEVEL     Status: None   Collection Time    03/26/14  5:08 PM      Result Value Ref Range   Acetaminophen (Tylenol), Serum <15.0  10 - 30 ug/mL   Comment:            THERAPEUTIC CONCENTRATIONS VARY     SIGNIFICANTLY. A RANGE OF 10-30     ug/mL MAY BE AN EFFECTIVE     CONCENTRATION FOR MANY PATIENTS.     HOWEVER, SOME ARE BEST TREATED     AT CONCENTRATIONS OUTSIDE THIS     RANGE.     ACETAMINOPHEN CONCENTRATIONS     >150 ug/mL AT 4 HOURS AFTER     INGESTION AND >50 ug/mL AT 12     HOURS AFTER INGESTION ARE     OFTEN ASSOCIATED WITH TOXIC     REACTIONS.  CBC     Status: None   Collection Time    03/26/14  5:08 PM      Result Value Ref Range   WBC 7.3  4.0 -  10.5 K/uL   RBC 4.81  3.87 - 5.11 MIL/uL   Hemoglobin 13.6  12.0 - 15.0 g/dL   HCT 42.4  36.0 - 46.0 %   MCV 88.1  78.0 - 100.0 fL   MCH 28.3  26.0 - 34.0 pg   MCHC 32.1  30.0 - 36.0 g/dL   RDW 14.5  11.5 - 15.5 %   Platelets 245  150 - 400 K/uL  COMPREHENSIVE METABOLIC PANEL     Status: Abnormal   Collection Time    03/26/14  5:08 PM      Result Value Ref Range   Sodium 139  137 - 147 mEq/L   Potassium 3.7  3.7 - 5.3 mEq/L   Chloride 101  96 - 112 mEq/L   CO2 26  19 - 32 mEq/L   Glucose, Bld 115 (*) 70 - 99 mg/dL   BUN 13  6 - 23 mg/dL   Creatinine, Ser 0.76  0.50 - 1.10 mg/dL   Calcium 9.5  8.4 - 10.5 mg/dL   Total Protein 8.3  6.0 - 8.3 g/dL   Albumin 3.9  3.5 - 5.2 g/dL   AST 24  0 - 37 U/L   ALT 17  0 - 35 U/L   Alkaline Phosphatase 85  39 - 117 U/L   Total Bilirubin 0.8  0.3 - 1.2 mg/dL   GFR calc non Af Amer >90  >90 mL/min   GFR calc Af Amer >90  >90 mL/min   Comment: (NOTE)     The eGFR has been calculated using the CKD EPI equation.     This calculation has not been validated in all clinical situations.     eGFR's persistently <90 mL/min signify possible Chronic Kidney     Disease.  ETHANOL     Status: None   Collection Time     03/26/14  5:08 PM      Result Value Ref Range   Alcohol, Ethyl (B) <11  0 - 11 mg/dL   Comment:            LOWEST DETECTABLE LIMIT FOR     SERUM ALCOHOL IS 11 mg/dL     FOR MEDICAL PURPOSES ONLY  SALICYLATE LEVEL     Status: Abnormal   Collection Time    03/26/14  5:08 PM      Result Value Ref Range   Salicylate Lvl <6.4 (*) 2.8 - 20.0 mg/dL  URINE RAPID DRUG SCREEN (HOSP PERFORMED)     Status: None   Collection Time    03/26/14  5:53 PM      Result Value Ref Range   Opiates NONE DETECTED  NONE DETECTED   Cocaine NONE DETECTED  NONE DETECTED   Benzodiazepines NONE DETECTED  NONE DETECTED   Amphetamines NONE DETECTED  NONE DETECTED   Tetrahydrocannabinol NONE DETECTED  NONE DETECTED   Barbiturates NONE DETECTED  NONE DETECTED   Comment:            DRUG SCREEN FOR MEDICAL PURPOSES     ONLY.  IF CONFIRMATION IS NEEDED     FOR ANY PURPOSE, NOTIFY LAB     WITHIN 5 DAYS.                LOWEST DETECTABLE LIMITS     FOR URINE DRUG SCREEN     Drug Class       Cutoff (ng/mL)     Amphetamine      1000  Barbiturate      200     Benzodiazepine   169     Tricyclics       678     Opiates          300     Cocaine          300     THC              50  PREGNANCY, URINE     Status: None   Collection Time    03/26/14  5:53 PM      Result Value Ref Range   Preg Test, Ur NEGATIVE  NEGATIVE   Comment:            THE SENSITIVITY OF THIS     METHODOLOGY IS >20 mIU/mL.   Labs are reviewed critical values noted.  Medications reviewed and no changes made.  Current Facility-Administered Medications  Medication Dose Route Frequency Provider Last Rate Last Dose  . acetaminophen (TYLENOL) tablet 650 mg  650 mg Oral Q4H PRN Tatyana A Kirichenko, PA-C      . ARIPiprazole (ABILIFY) tablet 2 mg  2 mg Oral Daily Tatyana A Kirichenko, PA-C   2 mg at 03/27/14 1012  . clonazePAM (KLONOPIN) tablet 0.5 mg  0.5 mg Oral BID Tatyana A Kirichenko, PA-C   0.5 mg at 03/27/14 1011  . ibuprofen  (ADVIL,MOTRIN) tablet 600 mg  600 mg Oral Q8H PRN Tatyana A Kirichenko, PA-C      . LORazepam (ATIVAN) tablet 1 mg  1 mg Oral Q8H PRN Tatyana A Kirichenko, PA-C      . omega-3 acid ethyl esters (LOVAZA) capsule 1 g  1 g Oral Daily Tatyana A Kirichenko, PA-C   1 g at 03/27/14 1011  . ondansetron (ZOFRAN) tablet 4 mg  4 mg Oral Q8H PRN Tatyana A Kirichenko, PA-C      . rivaroxaban (XARELTO) tablet 20 mg  20 mg Oral Q supper Tatyana A Kirichenko, PA-C      . sertraline (ZOLOFT) tablet 100 mg  100 mg Oral Daily Tatyana A Kirichenko, PA-C   100 mg at 03/27/14 1011  . spironolactone (ALDACTONE) tablet 25 mg  25 mg Oral BID Tatyana A Kirichenko, PA-C   25 mg at 03/27/14 9381  . vitamin B-12 (CYANOCOBALAMIN) tablet 100 mcg  100 mcg Oral Daily Tatyana A Kirichenko, PA-C   100 mcg at 03/27/14 1011  . zolpidem (AMBIEN) tablet 5 mg  5 mg Oral QHS PRN Tatyana A Kirichenko, PA-C       Current Outpatient Prescriptions  Medication Sig Dispense Refill  . acetaminophen (TYLENOL) 325 MG tablet Take 650 mg by mouth every 6 (six) hours as needed (PAIN).      Marland Kitchen ARIPiprazole (ABILIFY) 2 MG tablet Take 2 mg by mouth daily.      . clonazePAM (KLONOPIN) 1 MG tablet Take by mouth 2 (two) times daily.       . Multiple Vitamin (MULTIVITAMIN WITH MINERALS) TABS Take 1 tablet by mouth daily.      Marland Kitchen omega-3 acid ethyl esters (LOVAZA) 1 G capsule Take 1 g by mouth daily.      . Rivaroxaban (XARELTO) 20 MG TABS tablet Take 1 tablet (20 mg total) by mouth daily.  30 tablet  6  . sertraline (ZOLOFT) 50 MG tablet Take 100 mg by mouth daily.       Marland Kitchen spironolactone (ALDACTONE) 25 MG tablet Take 25 mg by mouth 2 (two) times daily.       Marland Kitchen  vitamin B-12 (CYANOCOBALAMIN) 100 MCG tablet Take 100 mcg by mouth daily.        Psychiatric Specialty Exam:     Blood pressure 115/70, pulse 85, temperature 97.8 F (36.6 C), temperature source Oral, resp. rate 18, last menstrual period 03/12/2014, SpO2 96.00%.There is no weight on file to  calculate BMI.  General Appearance: Casual  Eye Contact::  Good  Speech:  Clear and Coherent and Normal Rate  Volume:  Normal  Mood:  Depressed  Affect:  Congruent and Tearful  Thought Process:  Circumstantial, Coherent, Goal Directed and Logical  Orientation:  Full (Time, Place, and Person)  Thought Content:  "I just miss him." Grief  Suicidal Thoughts:  No  Homicidal Thoughts:  No  Memory:  Immediate;   Good Recent;   Good Remote;   Good  Judgement:  Intact  Insight:  Present  Psychomotor Activity:  Normal  Concentration:  Good  Recall:  Good  Fund of Knowledge:Good  Language: Good  Akathisia:  No  Handed:  Right  AIMS (if indicated):     Assets:  Communication Skills Desire for Improvement Housing Intimacy Physical Health Social Support Transportation  Sleep:      Musculoskeletal: Strength & Muscle Tone: within normal limits Gait & Station: normal Patient leans: N/A  Treatment Plan Summary: Follow up with Primary outpatient providers  Disposition:  Discharge home.  Patient to follow up with primary psychiatrist and therapist.    Discharge Assessment     Demographic Factors:  Female  Total Time spent with patient: 15 minutes  Psychiatric Specialty Exam: Same as above  Musculoskeletal: Same as above  Mental Status Per Nursing Assessment::   On Admission:     Current Mental Status by Physician: NA  Loss Factors: Loss of a child  Historical Factors: Patient denies suicidal/homicidal ideation, psychosis, and paranoia  Risk Reduction Factors:   Responsible for children under 30 years of age, Sense of responsibility to family, Religious beliefs about death, Living with another person, especially a relative, Positive social support and Positive therapeutic relationship  Continued Clinical Symptoms:  Grief  Cognitive Features That Contribute To Risk:  None noted    Suicide Risk:  Minimal: No identifiable suicidal ideation.  Patients presenting  with no risk factors but with morbid ruminations; may be classified as minimal risk based on the severity of the depressive symptoms  Discharge Diagnoses:  Same as above  Plan Of Care/Follow-up recommendations:  Activity:  Resume usual activity Diet:  Resume usual diet  Is patient on multiple antipsychotic therapies at discharge:  No   Has Patient had three or more failed trials of antipsychotic monotherapy by history:  No  Recommended Plan for Multiple Antipsychotic Therapies: NA  Romario Tith, FNP-BC 03/27/2014 11:22 AM

## 2014-03-27 NOTE — ED Provider Notes (Signed)
Medical screening examination/treatment/procedure(s) were performed by non-physician practitioner and as supervising physician I was immediately available for consultation/collaboration.   EKG Interpretation None        Hoy Morn, MD 03/27/14 1555

## 2014-03-28 ENCOUNTER — Telehealth: Payer: Self-pay | Admitting: Hematology & Oncology

## 2014-03-28 NOTE — Telephone Encounter (Signed)
RN aware pt was no show 4-6 and rescheduled 4-24 to 5-21

## 2014-03-29 ENCOUNTER — Other Ambulatory Visit: Payer: Managed Care, Other (non HMO) | Admitting: Lab

## 2014-03-29 ENCOUNTER — Ambulatory Visit: Payer: Managed Care, Other (non HMO) | Admitting: Hematology & Oncology

## 2014-04-25 ENCOUNTER — Ambulatory Visit (HOSPITAL_BASED_OUTPATIENT_CLINIC_OR_DEPARTMENT_OTHER): Payer: Managed Care, Other (non HMO) | Admitting: Hematology & Oncology

## 2014-04-25 ENCOUNTER — Other Ambulatory Visit (HOSPITAL_BASED_OUTPATIENT_CLINIC_OR_DEPARTMENT_OTHER): Payer: Managed Care, Other (non HMO) | Admitting: Lab

## 2014-04-25 ENCOUNTER — Encounter: Payer: Self-pay | Admitting: Hematology & Oncology

## 2014-04-25 VITALS — BP 108/66 | HR 73 | Temp 97.9°F | Resp 14 | Ht 66.0 in | Wt 210.0 lb

## 2014-04-25 DIAGNOSIS — R635 Abnormal weight gain: Secondary | ICD-10-CM

## 2014-04-25 DIAGNOSIS — Z7982 Long term (current) use of aspirin: Secondary | ICD-10-CM

## 2014-04-25 DIAGNOSIS — I2699 Other pulmonary embolism without acute cor pulmonale: Secondary | ICD-10-CM

## 2014-04-25 DIAGNOSIS — Z86711 Personal history of pulmonary embolism: Secondary | ICD-10-CM

## 2014-04-25 DIAGNOSIS — D510 Vitamin B12 deficiency anemia due to intrinsic factor deficiency: Secondary | ICD-10-CM

## 2014-04-25 LAB — CBC WITH DIFFERENTIAL (CANCER CENTER ONLY)
BASO#: 0 10*3/uL (ref 0.0–0.2)
BASO%: 0.4 % (ref 0.0–2.0)
EOS%: 2.8 % (ref 0.0–7.0)
Eosinophils Absolute: 0.3 10*3/uL (ref 0.0–0.5)
HCT: 41.2 % (ref 34.8–46.6)
HGB: 13.6 g/dL (ref 11.6–15.9)
LYMPH#: 1.6 10*3/uL (ref 0.9–3.3)
LYMPH%: 17 % (ref 14.0–48.0)
MCH: 28.5 pg (ref 26.0–34.0)
MCHC: 33 g/dL (ref 32.0–36.0)
MCV: 86 fL (ref 81–101)
MONO#: 0.6 10*3/uL (ref 0.1–0.9)
MONO%: 6.9 % (ref 0.0–13.0)
NEUT%: 72.9 % (ref 39.6–80.0)
NEUTROS ABS: 6.8 10*3/uL — AB (ref 1.5–6.5)
PLATELETS: 258 10*3/uL (ref 145–400)
RBC: 4.77 10*6/uL (ref 3.70–5.32)
RDW: 14.2 % (ref 11.1–15.7)
WBC: 9.3 10*3/uL (ref 3.9–10.0)

## 2014-04-25 LAB — BASIC METABOLIC PANEL
BUN: 24 mg/dL — ABNORMAL HIGH (ref 6–23)
CALCIUM: 10.2 mg/dL (ref 8.4–10.5)
CO2: 25 mEq/L (ref 19–32)
Chloride: 100 mEq/L (ref 96–112)
Creatinine, Ser: 1.27 mg/dL — ABNORMAL HIGH (ref 0.50–1.10)
GLUCOSE: 83 mg/dL (ref 70–99)
Potassium: 4.1 mEq/L (ref 3.5–5.3)
Sodium: 135 mEq/L (ref 135–145)

## 2014-04-25 LAB — TSH: TSH: 1.066 u[IU]/mL (ref 0.350–4.500)

## 2014-04-25 LAB — MAGNESIUM: Magnesium: 1.9 mg/dL (ref 1.5–2.5)

## 2014-04-26 ENCOUNTER — Telehealth: Payer: Self-pay | Admitting: *Deleted

## 2014-04-26 ENCOUNTER — Telehealth: Payer: Self-pay | Admitting: Hematology & Oncology

## 2014-04-26 LAB — D-DIMER, QUANTITATIVE (NOT AT ARMC): D DIMER QUANT: 0.5 ug{FEU}/mL — AB (ref 0.00–0.48)

## 2014-04-26 NOTE — Telephone Encounter (Signed)
Called pt to give June appointment phone has been d/c'd. I left message on sister's phone for her to call. I mailed schedule

## 2014-04-26 NOTE — Telephone Encounter (Signed)
Pt called asking for lab results. Informed pt of lab results.

## 2014-04-26 NOTE — Progress Notes (Signed)
Hematology and Oncology Follow Up Visit  Dana Carpenter [id number] 30-Nov-1976 38 y.o. 04/26/2014   Principle Diagnosis:   Pulmonary embolism  IgM anticardiolipin antibody positive-low titer  Current Therapy:    Patient to complete 1 year of Xarelto.  Aspirin 162 mg by mouth daily     Interim History:  [name] is in for followup. She has been on Xarelto for over a year. We'll go ahead and stop this. She has a low-level IgM anti-cardiolipin antibody. We are repeating this.  Her complaint now is that she has numbness in her feet bilaterally. This is in the middle of her feet. This has been going on for 3 weeks. She's gained a lot of weight. Pressure still not sleeping well. I will go ahead and check a thyroid function on her. I am not sure she sees her family doctor or not.  She's had headaches. She has migraines.  She still trying to deal with the tragic death of her son who died in in a car accident this past winter. This is "difficult for her.  Medications: Current outpatient prescriptions:acetaminophen (TYLENOL) 325 MG tablet, Take 650 mg by mouth as needed (PAIN). , Disp: , Rfl: ;  ARIPiprazole (ABILIFY) 2 MG tablet, Take 2 mg by mouth daily., Disp: , Rfl: ;  clonazePAM (KLONOPIN) 1 MG tablet, Take by mouth 2 (two) times daily. Takes 1 in am and 2 in pm, Disp: , Rfl: ;  Cyanocobalamin (VITAMIN B 12 PO), Take 1,000 Units by mouth every morning. Takes 2 in am, Disp: , Rfl:  FOLIC ACID PO, Take 734 mcg by mouth every morning. Takes 2 in am, Disp: , Rfl: ;  Omega-3 Fatty Acids (FISH OIL) 1200 MG CAPS, Take by mouth every morning. Takes 2 in am, Disp: , Rfl: ;  prazosin (MINIPRESS) 5 MG capsule, Take 5 mg by mouth at bedtime. , Disp: , Rfl: ;  Probiotic Product (PROBIOTIC DAILY PO), Take by mouth every morning., Disp: , Rfl:  Rivaroxaban (XARELTO) 20 MG TABS tablet, Take 1 tablet (20 mg total) by mouth daily., Disp: 30 tablet, Rfl: 6;  sertraline (ZOLOFT) 50 MG tablet, Take 100 mg by  mouth daily. , Disp: , Rfl: ;  spironolactone (ALDACTONE) 25 MG tablet, Take 25 mg by mouth 2 (two) times daily. , Disp: , Rfl: ;  Thiamine HCl (VITAMIN B-1) 250 MG tablet, Take 250 mg by mouth daily. Takes 2 in am, Disp: , Rfl:  Vitamin E 400 UNITS CHEW, Chew by mouth every morning. Takes 2 in am, Disp: , Rfl:   Allergies: No Known Allergies  Past Medical History, Surgical history, Social history, and Family History were reviewed and updated.  Review of Systems: As above  Physical Exam:  height is 5\' 6"  (1.676 m) and weight is 210 lb (95.255 kg). Her oral temperature is 97.9 F (36.6 C). Her blood pressure is 108/66 and her pulse is 73. Her respiration is 14.   Obese Afro-American female. Head and neck exam is no ocular or oral lesions. She is no palpable cervical or supraclavicular nodes. Lungs are clear. Cardiac exam regular in rhythm. Abdomen soft. She has no palpable fluid wave. There is a palpable liver or spleen tip. Back exam shows no tenderness over the spine ribs or hips. Extremities shows no clubbing cyanosis or edema. No palpable venous cord is noted in her legs. She has good pulses in her distal extremities. She has good range of motion of her joints. Neurological exam shows  no focal neurological deficits. She is good sensation in her feet.  Lab Results  Component Value Date   WBC 9.3 04/25/2014   HGB 13.6 04/25/2014   HCT 41.2 04/25/2014   MCV 86 04/25/2014   PLT 258 04/25/2014     Chemistry      Component Value Date/Time   NA 135 04/25/2014 1537   K 4.1 04/25/2014 1537   CL 100 04/25/2014 1537   CO2 25 04/25/2014 1537   BUN 24* 04/25/2014 1537   CREATININE 1.27* 04/25/2014 1537      Component Value Date/Time   CALCIUM 10.2 04/25/2014 1537   ALKPHOS 85 03/26/2014 1708   AST 24 03/26/2014 1708   ALT 17 03/26/2014 1708   BILITOT 0.8 03/26/2014 1708         Impression and Plan: Dana Carpenter is a 38 year old African American female. She developed a pulmonary embolism last  year. She had this back in April of last year. She has a mildly elevated IgM anticardiolipin antibody., Sure if this is clinically significant or not.  She completed a-year-old anticoagulation would Xarelto. I think this is adequate. We will go ahead and get her on aspirin.  Am not sure what is going on with the feet with the numbness. We will see what her thyroid tests show.  Her TSH actually was okay at 1.066. Her potassium level is okay. Her magnesium is also fine.  For now, I think we just have to consider a MRI of the brain. I don't think this is anything vascular with arterial flow.  I want to see her back in a month or so.   Volanda Napoleon, MD 5/22/20156:48 AM

## 2014-04-30 ENCOUNTER — Encounter: Payer: Self-pay | Admitting: *Deleted

## 2014-05-02 ENCOUNTER — Telehealth: Payer: Self-pay | Admitting: Hematology & Oncology

## 2014-05-02 NOTE — Telephone Encounter (Signed)
Per MD ok to schedule 5 week follow up instead of 4

## 2014-05-24 ENCOUNTER — Other Ambulatory Visit: Payer: Self-pay | Admitting: *Deleted

## 2014-05-24 DIAGNOSIS — I2699 Other pulmonary embolism without acute cor pulmonale: Secondary | ICD-10-CM

## 2014-05-27 ENCOUNTER — Ambulatory Visit (HOSPITAL_BASED_OUTPATIENT_CLINIC_OR_DEPARTMENT_OTHER): Payer: Managed Care, Other (non HMO) | Admitting: Hematology & Oncology

## 2014-05-27 ENCOUNTER — Other Ambulatory Visit (HOSPITAL_BASED_OUTPATIENT_CLINIC_OR_DEPARTMENT_OTHER): Payer: Managed Care, Other (non HMO) | Admitting: Lab

## 2014-05-27 ENCOUNTER — Encounter: Payer: Self-pay | Admitting: Hematology & Oncology

## 2014-05-27 VITALS — BP 107/66 | HR 70 | Temp 97.7°F | Resp 14 | Ht 66.0 in | Wt 211.0 lb

## 2014-05-27 DIAGNOSIS — I2699 Other pulmonary embolism without acute cor pulmonale: Secondary | ICD-10-CM

## 2014-05-27 DIAGNOSIS — R635 Abnormal weight gain: Secondary | ICD-10-CM

## 2014-05-27 DIAGNOSIS — D510 Vitamin B12 deficiency anemia due to intrinsic factor deficiency: Secondary | ICD-10-CM

## 2014-05-27 DIAGNOSIS — Z86711 Personal history of pulmonary embolism: Secondary | ICD-10-CM

## 2014-05-27 DIAGNOSIS — Z7982 Long term (current) use of aspirin: Secondary | ICD-10-CM

## 2014-05-27 LAB — CBC WITH DIFFERENTIAL (CANCER CENTER ONLY)
BASO#: 0 10*3/uL (ref 0.0–0.2)
BASO%: 0.3 % (ref 0.0–2.0)
EOS%: 3.5 % (ref 0.0–7.0)
Eosinophils Absolute: 0.3 10*3/uL (ref 0.0–0.5)
HCT: 39.2 % (ref 34.8–46.6)
HGB: 12.9 g/dL (ref 11.6–15.9)
LYMPH#: 1.8 10*3/uL (ref 0.9–3.3)
LYMPH%: 20.2 % (ref 14.0–48.0)
MCH: 28.6 pg (ref 26.0–34.0)
MCHC: 32.9 g/dL (ref 32.0–36.0)
MCV: 87 fL (ref 81–101)
MONO#: 0.6 10*3/uL (ref 0.1–0.9)
MONO%: 7 % (ref 0.0–13.0)
NEUT#: 6.2 10*3/uL (ref 1.5–6.5)
NEUT%: 69 % (ref 39.6–80.0)
PLATELETS: 218 10*3/uL (ref 145–400)
RBC: 4.51 10*6/uL (ref 3.70–5.32)
RDW: 14.1 % (ref 11.1–15.7)
WBC: 8.9 10*3/uL (ref 3.9–10.0)

## 2014-05-27 LAB — CHCC SATELLITE - SMEAR

## 2014-05-27 LAB — VITAMIN B12: VITAMIN B 12: 994 pg/mL — AB (ref 211–911)

## 2014-05-28 LAB — D-DIMER, QUANTITATIVE (NOT AT ARMC): D DIMER QUANT: 3.31 ug{FEU}/mL — AB (ref 0.00–0.48)

## 2014-05-29 NOTE — Progress Notes (Signed)
Hematology and Oncology Follow Up Visit  Dana Carpenter [id number] December 01, 1976 38 y.o. 05/29/2014   Principle Diagnosis:   Pulmonary embolism  IgM anticardiolipin antibody positive-low titer  Current Therapy:    Aspirin 162 mg by mouth daily     Interim History:  [name] is back for followup. She is doing better. She is now back to work. Her mother she is able to go back to work.  She's had no problem chest pain. No cough. No abdominal pain. There's been no bleeding. She is on aspirin now. She had one year of Xarelto did well with this.Marland Kitchen His been no leg swelling. She's had no rashes. She had no headache. She is still is dealing with the tragic death of her son over the Christmas holidays.  We last saw her, she was having problems with some tingling in her hands and feet. We'll check everything out. Everything looked okay. She had a TSH done. This was normal.  Medications: Current outpatient prescriptions:acetaminophen (TYLENOL) 325 MG tablet, Take 650 mg by mouth as needed (PAIN). , Disp: , Rfl: ;  ARIPiprazole (ABILIFY) 2 MG tablet, Take 2 mg by mouth daily., Disp: , Rfl: ;  clonazePAM (KLONOPIN) 1 MG tablet, Take by mouth 2 (two) times daily. Takes 1 in am and 2 in pm, Disp: , Rfl: ;  Cyanocobalamin (VITAMIN B 12 PO), Take 1,000 Units by mouth every morning. Takes 2 in am, Disp: , Rfl:  FOLIC ACID PO, Take 102 mcg by mouth every morning. Takes 2 in am, Disp: , Rfl: ;  Omega-3 Fatty Acids (FISH OIL) 1200 MG CAPS, Take by mouth every morning. Takes 2 in am, Disp: , Rfl: ;  prazosin (MINIPRESS) 5 MG capsule, Take 5 mg by mouth at bedtime. , Disp: , Rfl: ;  Probiotic Product (PROBIOTIC DAILY PO), Take by mouth every morning., Disp: , Rfl:  risperiDONE (RISPERDAL) 2 MG tablet, Take 2 mg by mouth at bedtime. Takes 1/2 tab at bedtime, Disp: , Rfl: ;  sertraline (ZOLOFT) 50 MG tablet, Take 100 mg by mouth daily. , Disp: , Rfl: ;  spironolactone (ALDACTONE) 25 MG tablet, Take 25 mg by mouth 2  (two) times daily. , Disp: , Rfl: ;  Thiamine HCl (VITAMIN B-1) 250 MG tablet, Take 250 mg by mouth daily. Takes 2 in am, Disp: , Rfl:  Vitamin E 400 UNITS CHEW, Chew by mouth every morning. Takes 2 in am, Disp: , Rfl:   Allergies: No Known Allergies  Past Medical History, Surgical history, Social history, and Family History were reviewed and updated.  Review of Systems: As above  Physical Exam:  height is 5\' 6"  (1.676 m) and weight is 211 lb (95.709 kg). Her oral temperature is 97.7 F (36.5 C). Her blood pressure is 107/66 and her pulse is 70. Her respiration is 14.   Well-developed and well-nourished African American female. Lungs are clear. Cardiac exam regular in rhythm with no murmurs rubs or bruits. Abdomen is soft. There are no bowel sounds. There are no palpable liver or spleen. Back exam no tenderness over the spine ribs or hips. Extremities shows no clubbing cyanosis or edema. No venous cord is noted in the legs. She has good range of motion of her joints. Skin exam no rashes. Neurological exam is nonfocal.  Lab Results  Component Value Date   WBC 8.9 05/27/2014   HGB 12.9 05/27/2014   HCT 39.2 05/27/2014   MCV 87 05/27/2014   PLT 218 05/27/2014  Chemistry      Component Value Date/Time   NA 135 04/25/2014 1537   K 4.1 04/25/2014 1537   CL 100 04/25/2014 1537   CO2 25 04/25/2014 1537   BUN 24* 04/25/2014 1537   CREATININE 1.27* 04/25/2014 1537      Component Value Date/Time   CALCIUM 10.2 04/25/2014 1537   ALKPHOS 85 03/26/2014 1708   AST 24 03/26/2014 1708   ALT 17 03/26/2014 1708   BILITOT 0.8 03/26/2014 1708         Impression and Plan: Dana Carpenter is 38 year old African female. She completed one year of anticoagulation. She now is on aspirin.  I don't think we have to get her back to the office unless there is a problem.  We will continue pray for her. I know she has had a very difficult situation with her son's death. She seems to be getting better.  We will go  ahead and and plan to see her if necessary.   Dana Napoleon, MD 6/24/20157:32 AM

## 2014-08-21 ENCOUNTER — Other Ambulatory Visit: Payer: Self-pay | Admitting: *Deleted

## 2014-08-21 DIAGNOSIS — B9689 Other specified bacterial agents as the cause of diseases classified elsewhere: Secondary | ICD-10-CM

## 2014-08-21 DIAGNOSIS — N760 Acute vaginitis: Principal | ICD-10-CM

## 2014-08-21 MED ORDER — METRONIDAZOLE 0.75 % VA GEL: 1.0000 | Freq: Two times a day (BID) | VAGINAL | Status: DC

## 2014-08-21 NOTE — Progress Notes (Signed)
Pt called to office requesting medication for BV. Return call to pt.  Pt states that she is having heavy fishy vaginal odor with irritation.  Pt states that she has had this around her cycle time.  Pt made aware that her symptoms could be treated per protocol. Metrogel sent to pharmacy.

## 2014-10-01 IMAGING — CR DG LUMBAR SPINE COMPLETE 4+V
5 series · 5 of 5 positions shown · non-contrast
Comparison: None.

CLINICAL DATA: Acute onset low back pain 2 days ago.  No known
injuries.

LUMBAR SPINE - COMPLETE 4+ VIEW

[t l-spine a.p.]
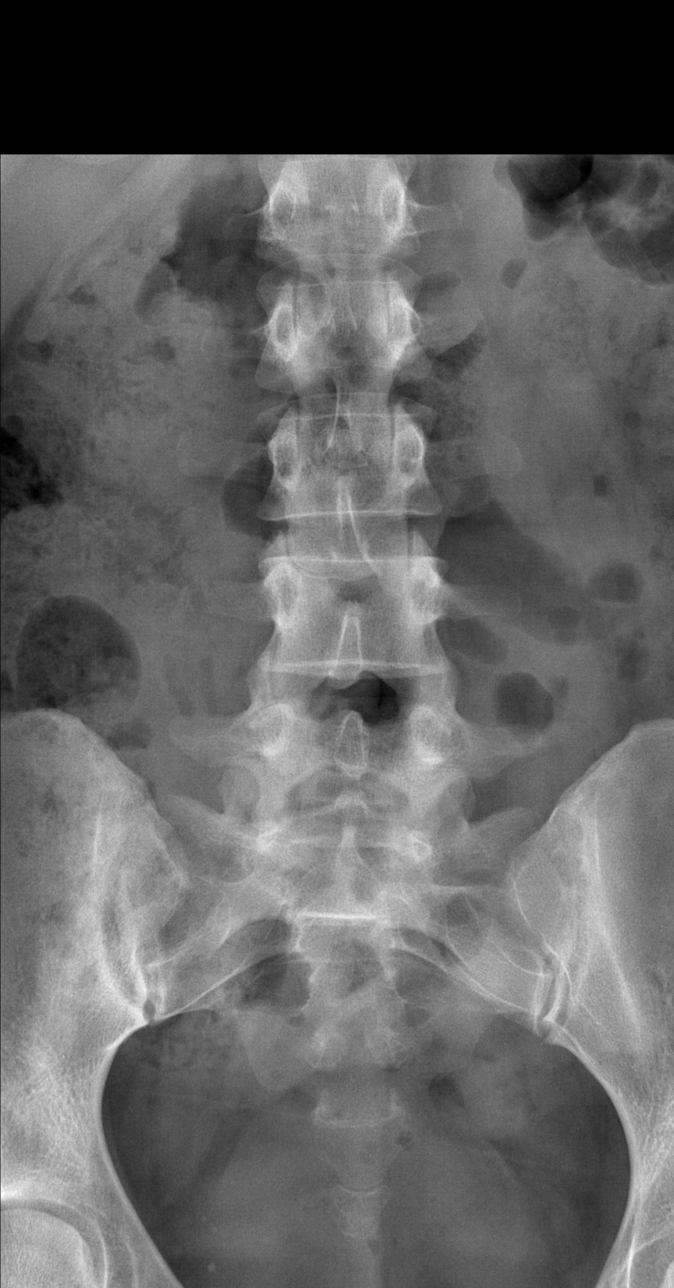

[t l-spine oblique exposure (1 of 2)]
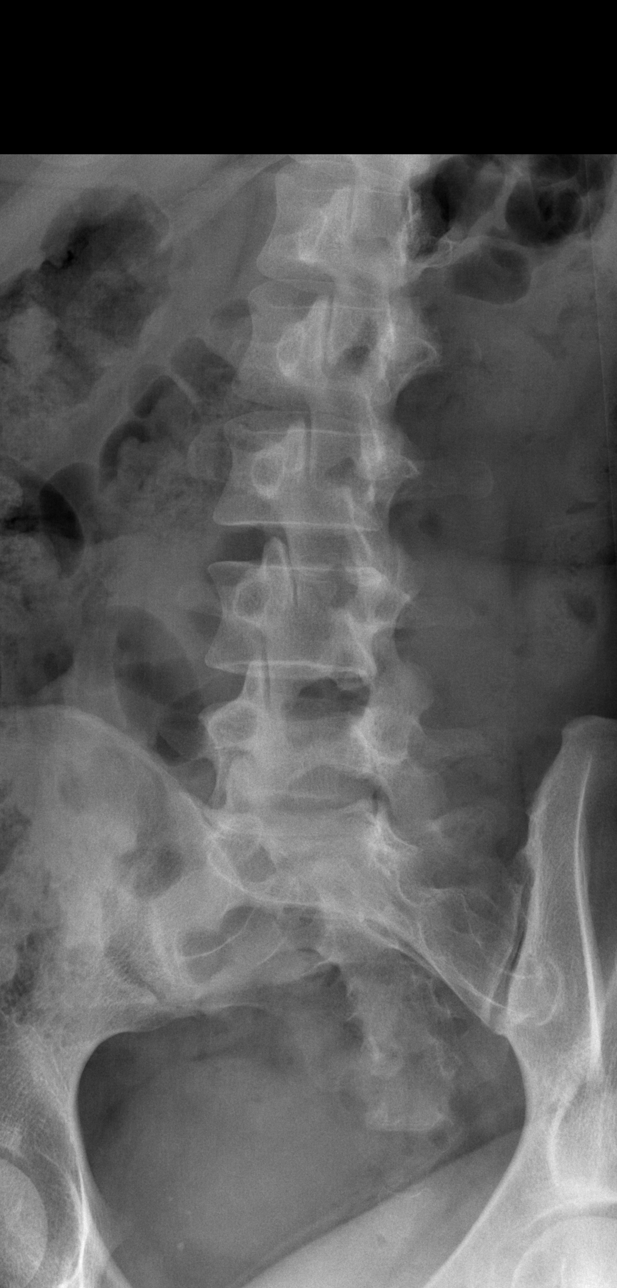

[t l-spine oblique exposure (2 of 2)]
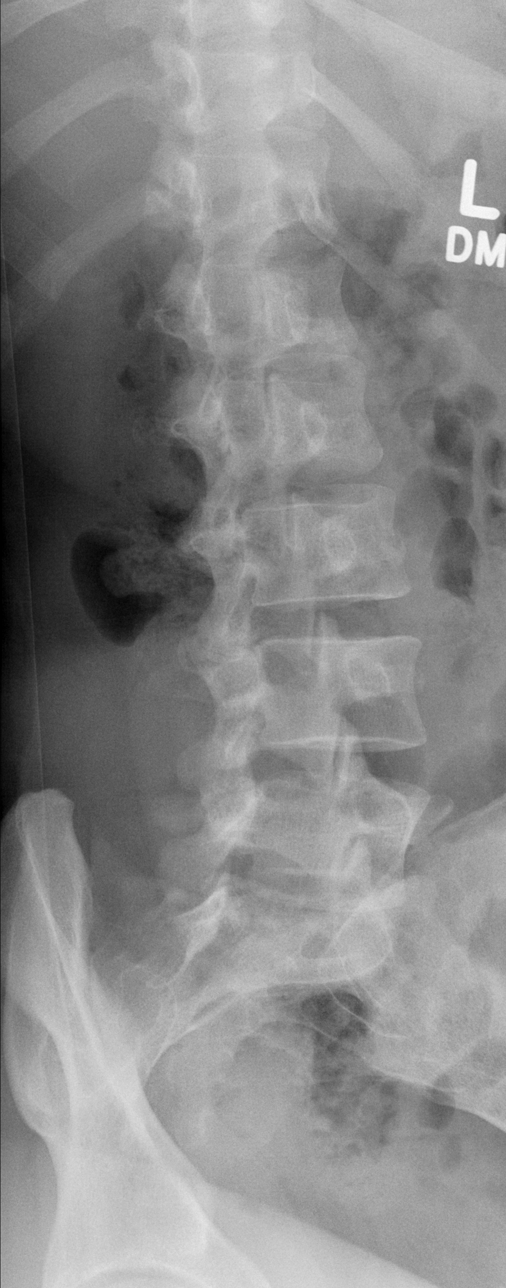

[t l-spine lat]
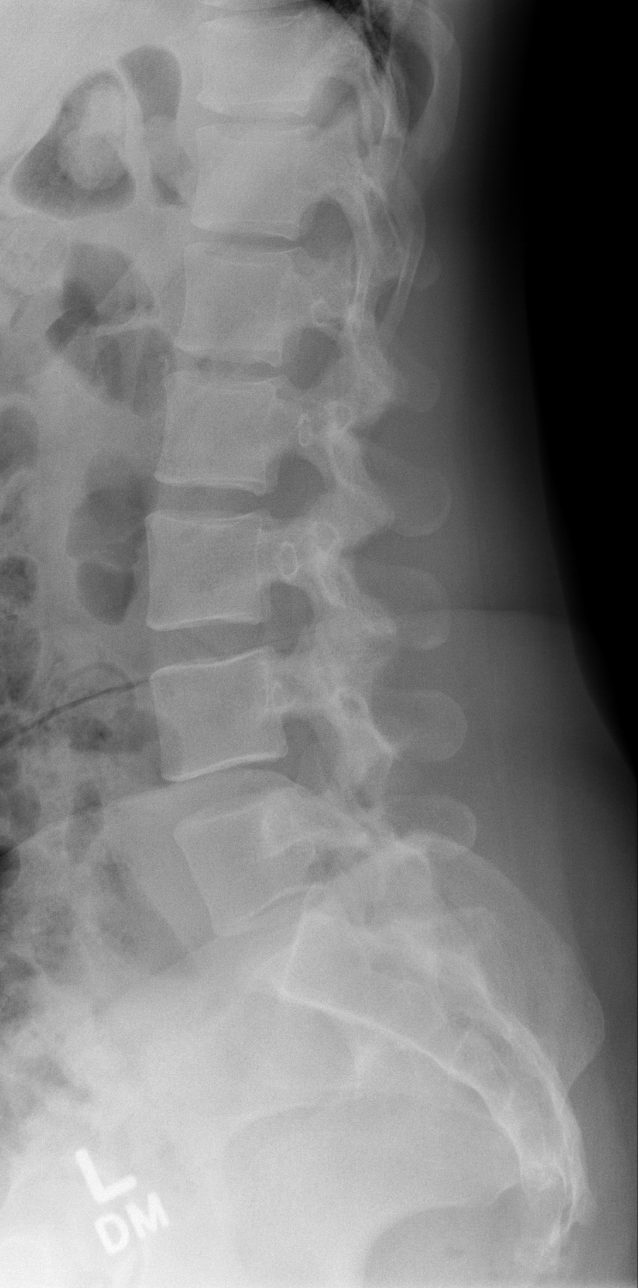

[t l-spine l5-s1 spot]
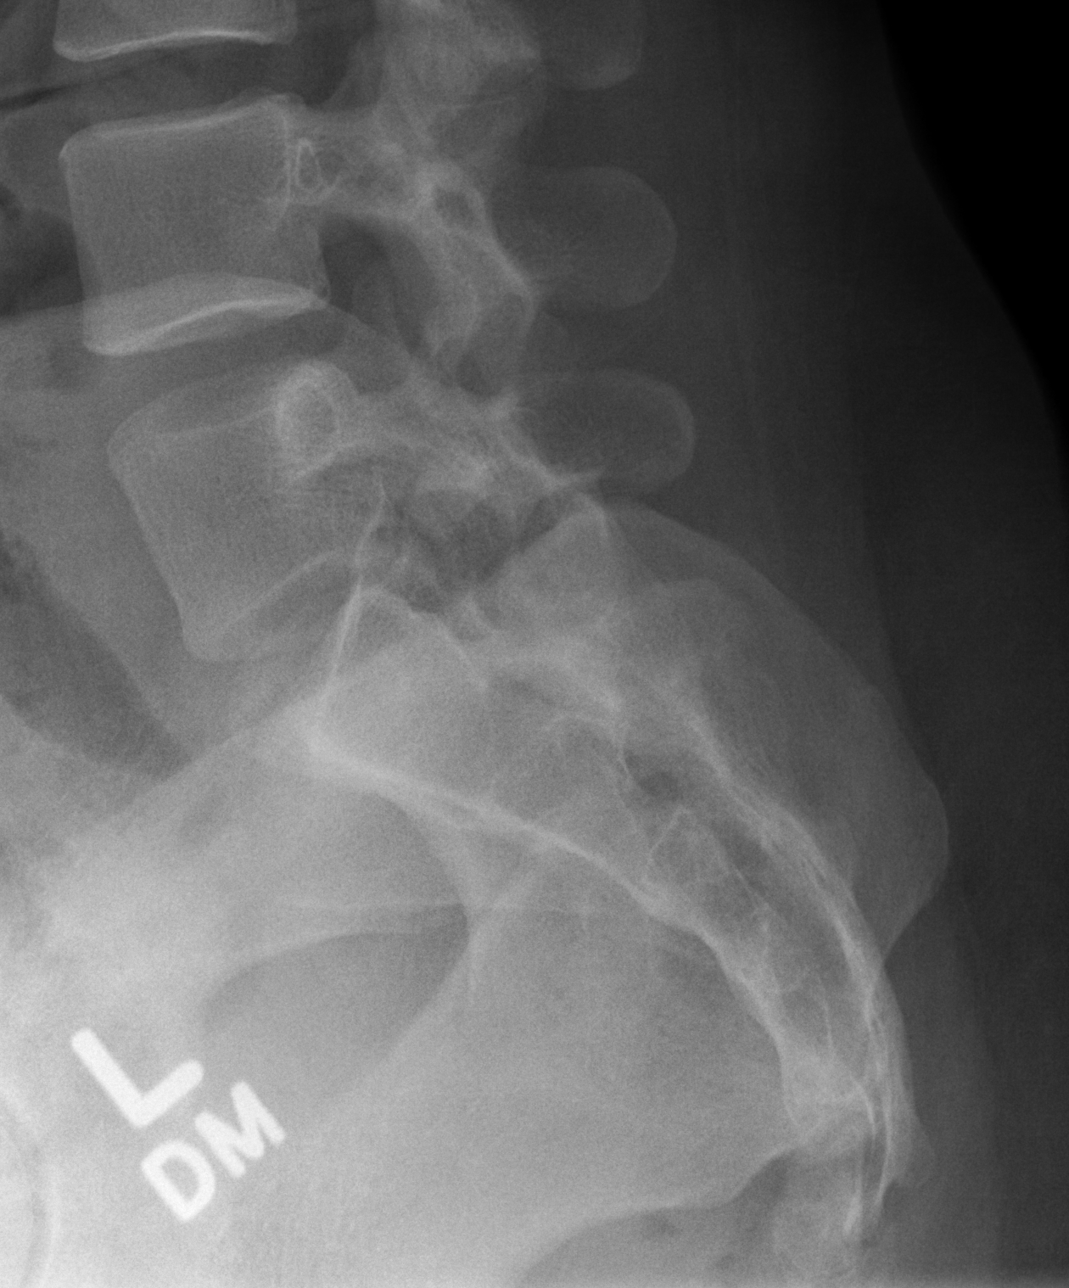

[5 of 5 positions shown; findings below may reference images not displayed]

FINDINGS: Five non-rib bearing lumbar vertebra with anatomic
alignment.  No visible fractures.  Well-preserved disc spaces.  No
pars defects.  No significant facet arthropathy.  No evidence of
spondylosis.  Visualized sacroiliac joints intact.
IMPRESSION: Normal examination.

## 2014-10-07 ENCOUNTER — Encounter: Payer: Self-pay | Admitting: Hematology & Oncology

## 2014-11-02 ENCOUNTER — Emergency Department (HOSPITAL_BASED_OUTPATIENT_CLINIC_OR_DEPARTMENT_OTHER): Payer: Managed Care, Other (non HMO)

## 2014-11-02 ENCOUNTER — Emergency Department (HOSPITAL_BASED_OUTPATIENT_CLINIC_OR_DEPARTMENT_OTHER)
Admission: EM | Admit: 2014-11-02 | Discharge: 2014-11-02 | Disposition: A | Discharge status: Home / Self Care | Payer: Managed Care, Other (non HMO) | Attending: Emergency Medicine | Admitting: Emergency Medicine

## 2014-11-02 ENCOUNTER — Encounter (HOSPITAL_BASED_OUTPATIENT_CLINIC_OR_DEPARTMENT_OTHER): Payer: Self-pay | Admitting: Emergency Medicine

## 2014-11-02 DIAGNOSIS — Z79899 Other long term (current) drug therapy: Secondary | ICD-10-CM | POA: Diagnosis not present

## 2014-11-02 DIAGNOSIS — Z792 Long term (current) use of antibiotics: Secondary | ICD-10-CM | POA: Insufficient documentation

## 2014-11-02 DIAGNOSIS — Z86711 Personal history of pulmonary embolism: Secondary | ICD-10-CM | POA: Insufficient documentation

## 2014-11-02 DIAGNOSIS — F419 Anxiety disorder, unspecified: Secondary | ICD-10-CM | POA: Insufficient documentation

## 2014-11-02 DIAGNOSIS — Z3202 Encounter for pregnancy test, result negative: Secondary | ICD-10-CM | POA: Diagnosis not present

## 2014-11-02 DIAGNOSIS — R0602 Shortness of breath: Secondary | ICD-10-CM | POA: Insufficient documentation

## 2014-11-02 DIAGNOSIS — Z8709 Personal history of other diseases of the respiratory system: Secondary | ICD-10-CM | POA: Insufficient documentation

## 2014-11-02 DIAGNOSIS — M7989 Other specified soft tissue disorders: Secondary | ICD-10-CM | POA: Insufficient documentation

## 2014-11-02 DIAGNOSIS — R609 Edema, unspecified: Secondary | ICD-10-CM

## 2014-11-02 DIAGNOSIS — F329 Major depressive disorder, single episode, unspecified: Secondary | ICD-10-CM | POA: Insufficient documentation

## 2014-11-02 DIAGNOSIS — R042 Hemoptysis: Secondary | ICD-10-CM | POA: Insufficient documentation

## 2014-11-02 DIAGNOSIS — M25442 Effusion, left hand: Secondary | ICD-10-CM

## 2014-11-02 DIAGNOSIS — K920 Hematemesis: Secondary | ICD-10-CM | POA: Diagnosis present

## 2014-11-02 LAB — CBC WITH DIFFERENTIAL/PLATELET
Basophils Absolute: 0 10*3/uL (ref 0.0–0.1)
Basophils Relative: 0 % (ref 0–1)
EOS PCT: 4 % (ref 0–5)
Eosinophils Absolute: 0.4 10*3/uL (ref 0.0–0.7)
HEMATOCRIT: 39.4 % (ref 36.0–46.0)
HEMOGLOBIN: 12.8 g/dL (ref 12.0–15.0)
Lymphocytes Relative: 17 % (ref 12–46)
Lymphs Abs: 1.5 10*3/uL (ref 0.7–4.0)
MCH: 27.8 pg (ref 26.0–34.0)
MCHC: 32.5 g/dL (ref 30.0–36.0)
MCV: 85.5 fL (ref 78.0–100.0)
MONO ABS: 0.6 10*3/uL (ref 0.1–1.0)
Monocytes Relative: 7 % (ref 3–12)
Neutro Abs: 6.1 10*3/uL (ref 1.7–7.7)
Neutrophils Relative %: 71 % (ref 43–77)
Platelets: 227 10*3/uL (ref 150–400)
RBC: 4.61 MIL/uL (ref 3.87–5.11)
RDW: 14.6 % (ref 11.5–15.5)
WBC: 8.6 10*3/uL (ref 4.0–10.5)

## 2014-11-02 LAB — PREGNANCY, URINE: Preg Test, Ur: NEGATIVE

## 2014-11-02 LAB — COMPREHENSIVE METABOLIC PANEL
ALBUMIN: 3.8 g/dL (ref 3.5–5.2)
ALT: 12 U/L (ref 0–35)
AST: 17 U/L (ref 0–37)
Alkaline Phosphatase: 77 U/L (ref 39–117)
Anion gap: 12 (ref 5–15)
BUN: 12 mg/dL (ref 6–23)
CO2: 25 mEq/L (ref 19–32)
CREATININE: 1 mg/dL (ref 0.50–1.10)
Calcium: 9.7 mg/dL (ref 8.4–10.5)
Chloride: 103 mEq/L (ref 96–112)
GFR calc Af Amer: 82 mL/min — ABNORMAL LOW (ref >90)
GFR calc non Af Amer: 71 mL/min — ABNORMAL LOW (ref >90)
Glucose, Bld: 86 mg/dL (ref 70–99)
Potassium: 3.8 mEq/L (ref 3.7–5.3)
Sodium: 140 mEq/L (ref 137–147)
Total Bilirubin: 0.7 mg/dL (ref 0.3–1.2)
Total Protein: 8 g/dL (ref 6.0–8.3)

## 2014-11-02 MED ORDER — PREDNISONE 10 MG PO TABS
40.0000 mg | ORAL_TABLET | Freq: Every day | ORAL | Status: DC
Start: 2014-11-02 — End: 2014-11-27

## 2014-11-02 MED ORDER — IOHEXOL 350 MG/ML SOLN
100.0000 mL | Freq: Once | INTRAVENOUS | Status: AC | PRN
Start: 2014-11-02 — End: 2014-11-02
  Administered 2014-11-02: 100 mL via INTRAVENOUS

## 2014-11-02 MED ORDER — HYDROCODONE-ACETAMINOPHEN 5-325 MG PO TABS
1.0000 | ORAL_TABLET | Freq: Once | ORAL | Status: AC
  Administered 2014-11-02: 1 via ORAL
  Filled 2014-11-02: qty 1

## 2014-11-02 MED ORDER — ONDANSETRON HCL 4 MG/2ML IJ SOLN
4.0000 mg | Freq: Once | INTRAMUSCULAR | Status: AC
  Administered 2014-11-02: 4 mg via INTRAVENOUS
  Filled 2014-11-02: qty 2

## 2014-11-02 NOTE — Discharge Instructions (Signed)
Your CT scan did not show any evidence of blood clot.  There is a small amount of fluid on your right lung that needs to be followed up by your family doctor.    Cough, Adult  A cough is a reflex that helps clear your throat and airways. It can help heal the body or may be a reaction to an irritated airway. A cough may only last 2 or 3 weeks (acute) or may last more than 8 weeks (chronic).  CAUSES Acute cough:  Viral or bacterial infections. Chronic cough:  Infections.  Allergies.  Asthma.  Post-nasal drip.  Smoking.  Heartburn or acid reflux.  Some medicines.  Chronic lung problems (COPD).  Cancer. SYMPTOMS   Cough.  Fever.  Chest pain.  Increased breathing rate.  High-pitched whistling sound when breathing (wheezing).  Colored mucus that you cough up (sputum). TREATMENT   A bacterial cough may be treated with antibiotic medicine.  A viral cough must run its course and will not respond to antibiotics.  Your caregiver may recommend other treatments if you have a chronic cough. HOME CARE INSTRUCTIONS   Only take over-the-counter or prescription medicines for pain, discomfort, or fever as directed by your caregiver. Use cough suppressants only as directed by your caregiver.  Use a cold steam vaporizer or humidifier in your bedroom or home to help loosen secretions.  Sleep in a semi-upright position if your cough is worse at night.  Rest as needed.  Stop smoking if you smoke. SEEK IMMEDIATE MEDICAL CARE IF:   You have pus in your sputum.  Your cough starts to worsen.  You cannot control your cough with suppressants and are losing sleep.  You begin coughing up blood.  You have difficulty breathing.  You develop pain which is getting worse or is uncontrolled with medicine.  You have a fever. MAKE SURE YOU:   Understand these instructions.  Will watch your condition.  Will get help right away if you are not doing well or get worse. Document  Released: 05/21/2011 Document Revised: 02/14/2012 Document Reviewed: 05/21/2011 [facility] Patient Information 2015 [place], [place]. This information is not intended to replace advice given to you by your health care provider. Make sure you discuss any questions you have with your health care provider.

## 2014-11-02 NOTE — ED Notes (Signed)
Pt presents to ED with complaints of vomiting blood for 2 days and left hand swelling without injury.

## 2014-11-02 NOTE — ED Provider Notes (Signed)
CSN: [id number]     Arrival date & time 11/02/14  1251 History  This chart was scribed for Dana Reichert, MD by Tula Nakayama, ED Scribe. This patient was seen in room MH07/MH07 and the patient's care was started at 3:54 PM.    Chief Complaint  Patient presents with  . Joint Swelling  . Hematemesis   The history is provided by the patient. No language interpreter was used.    HPI Comments: [name] is a 38 y.o. female who presents to the Emergency Department complaining of intermittent hemoptysis that started 2 days ago and stiffness and swelling to her left hand that started today. She notes dizziness, vomiting and decreased grip strength in left hand as associated symptoms. In terms of hemoptysis, pt states that blood is quarter-sized and has been both bright red and dark. She notes a history of similar symptoms that occurred in April 2014 when she was diagnosed with a PE. She stopped taking anti-coagulants 4-5 months ago. Pt states SOB and pain with breathing 2 weeks ago, but notes that symptoms are not present today. In terms of left hand swelling, pt states pain is mostly over left knuckles. She notes limited ROM of hand due to pain. Pt is right-handed and works in collections. She states that she has a history of similar symptoms, but that today's symptoms are worse.  Pt states that she has chronic left leg swelling that is unchanged today. She also notes that she has had increased anxiety lately because of the anniversary of the death of her son. She denies smoking. Pt states there is a possibility of pregnancy and that her LNMP was on 11/8. She denies history of lupus or arthritis. Pt denies hematochezia and fever as associated symptoms.  Past Medical History  Diagnosis Date  . Strep pharyngitis   . PE (pulmonary embolism) 03/26/2013    RT LOWER LOBE  . Depression   . Anxiety    Past Surgical History  Procedure Laterality Date  . No past surgeries    . Wisdom tooth extraction  Bilateral    Family History  Problem Relation Age of Onset  . Breast cancer Paternal Grandmother   . Diabetes Paternal Grandfather   . Hypertension Father   . Heart disease Maternal Grandfather   . Healthy Sister     x 1  . Heart attack Mother 78    deceased   History  Substance Use Topics  . Smoking status: Never Smoker   . Smokeless tobacco: Never Used     Comment: never used tobacco  . Alcohol Use: Yes     Comment: occasional   OB History    Gravida Para Term Preterm AB TAB SAB Ectopic Multiple Living   4 4 4       4      Review of Systems  Constitutional: Negative for fever.  Respiratory: Positive for cough.   Cardiovascular: Positive for leg swelling.  Gastrointestinal: Negative for blood in stool.  Musculoskeletal: Positive for joint swelling and arthralgias.  Neurological: Positive for dizziness. Negative for light-headedness.  All other systems reviewed and are negative.  Allergies  Review of patient's allergies indicates no known allergies.  Home Medications   Prior to Admission medications   Medication Sig Start Date End Date Taking? Authorizing Provider  acetaminophen (TYLENOL) 325 MG tablet Take 650 mg by mouth as needed (PAIN).     Historical Provider, MD  ARIPiprazole (ABILIFY) 2 MG tablet Take 2 mg by mouth daily.  Historical Provider, MD  clonazePAM (KLONOPIN) 1 MG tablet Take by mouth 2 (two) times daily. Takes 1 in am and 2 in pm 11/20/13   Historical Provider, MD  Cyanocobalamin (VITAMIN B 12 PO) Take 1,000 Units by mouth every morning. Takes 2 in am    Historical Provider, MD  FOLIC ACID PO Take 301 mcg by mouth every morning. Takes 2 in am    Historical Provider, MD  metroNIDAZOLE (METROGEL VAGINAL) 0.75 % vaginal gel Place 1 Applicatorful vaginally 2 (two) times daily. 08/21/14   Lahoma Crocker, MD  Omega-3 Fatty Acids (FISH OIL) 1200 MG CAPS Take by mouth every morning. Takes 2 in am    Historical Provider, MD  prazosin (MINIPRESS) 5 MG  capsule Take 5 mg by mouth at bedtime.  03/04/14   Historical Provider, MD  Probiotic Product (PROBIOTIC DAILY PO) Take by mouth every morning.    Historical Provider, MD  risperiDONE (RISPERDAL) 2 MG tablet Take 2 mg by mouth at bedtime. Takes 1/2 tab at bedtime 05/24/14   Historical Provider, MD  sertraline (ZOLOFT) 50 MG tablet Take 100 mg by mouth daily.  11/20/13   Historical Provider, MD  spironolactone (ALDACTONE) 25 MG tablet Take 25 mg by mouth 2 (two) times daily.     Historical Provider, MD  Thiamine HCl (VITAMIN B-1) 250 MG tablet Take 250 mg by mouth daily. Takes 2 in am    Historical Provider, MD  Vitamin E 400 UNITS CHEW Chew by mouth every morning. Takes 2 in am    Historical Provider, MD   BP 123/74 mmHg  Pulse 76  Temp(Src) 97.8 F (36.6 C) (Oral)  Resp 18  Ht 5\' 6"  (1.676 m)  Wt 210 lb (95.255 kg)  BMI 33.91 kg/m2  SpO2 100%  LMP 10/13/2014 Physical Exam  Constitutional: She is oriented to person, place, and time. She appears well-developed and well-nourished.  HENT:  Head: Normocephalic and atraumatic.  Cardiovascular: Normal rate and regular rhythm.   No murmur heard. Pulmonary/Chest: Effort normal and breath sounds normal. No respiratory distress.  Abdominal: Soft. There is no tenderness. There is no rebound and no guarding.  Musculoskeletal: She exhibits edema and tenderness.  Mild diffuse swelling to dorsum of left hand over MCPs. No erythema. 2+ radial pulses.  Neurological: She is alert and oriented to person, place, and time.  Skin: Skin is warm and dry.  Psychiatric: She has a normal mood and affect. Her behavior is normal.  Nursing note and vitals reviewed.   ED Course  Procedures (including critical care time) DIAGNOSTIC STUDIES: Oxygen Saturation is 100% on RA, normal by my interpretation.    COORDINATION OF CARE: 3:55 PM Discussed treatment plan with pt including lab work and CT chest. Pt agreed to plan.  Labs Review Labs Reviewed   COMPREHENSIVE METABOLIC PANEL - Abnormal; Notable for the following:    GFR calc non Af Amer 71 (*)    GFR calc Af Amer 82 (*)    All other components within normal limits  CBC WITH DIFFERENTIAL  PREGNANCY, URINE    Imaging Review Ct Angio Chest Pe W/cm &/or Wo Cm  11/02/2014   CLINICAL DATA:  Hemoptysis and hematemesis. Left hand swelling and pain. History of pulmonary embolism in 2014.  EXAM: CT ANGIOGRAPHY CHEST WITH CONTRAST  TECHNIQUE: Multidetector CT imaging of the chest was performed using the standard protocol during bolus administration of intravenous contrast. Multiplanar CT image reconstructions and MIPs were obtained to evaluate the vascular anatomy.  CONTRAST:  120mL OMNIPAQUE IOHEXOL 350 MG/ML SOLN  COMPARISON:  03/30/2013.  FINDINGS: Normally opacified pulmonary arteries with no pulmonary arterial filling defects seen. Minimal bilateral subpleural atelectasis or scarring. Minimal right pleural effusion. No lung nodules or enlarged lymph nodes. Minimal thoracic spine degenerative changes. Unremarkable upper abdomen.  Review of the MIP images confirms the above findings.  IMPRESSION: 1. No pulmonary emboli. 2. Minimal right pleural effusion.   Electronically Signed   By: Enrique Sack M.D.   On: 11/02/2014 18:12   Dg Hand Complete Left  11/02/2014   CLINICAL DATA:  Left hand pain and swelling for 2 days, no known injury  EXAM: LEFT HAND - COMPLETE 3+ VIEW  COMPARISON:  None.  FINDINGS: Three views of left hand submitted. No acute fracture or subluxation. No periosteal reaction or bony erosion. No radiopaque foreign body.  IMPRESSION: Negative.   Electronically Signed   By: Lahoma Crocker M.D.   On: 11/02/2014 13:54     EKG Interpretation None      MDM   Final diagnoses:  Cough with hemoptysis  Joint swelling, finger, hand, left    Patient here for complaints of swelling in the left hand as well as hemoptysis. In terms of swelling of the hand, question some degree of  arthralgia, will treat with versus steroids. Clinical picture not consistent with septic arthritis or gouty arthritis. In terms of hemoptysis, this is small volume per patient report, CT PE study obtained given patient's history there is no evidence of acute PE on CT scan. Discussed with patient home care as well as return precautions.  I personally performed the services described in this documentation, which was scribed in my presence. The recorded information has been reviewed and is accurate.    Dana Reichert, MD 11/03/14 [phone]

## 2014-11-07 ENCOUNTER — Encounter: Payer: Self-pay | Admitting: Family

## 2014-11-07 ENCOUNTER — Other Ambulatory Visit: Payer: Managed Care, Other (non HMO) | Admitting: Lab

## 2014-11-07 ENCOUNTER — Other Ambulatory Visit: Payer: Self-pay | Admitting: *Deleted

## 2014-11-07 ENCOUNTER — Other Ambulatory Visit: Payer: Self-pay | Admitting: Family

## 2014-11-07 ENCOUNTER — Ambulatory Visit (HOSPITAL_BASED_OUTPATIENT_CLINIC_OR_DEPARTMENT_OTHER): Payer: Managed Care, Other (non HMO) | Admitting: Family

## 2014-11-07 ENCOUNTER — Encounter: Payer: Self-pay | Admitting: *Deleted

## 2014-11-07 VITALS — BP 134/82 | HR 76 | Temp 98.4°F | Resp 18 | Wt 216.0 lb

## 2014-11-07 DIAGNOSIS — I2699 Other pulmonary embolism without acute cor pulmonale: Secondary | ICD-10-CM

## 2014-11-07 DIAGNOSIS — R21 Rash and other nonspecific skin eruption: Secondary | ICD-10-CM

## 2014-11-07 DIAGNOSIS — R609 Edema, unspecified: Secondary | ICD-10-CM

## 2014-11-07 DIAGNOSIS — D510 Vitamin B12 deficiency anemia due to intrinsic factor deficiency: Secondary | ICD-10-CM

## 2014-11-07 DIAGNOSIS — Z86711 Personal history of pulmonary embolism: Secondary | ICD-10-CM

## 2014-11-07 NOTE — Progress Notes (Signed)
[facility]  Telephone:([phone]) [phone] Fax:(336) [phone]  ID: [name] OB: Oct 13, 1976 MR#: [id number] DJM#:[id number] Patient Care Team: Gavin Pound, MD as PCP - General (Family Medicine)  DIAGNOSIS: Pulmonary embolism IgM anticardiolipin antibody positive-low titer  INTERVAL HISTORY: Ms. Kerwood is here today for follow-up. She recently went to the ED for hemoptysis and a nose bleed. She also was having swelling in her right hand and it turned "blue." She said that the swelling in her hand and leg comes and goes. She often gets a rash across her face. Her SOB is improved.  She denies fever, chills, n/v, cough, headache, dizziness, chest pain, palpitations, abdominal pain, constipation, diarrhea, blood in urine or stool. She has had no more nose bleeds or hemoptysis since the night she went to the ED.  She is taking her aspirin daily as prescribed.  No tenderness, numbness or tingling in her extremities.  Her appetite is good and she is drinking plenty of fluids. Her weight is stable.  Her ED doctor suggested she be tested for Lupus.   CURRENT TREATMENT: Aspirin 162 mg by mouth daily  REVIEW OF SYSTEMS: All other 10 point review of systems is negative.   PAST MEDICAL HISTORY: Past Medical History  Diagnosis Date  . Strep pharyngitis   . PE (pulmonary embolism) 03/26/2013    RT LOWER LOBE  . Depression   . Anxiety     PAST SURGICAL HISTORY: Past Surgical History  Procedure Laterality Date  . No past surgeries    . Wisdom tooth extraction Bilateral     FAMILY HISTORY Family History  Problem Relation Age of Onset  . Breast cancer Paternal Grandmother   . Diabetes Paternal Grandfather   . Hypertension Father   . Heart disease Maternal Grandfather   . Healthy Sister     x 1  . Heart attack Mother 75    deceased    GYNECOLOGIC HISTORY:  Patient's last menstrual period was 10/13/2014.   SOCIAL HISTORY: History   Social History  . Marital Status:  Single    Spouse Name: N/A    Number of Children: N/A  . Years of Education: N/A   Occupational History  . Not on file.   Social History Main Topics  . Smoking status: Never Smoker   . Smokeless tobacco: Never Used     Comment: never used tobacco  . Alcohol Use: Yes     Comment: occasional  . Drug Use: No  . Sexual Activity:    Partners: Male    Patent examiner Protection: None   Other Topics Concern  . Not on file   Social History Narrative    ADVANCED DIRECTIVES:  <no information>  HEALTH MAINTENANCE: History  Substance Use Topics  . Smoking status: Never Smoker   . Smokeless tobacco: Never Used     Comment: never used tobacco  . Alcohol Use: Yes     Comment: occasional   Colonoscopy: PAP: Bone density: Lipid panel:  No Known Allergies  Current Outpatient Prescriptions  Medication Sig Dispense Refill  . acetaminophen (TYLENOL) 325 MG tablet Take 650 mg by mouth as needed (PAIN).     Marland Kitchen ARIPiprazole (ABILIFY) 2 MG tablet Take 2 mg by mouth daily.    . clonazePAM (KLONOPIN) 1 MG tablet Take by mouth 2 (two) times daily. Takes 1 in am and 2 in pm    . Cyanocobalamin (VITAMIN B 12 PO) Take 1,000 Units by mouth every morning. Takes 2 in am    .  FOLIC ACID PO Take 240 mcg by mouth every morning. Takes 2 in am    . metroNIDAZOLE (METROGEL VAGINAL) 0.75 % vaginal gel Place 1 Applicatorful vaginally 2 (two) times daily. 70 g 0  . Omega-3 Fatty Acids (FISH OIL) 1200 MG CAPS Take by mouth every morning. Takes 2 in am    . prazosin (MINIPRESS) 5 MG capsule Take 5 mg by mouth at bedtime.     . predniSONE (DELTASONE) 10 MG tablet Take 4 tablets (40 mg total) by mouth daily. 20 tablet 0  . Probiotic Product (PROBIOTIC DAILY PO) Take by mouth every morning.    . risperiDONE (RISPERDAL) 2 MG tablet Take 2 mg by mouth at bedtime. Takes 1/2 tab at bedtime    . sertraline (ZOLOFT) 50 MG tablet Take 100 mg by mouth daily.     Marland Kitchen spironolactone (ALDACTONE) 25 MG tablet Take 25 mg  by mouth 2 (two) times daily.     . Thiamine HCl (VITAMIN B-1) 250 MG tablet Take 250 mg by mouth daily. Takes 2 in am    . Vitamin E 400 UNITS CHEW Chew by mouth every morning. Takes 2 in am     No current facility-administered medications for this visit.    OBJECTIVE: Filed Vitals:   11/07/14 1425  BP: 134/82  Pulse: 76  Temp: 98.4 F (36.9 C)  Resp: 18    Filed Weights   11/07/14 1425  Weight: 216 lb (97.977 kg)   ECOG FS:1 - Symptomatic but completely ambulatory Ocular: Sclerae unicteric, pupils equal, round and reactive to light Ear-nose-throat: Oropharynx clear, dentition fair Lymphatic: No cervical or supraclavicular adenopathy Lungs no rales or rhonchi, good excursion bilaterally Heart regular rate and rhythm, no murmur appreciated Abd soft, nontender, positive bowel sounds MSK no focal spinal tenderness, no joint edema Neuro: non-focal, well-oriented, appropriate affect Breasts: Deferred  LAB RESULTS: CMP     Component Value Date/Time   NA 140 11/02/2014 1600   K 3.8 11/02/2014 1600   CL 103 11/02/2014 1600   CO2 25 11/02/2014 1600   GLUCOSE 86 11/02/2014 1600   BUN 12 11/02/2014 1600   CREATININE 1.00 11/02/2014 1600   CALCIUM 9.7 11/02/2014 1600   PROT 8.0 11/02/2014 1600   ALBUMIN 3.8 11/02/2014 1600   AST 17 11/02/2014 1600   ALT 12 11/02/2014 1600   ALKPHOS 77 11/02/2014 1600   BILITOT 0.7 11/02/2014 1600   GFRNONAA 71* 11/02/2014 1600   GFRAA 82* 11/02/2014 1600   INo results found for: SPEP, UPEP Lab Results  Component Value Date   WBC 8.6 11/02/2014   NEUTROABS 6.1 11/02/2014   HGB 12.8 11/02/2014   HCT 39.4 11/02/2014   MCV 85.5 11/02/2014   PLT 227 11/02/2014   No results found for: LABCA2 No components found for: XBDZH299 No results for input(s): INR in the last 168 hours.  STUDIES: None  ASSESSMENT/PLAN: Ms. Eiland is 38 year old African female with history of a PE. She now is on aspirin daily. She is now having problems with  her left swelling and turning blue and also a rash across her cheeks that comes and goes. She has had no more nose bleeds or hemoptysis.  We will refer her to rheumatology to be evaluated for Lupus and also Raynaud's.  She also wanted a PCP so we set up an appointment for her with [name] on Dec 23rd.  Her chest xray was negative for PE.  Her CBC while in the ED was normal. We  checked a D-dimer today. We will see what that shows.  We will continue to follow-up with her as needed. She knows to call here with any questions or concerns and to go to the ED in the event of an emergency. We can certainly see her back at any time.   Eliezer Bottom, NP 11/07/2014 3:08 PM

## 2014-11-07 NOTE — Progress Notes (Signed)
Set up appointment with Dana Carpenter for 11/27/14 at 11:15 for patient to establish primary care. Patient called and notified.

## 2014-11-08 LAB — D-DIMER, QUANTITATIVE (NOT AT ARMC): D DIMER QUANT: 0.76 ug{FEU}/mL — AB (ref 0.00–0.48)

## 2014-11-27 ENCOUNTER — Encounter: Payer: Self-pay | Admitting: Family

## 2014-11-27 ENCOUNTER — Ambulatory Visit (INDEPENDENT_AMBULATORY_CARE_PROVIDER_SITE_OTHER): Payer: Managed Care, Other (non HMO) | Admitting: Family

## 2014-11-27 VITALS — BP 120/80 | HR 84 | Temp 98.0°F | Resp 16 | Ht 68.0 in | Wt 217.6 lb

## 2014-11-27 DIAGNOSIS — L709 Acne, unspecified: Secondary | ICD-10-CM

## 2014-11-27 DIAGNOSIS — R21 Rash and other nonspecific skin eruption: Secondary | ICD-10-CM | POA: Insufficient documentation

## 2014-11-27 DIAGNOSIS — L700 Acne vulgaris: Secondary | ICD-10-CM

## 2014-11-27 DIAGNOSIS — J0100 Acute maxillary sinusitis, unspecified: Secondary | ICD-10-CM | POA: Insufficient documentation

## 2014-11-27 DIAGNOSIS — K920 Hematemesis: Secondary | ICD-10-CM

## 2014-11-27 DIAGNOSIS — R1013 Epigastric pain: Secondary | ICD-10-CM | POA: Diagnosis not present

## 2014-11-27 DIAGNOSIS — Z634 Disappearance and death of family member: Secondary | ICD-10-CM

## 2014-11-27 DIAGNOSIS — J019 Acute sinusitis, unspecified: Secondary | ICD-10-CM | POA: Insufficient documentation

## 2014-11-27 DIAGNOSIS — Z86711 Personal history of pulmonary embolism: Secondary | ICD-10-CM

## 2014-11-27 DIAGNOSIS — F4321 Adjustment disorder with depressed mood: Secondary | ICD-10-CM

## 2014-11-27 DIAGNOSIS — R110 Nausea: Secondary | ICD-10-CM

## 2014-11-27 HISTORY — DX: Rash and other nonspecific skin eruption: R21

## 2014-11-27 HISTORY — DX: Acne, unspecified: L70.9

## 2014-11-27 LAB — HEPATIC FUNCTION PANEL
ALK PHOS: 81 U/L (ref 39–117)
ALT: 13 U/L (ref 0–35)
AST: 15 U/L (ref 0–37)
Albumin: 3.9 g/dL (ref 3.5–5.2)
BILIRUBIN DIRECT: 0.1 mg/dL (ref 0.0–0.3)
Total Bilirubin: 0.7 mg/dL (ref 0.2–1.2)
Total Protein: 7.3 g/dL (ref 6.0–8.3)

## 2014-11-27 LAB — CBC WITH DIFFERENTIAL/PLATELET
BASOS ABS: 0 10*3/uL (ref 0.0–0.1)
Basophils Relative: 0.8 % (ref 0.0–3.0)
Eosinophils Absolute: 0.3 10*3/uL (ref 0.0–0.7)
Eosinophils Relative: 5.8 % — ABNORMAL HIGH (ref 0.0–5.0)
HEMATOCRIT: 40.8 % (ref 36.0–46.0)
Hemoglobin: 12.9 g/dL (ref 12.0–15.0)
Lymphocytes Relative: 19.6 % (ref 12.0–46.0)
Lymphs Abs: 1.1 10*3/uL (ref 0.7–4.0)
MCHC: 31.5 g/dL (ref 30.0–36.0)
MCV: 86.1 fl (ref 78.0–100.0)
MONO ABS: 0.4 10*3/uL (ref 0.1–1.0)
Monocytes Relative: 6.5 % (ref 3.0–12.0)
Neutro Abs: 3.9 10*3/uL (ref 1.4–7.7)
Neutrophils Relative %: 67.3 % (ref 43.0–77.0)
PLATELETS: 211 10*3/uL (ref 150.0–400.0)
RBC: 4.74 Mil/uL (ref 3.87–5.11)
RDW: 14.2 % (ref 11.5–15.5)
WBC: 5.9 10*3/uL (ref 4.0–10.5)

## 2014-11-27 LAB — H. PYLORI ANTIBODY, IGG: H PYLORI IGG: NEGATIVE

## 2014-11-27 MED ORDER — OMEPRAZOLE 40 MG PO CPDR: 40.0000 mg | DELAYED_RELEASE_CAPSULE | Freq: Every day | ORAL | Status: DC

## 2014-11-27 MED ORDER — LEVOFLOXACIN 500 MG PO TABS: 500.0000 mg | ORAL_TABLET | Freq: Every day | ORAL | Status: DC

## 2014-11-27 NOTE — Assessment & Plan Note (Signed)
Now off of anticoagulation per Hematology

## 2014-11-27 NOTE — Progress Notes (Signed)
Subjective:    Patient ID: Dana Carpenter, female    DOB: [birth year], 37 y.o.   MRN: [id number]  HPI  [name] is a 38 yr old female who presents today to establish care. She is referred to [place] by Laverna Peace NP who follows her due to hx of PE 4/14 and IgM anticardiolipin antibody positive- low titer.    Reports for last 6 weeks she has had nausea/vomitting/heart burn. Sensitive to smells. Reports that she was given amoxicillin 1 week ago for sinus infection but that this is not helping. Feels hot but no fever. Woke up vomiting this AM. Reports that emesis was "red and tasted like blood."  Report that emesis looked like coffee ground emesis. Denies recent use of NSAIDS  Depression- reports that stopped her zoloft, clonazepam and wellbutrin a few weeks ago. Also took risperdal HS- see psychiatrist at the [facility]. Reports 29 year old son died in a car accident last year. Trying to deal with it.   Has apt with rheumatologist in January for work up of possible lupus.   She reports that she has had some recent left hand swelling and wrist pain, this is intermittent in nature. She also reports chronic rash on cheeks.   Acne- reports that she takes aldactone for her acne which helps. This is managed by dermatology.    Review of Systems  Constitutional:       Wt Readings from Last 3 Encounters: 11/27/14 : 217 lb 9.6 oz (98.703 kg) 11/07/14 : 216 lb (97.977 kg) 11/02/14 : 210 lb (95.255 kg)  Reports 20 pound weight gain in last 6 months. Attributes to poor diet and lack of exercise  Respiratory: Positive for cough.        + cough, started with a cold.    Gastrointestinal:       Reports that she vomits 2-3 days a week. This Am was the worst, and was the only time that she saw blood.  Reports + constipation, denies black/bloody stools.   Musculoskeletal:       Reports history of low back pain.Does have a dull aching pain in the lower back. Was told that she has ruptured disc in  the lumbar spine and did PT- symptoms resolved.   Skin:       + facial rash on cheeks. "hurts" comes and goes  Neurological: Negative for numbness.       Reports constant headaches, ? Secondary to sinus infection.   Hematological: Negative for adenopathy.   Past Medical History  Diagnosis Date  . Strep pharyngitis   . PE (pulmonary embolism) 03/26/2013    RT LOWER LOBE  . Depression   . Anxiety     History   Social History  . Marital Status: Single    Spouse Name: N/A    Number of Children: N/A  . Years of Education: N/A   Occupational History  . Not on file.   Social History Main Topics  . Smoking status: Never Smoker   . Smokeless tobacco: Never Used     Comment: never used tobacco  . Alcohol Use: Yes     Comment: occasional  . Drug Use: No  . Sexual Activity:    Partners: Male    Patent examiner Protection: None   Other Topics Concern  . Not on file   Social History Narrative   Lives with 2 sons [name] 2008, [name] 2011   Older son born 28- in and out of the  home   Boyfriend, but single   Works in [employer]   Enjoys sleeping.   Completed some college   Sister lives locally, most of her family in [place] and has 2 aunts in [place]    Past Surgical History  Procedure Laterality Date  . No past surgeries    . Wisdom tooth extraction Bilateral     Family History  Problem Relation Age of Onset  . Breast cancer Paternal Grandmother   . Diabetes Paternal Grandfather   . Cancer Paternal Grandfather     prostate  . Hypertension Father   . Heart disease Maternal Grandfather   . Healthy Sister     x 1  . Diabetes Sister 46    type II  . Hypertension Sister   . Heart attack Mother 4    deceased- also had lung disorder (? pulmonary fibrosis)    No Known Allergies  Current Outpatient Prescriptions on File Prior to Visit  Medication Sig Dispense Refill  . clonazePAM (KLONOPIN) 1 MG tablet Take by mouth 2 (two) times daily.  Pt states she only takes this as needed.    Marland Kitchen spironolactone (ALDACTONE) 25 MG tablet Take 25 mg by mouth 2 (two) times daily.      No current facility-administered medications on file prior to visit.    BP 120/80 mmHg  Pulse 84  Temp(Src) 98 F (36.7 C) (Oral)  Resp 16  Ht 5\' 8"  (1.727 m)  Wt 217 lb 9.6 oz (98.703 kg)  BMI 33.09 kg/m2  SpO2 99%  LMP 11/10/2014       Objective:   Physical Exam  Constitutional: She is oriented to person, place, and time. She appears well-developed and well-nourished. No distress.  HENT:  Head: Normocephalic and atraumatic.  Bilateral maxillary sinus tenderness to palpation  Eyes: No scleral icterus.  Neck: No thyromegaly present.  Cardiovascular: Normal rate and regular rhythm.   No murmur heard. Pulmonary/Chest: Effort normal and breath sounds normal. No respiratory distress. She has no wheezes. She has no rales. She exhibits no tenderness.  Abdominal: Soft. She exhibits no distension. There is tenderness in the epigastric area. There is no rigidity and no guarding.  Musculoskeletal: She exhibits no edema.  Lymphadenopathy:    She has no cervical adenopathy.  Neurological: She is alert and oriented to person, place, and time.  Skin: Skin is warm and dry.  Erythema bilateral cheeks  Psychiatric: She has a normal mood and affect. Her behavior is normal. Judgment and thought content normal.          Assessment & Plan:

## 2014-11-27 NOTE — Progress Notes (Signed)
Pre visit review using our clinic review tool, if applicable. No additional management support is needed unless otherwise documented below in the visit note. 

## 2014-11-27 NOTE — Assessment & Plan Note (Signed)
On aldactone which is managed by her dermatologist.

## 2014-11-27 NOTE — Patient Instructions (Signed)
Start levaquin and flonase (OTC 2 sprays each nostril once daily) for sinus infection. Start omeprazole once daily for stomach- avoid aspirin/ibuprofen, aleve etc. Complete lab work prior to leaving. You will contact you with your GI appointment. Go to the ED if you develop recurrent bloody/coffee grounds in vomit, severe abdominal pain, black/bloody stools.   Follow up in 2 months for complete physical. Welcome to [place]!

## 2014-11-27 NOTE — Assessment & Plan Note (Signed)
Advised pt to keep upcoming appointment with rheumatology for further evaluation.

## 2014-11-27 NOTE — Assessment & Plan Note (Signed)
Rx with levaquin.

## 2014-11-27 NOTE — Assessment & Plan Note (Signed)
One episode of hematemesis.  Suspect PUD. Start PPI, obtain, cbc, H.pylori and refer to GI. Advised pt: avoid aspirin/ibuprofen, aleve etc. Go to the ED if you develop recurrent bloody/coffee grounds in vomit, severe abdominal pain, black/bloody stools.

## 2014-11-27 NOTE — Assessment & Plan Note (Signed)
Did not note improvement with meds which were being prescribed by psychiatry. Support provided. Monitor.

## 2014-11-30 ENCOUNTER — Encounter: Payer: Self-pay | Admitting: Family

## 2014-12-02 ENCOUNTER — Telehealth: Payer: Self-pay | Admitting: Family

## 2014-12-02 ENCOUNTER — Encounter: Payer: Self-pay | Admitting: *Deleted

## 2014-12-02 NOTE — Telephone Encounter (Signed)
Labs reviewed with patient.  She stated understanding.  No further questions or concerns voiced.

## 2014-12-02 NOTE — Telephone Encounter (Signed)
Caller name: [name] Relation to pt: self Call back number: [phone] Pharmacy:  Reason for call:   Please call patient. She has questions regarding labs that she received through mychart.

## 2014-12-03 ENCOUNTER — Encounter: Payer: Self-pay | Admitting: Gastroenterology

## 2014-12-09 ENCOUNTER — Ambulatory Visit: Payer: Managed Care, Other (non HMO) | Admitting: Physician Assistant

## 2014-12-29 ENCOUNTER — Encounter (HOSPITAL_BASED_OUTPATIENT_CLINIC_OR_DEPARTMENT_OTHER): Payer: Self-pay

## 2014-12-29 ENCOUNTER — Emergency Department (HOSPITAL_BASED_OUTPATIENT_CLINIC_OR_DEPARTMENT_OTHER): Payer: Managed Care, Other (non HMO)

## 2014-12-29 ENCOUNTER — Emergency Department (HOSPITAL_BASED_OUTPATIENT_CLINIC_OR_DEPARTMENT_OTHER)
Admission: EM | Admit: 2014-12-29 | Discharge: 2014-12-29 | Disposition: A | Discharge status: Home / Self Care | Payer: Managed Care, Other (non HMO) | Attending: Emergency Medicine | Admitting: Emergency Medicine

## 2014-12-29 DIAGNOSIS — Z79899 Other long term (current) drug therapy: Secondary | ICD-10-CM | POA: Insufficient documentation

## 2014-12-29 DIAGNOSIS — Z86711 Personal history of pulmonary embolism: Secondary | ICD-10-CM | POA: Insufficient documentation

## 2014-12-29 DIAGNOSIS — J111 Influenza due to unidentified influenza virus with other respiratory manifestations: Secondary | ICD-10-CM | POA: Insufficient documentation

## 2014-12-29 DIAGNOSIS — F329 Major depressive disorder, single episode, unspecified: Secondary | ICD-10-CM | POA: Diagnosis not present

## 2014-12-29 DIAGNOSIS — J069 Acute upper respiratory infection, unspecified: Secondary | ICD-10-CM | POA: Insufficient documentation

## 2014-12-29 DIAGNOSIS — R000 Tachycardia, unspecified: Secondary | ICD-10-CM | POA: Insufficient documentation

## 2014-12-29 DIAGNOSIS — R05 Cough: Secondary | ICD-10-CM | POA: Diagnosis not present

## 2014-12-29 DIAGNOSIS — F419 Anxiety disorder, unspecified: Secondary | ICD-10-CM | POA: Diagnosis not present

## 2014-12-29 DIAGNOSIS — Z3202 Encounter for pregnancy test, result negative: Secondary | ICD-10-CM | POA: Insufficient documentation

## 2014-12-29 DIAGNOSIS — Z792 Long term (current) use of antibiotics: Secondary | ICD-10-CM | POA: Insufficient documentation

## 2014-12-29 DIAGNOSIS — R059 Cough, unspecified: Secondary | ICD-10-CM

## 2014-12-29 DIAGNOSIS — R69 Illness, unspecified: Secondary | ICD-10-CM

## 2014-12-29 LAB — URINALYSIS, ROUTINE W REFLEX MICROSCOPIC
BILIRUBIN URINE: NEGATIVE
Glucose, UA: NEGATIVE mg/dL
HGB URINE DIPSTICK: NEGATIVE
Ketones, ur: NEGATIVE mg/dL
Leukocytes, UA: NEGATIVE
NITRITE: NEGATIVE
Protein, ur: NEGATIVE mg/dL
Specific Gravity, Urine: 1.011 (ref 1.005–1.030)
Urobilinogen, UA: 0.2 mg/dL (ref 0.0–1.0)
pH: 6 (ref 5.0–8.0)

## 2014-12-29 LAB — BASIC METABOLIC PANEL
Anion gap: 3 — ABNORMAL LOW (ref 5–15)
BUN: 12 mg/dL (ref 6–23)
CO2: 28 mmol/L (ref 19–32)
CREATININE: 0.75 mg/dL (ref 0.50–1.10)
Calcium: 8.8 mg/dL (ref 8.4–10.5)
Chloride: 105 mmol/L (ref 96–112)
GFR calc Af Amer: >90 mL/min (ref >90)
Glucose, Bld: 107 mg/dL — ABNORMAL HIGH (ref 70–99)
Potassium: 4.1 mmol/L (ref 3.5–5.1)
SODIUM: 136 mmol/L (ref 135–145)

## 2014-12-29 LAB — CBC WITH DIFFERENTIAL/PLATELET
BASOS ABS: 0 10*3/uL (ref 0.0–0.1)
Basophils Relative: 0 % (ref 0–1)
EOS ABS: 0.3 10*3/uL (ref 0.0–0.7)
Eosinophils Relative: 3 % (ref 0–5)
HCT: 42.1 % (ref 36.0–46.0)
HEMOGLOBIN: 13.4 g/dL (ref 12.0–15.0)
Lymphocytes Relative: 16 % (ref 12–46)
Lymphs Abs: 1.8 10*3/uL (ref 0.7–4.0)
MCH: 27.5 pg (ref 26.0–34.0)
MCHC: 31.8 g/dL (ref 30.0–36.0)
MCV: 86.3 fL (ref 78.0–100.0)
Monocytes Absolute: 0.8 10*3/uL (ref 0.1–1.0)
Monocytes Relative: 7 % (ref 3–12)
Neutro Abs: 8.3 10*3/uL — ABNORMAL HIGH (ref 1.7–7.7)
Neutrophils Relative %: 74 % (ref 43–77)
Platelets: 223 10*3/uL (ref 150–400)
RBC: 4.88 MIL/uL (ref 3.87–5.11)
RDW: 14.3 % (ref 11.5–15.5)
WBC: 11.3 10*3/uL — ABNORMAL HIGH (ref 4.0–10.5)

## 2014-12-29 LAB — PREGNANCY, URINE: Preg Test, Ur: NEGATIVE

## 2014-12-29 LAB — I-STAT CG4 LACTIC ACID, ED: LACTIC ACID, VENOUS: 1.06 mmol/L (ref 0.5–2.0)

## 2014-12-29 MED ORDER — HYDROCODONE-ACETAMINOPHEN 5-325 MG PO TABS: ORAL_TABLET | ORAL | Status: DC

## 2014-12-29 MED ORDER — SODIUM CHLORIDE 0.9 % IV BOLUS (SEPSIS)
1000.0000 mL | Freq: Once | INTRAVENOUS | Status: AC
  Administered 2014-12-29: 1000 mL via INTRAVENOUS

## 2014-12-29 MED ORDER — KETOROLAC TROMETHAMINE 15 MG/ML IJ SOLN
15.0000 mg | Freq: Once | INTRAMUSCULAR | Status: AC
  Administered 2014-12-29: 15 mg via INTRAVENOUS
  Filled 2014-12-29: qty 1

## 2014-12-29 NOTE — ED Provider Notes (Signed)
CSN: [id number]     Arrival date & time 12/29/14  1634 History   First MD Initiated Contact with Patient 12/29/14 1657     Chief Complaint  Patient presents with  . URI     (Consider location/radiation/quality/duration/timing/severity/associated sxs/prior Treatment) HPI   Dana Carpenter is a 39 y.o. female complaining of chills, productive cough, pleuritic chest pain, myalgia, 5 episodes of nonbloody, nonbilious, non-coffee ground emesis, rhinorrhea, pharyngitis onset yesterday (Note that this contradicts triage note). Patient has had her flu shot this year. Denies sick contacts. She denies abdominal pain, diarrhea, cervicalgia, rash, recent travel. She's been taking Motrin and TheraFlu with little relief.  Past Medical History  Diagnosis Date  . Strep pharyngitis   . PE (pulmonary embolism) 03/26/2013    RT LOWER LOBE  . Depression   . Anxiety    Past Surgical History  Procedure Laterality Date  . No past surgeries    . Wisdom tooth extraction Bilateral    Family History  Problem Relation Age of Onset  . Breast cancer Paternal Grandmother   . Diabetes Paternal Grandfather   . Prostate cancer Paternal Grandfather   . Hypertension Father   . Heart disease Maternal Grandfather   . Healthy Sister     x 1  . Diabetes Sister 89    type II  . Hypertension Sister   . Heart attack Mother 63    deceased- also had lung disorder (? pulmonary fibrosis)   History  Substance Use Topics  . Smoking status: Never Smoker   . Smokeless tobacco: Never Used     Comment: never used tobacco  . Alcohol Use: No     Comment: occasional   OB History    Gravida Para Term Preterm AB TAB SAB Ectopic Multiple Living   4 4 4       4      Review of Systems  10 systems reviewed and found to be negative, except as noted in the HPI.   Allergies  Review of patient's allergies indicates no known allergies.  Home Medications   Prior to Admission medications   Medication Sig Start Date End  Date Taking? Authorizing Provider  clonazePAM (KLONOPIN) 1 MG tablet Take by mouth 2 (two) times daily. Pt states she only takes this as needed. 11/20/13   Historical Provider, MD  levofloxacin (LEVAQUIN) 500 MG tablet Take 1 tablet (500 mg total) by mouth daily. 11/27/14   [name], NP  Multiple Vitamins-Minerals (MULTIVITAMIN WITH MINERALS) tablet Take 1 tablet by mouth daily.    Historical Provider, MD  omeprazole (PRILOSEC) 40 MG capsule Take 1 capsule (40 mg total) by mouth daily. 11/27/14   [name], NP  spironolactone (ALDACTONE) 25 MG tablet Take 25 mg by mouth 2 (two) times daily.     Historical Provider, MD   BP 118/79 mmHg  Pulse 109  Temp(Src) 98.4 F (36.9 C) (Oral)  Resp 18  Ht 5\' 6"  (1.676 m)  Wt 215 lb (97.523 kg)  BMI 34.72 kg/m2  SpO2 100% Physical Exam  Constitutional: She is oriented to person, place, and time. She appears well-developed and well-nourished. No distress.  HENT:  Head: Normocephalic and atraumatic.  Mouth/Throat: Oropharynx is clear and moist.  No drooling or stridor. Posterior pharynx mildly erythematous no significant tonsillar hypertrophy. No exudate. Soft palate rises symmetrically. No TTP or induration under tongue.   No tenderness to palpation of frontal or bilateral maxillary sinuses.  No mucosal edema in the nares.  Bilateral  tympanic membranes with normal architecture and good light reflex.    Eyes: Conjunctivae and EOM are normal. Pupils are equal, round, and reactive to light.  Neck: Normal range of motion. Neck supple.  FROM to C-spine. Pt can touch chin to chest without discomfort. No TTP of midline cervical spine.   Cardiovascular:  Mild tachycardia  Pulmonary/Chest: Effort normal and breath sounds normal. No stridor. No respiratory distress. She has no wheezes. She has no rales. She exhibits no tenderness.  Abdominal: Soft. Bowel sounds are normal. She exhibits no distension and no mass. There is no tenderness.  There is no rebound and no guarding.  Musculoskeletal: Normal range of motion.  Neurological: She is alert and oriented to person, place, and time.  Psychiatric: She has a normal mood and affect.  Nursing note and vitals reviewed.   ED Course  Procedures (including critical care time) Labs Review Labs Reviewed  CBC WITH DIFFERENTIAL/PLATELET - Abnormal; Notable for the following:    WBC 11.3 (*)    Neutro Abs 8.3 (*)    All other components within normal limits  BASIC METABOLIC PANEL - Abnormal; Notable for the following:    Glucose, Bld 107 (*)    Anion gap 3 (*)    All other components within normal limits  URINALYSIS, ROUTINE W REFLEX MICROSCOPIC  PREGNANCY, URINE  I-STAT CG4 LACTIC ACID, ED    Imaging Review No results found.   EKG Interpretation None      MDM   Final diagnoses:  Cough  Influenza-like illness    Filed Vitals:   12/29/14 1645  BP: 118/79  Pulse: 109  Temp: 98.4 F (36.9 C)  TempSrc: Oral  Resp: 18  Height: 5\' 6"  (1.676 m)  Weight: 215 lb (97.523 kg)  SpO2: 100%    Medications  sodium chloride 0.9 % bolus 1,000 mL (0 mLs Intravenous Stopped 12/29/14 1848)  ketorolac (TORADOL) 15 MG/ML injection 15 mg (15 mg Intravenous Given 12/29/14 1809)    Dana Carpenter is a pleasant 39 y.o. female presenting with cough, myalgia, tactile fever and chills, rhinorrhea, sore throat worsening over the course of 2 days. Lung sounds are clear to auscultation bilaterally, she is saturating well on room air. She has mild tachycardia at 109. Blood work shows normal lactic acid. Mild leukocytosis. Chest x-ray is without infiltrate. Likely influenza. Patient is young and has no comorbidities, no indications for Tamiflu at this time. We'll treat symptomatically. Extensive discussion of return precautions.   Evaluation does not show pathology that would require ongoing emergent intervention or inpatient treatment. Pt is hemodynamically stable and mentating  appropriately. Discussed findings and plan with patient/guardian, who agrees with care plan. All questions answered. Return precautions discussed and outpatient follow up given.   Discharge Medication List as of 12/29/2014  6:42 PM    START taking these medications   Details  HYDROcodone-acetaminophen (NORCO/VICODIN) 5-325 MG per tablet Take 1-2 tablets by mouth every 6 hours as needed for pain and/or cough., Print             Monico Blitz, PA-C 12/29/14 [place], MD 12/29/14 [phone]

## 2014-12-29 NOTE — ED Notes (Signed)
Pt reports cold symptoms, body pain, subjective fever and vomiting x 3 days.

## 2014-12-29 NOTE — Discharge Instructions (Signed)

## 2015-01-01 ENCOUNTER — Ambulatory Visit (INDEPENDENT_AMBULATORY_CARE_PROVIDER_SITE_OTHER): Payer: Managed Care, Other (non HMO) | Admitting: Family Medicine

## 2015-01-01 ENCOUNTER — Encounter: Payer: Self-pay | Admitting: Family Medicine

## 2015-01-01 VITALS — BP 116/81 | HR 83 | Temp 98.0°F | Resp 18 | Ht 68.0 in | Wt 217.0 lb

## 2015-01-01 DIAGNOSIS — J018 Other acute sinusitis: Secondary | ICD-10-CM

## 2015-01-01 MED ORDER — LEVOFLOXACIN 500 MG PO TABS: 500.0000 mg | ORAL_TABLET | Freq: Every day | ORAL | Status: DC

## 2015-01-01 NOTE — Progress Notes (Signed)
Pre visit review using our clinic review tool, if applicable. No additional management support is needed unless otherwise documented below in the visit note. 

## 2015-01-01 NOTE — Progress Notes (Signed)
OFFICE NOTE  01/01/2015  CC:  Chief Complaint  Patient presents with  . URI    x one week  . Cough   HPI: Patient is a 39 y.o. Caucasian female who is here for respiratory complaints. Onset 1 week ago nasal congestion, cough, chest hurts when she coughs.  Cough keeping her up at night. No fever.  Facial pain and upper teeth pain.  Peri-orbital pressure, right ear pain coming and going. Wheezing ? "a little".  Theraflu.  Saw MD at [facility] 3-4d ago and was rx'd vicodin but she has not filled this. Flonase qd.     Pertinent PMH:  PMH and PSH reviewed.  MEDS:  Pt not taking levaquin listed below Outpatient Prescriptions Prior to Visit  Medication Sig Dispense Refill  . clonazePAM (KLONOPIN) 1 MG tablet Take by mouth 2 (two) times daily. Pt states she only takes this as needed.    . Multiple Vitamins-Minerals (MULTIVITAMIN WITH MINERALS) tablet Take 1 tablet by mouth daily.    Marland Kitchen omeprazole (PRILOSEC) 40 MG capsule Take 1 capsule (40 mg total) by mouth daily. 30 capsule 3  . spironolactone (ALDACTONE) 25 MG tablet Take 25 mg by mouth 2 (two) times daily.     Marland Kitchen HYDROcodone-acetaminophen (NORCO/VICODIN) 5-325 MG per tablet Take 1-2 tablets by mouth every 6 hours as needed for pain and/or cough. (Patient not taking: Reported on 01/01/2015) 9 tablet 0  . levofloxacin (LEVAQUIN) 500 MG tablet Take 1 tablet (500 mg total) by mouth daily. 7 tablet 0   No facility-administered medications prior to visit.    PE: Blood pressure 116/81, pulse 83, temperature 98 F (36.7 C), temperature source Temporal, resp. rate 18, height 5\' 8"  (1.727 m), weight 217 lb (98.431 kg), last menstrual period 12/11/2014, SpO2 100 %. VS: noted--normal. Gen: alert, NAD, NONTOXIC APPEARING. HEENT: eyes without injection, drainage, or swelling.  Ears: EACs clear, TMs with normal light reflex and landmarks.  Nose: Clear rhinorrhea, with some dried, crusty exudate adherent to mildly injected mucosa.  No purulent d/c.  Bilat  paranasal sinus TTP.  No facial swelling.  Throat and mouth without focal lesion.  No pharyngial swelling, erythema, or exudate.   Neck: supple, no LAD.   LUNGS: CTA bilat, nonlabored resps.   CV: RRR, no m/r/g. EXT: no c/c/e SKIN: no rash  LABS; none  IMPRESSION AND PLAN:  Acute sinusitis with viral bronchitis but no sign of RAD on exam today. Recommended mucinex DM, add saline nasal spray, continue flonase qd. Pt says levaquin rx'd for last sinusitis flare was very effective so i rx'd this again today--7 day course.  An After Visit Summary was printed and given to the patient.  FOLLOW UP: prn

## 2015-01-28 ENCOUNTER — Ambulatory Visit: Payer: Managed Care, Other (non HMO) | Admitting: Family

## 2015-01-28 NOTE — Progress Notes (Signed)
   Subjective:   Subjective:

## 2015-01-29 ENCOUNTER — Telehealth: Payer: Self-pay | Admitting: Family

## 2015-01-29 NOTE — Telephone Encounter (Signed)
Yes

## 2015-01-29 NOTE — Telephone Encounter (Signed)
Forwarded to [place].  

## 2015-01-29 NOTE — Telephone Encounter (Signed)
PT was no show for appt on 02/23- she has rescheduled for 3/8. Charge no show fee?

## 2015-02-11 ENCOUNTER — Encounter: Payer: Self-pay | Admitting: Family

## 2015-02-11 ENCOUNTER — Ambulatory Visit (INDEPENDENT_AMBULATORY_CARE_PROVIDER_SITE_OTHER): Payer: Managed Care, Other (non HMO) | Admitting: Family

## 2015-02-11 VITALS — BP 108/70 | HR 82 | Temp 98.2°F | Resp 18 | Ht 68.0 in | Wt 218.4 lb

## 2015-02-11 DIAGNOSIS — Z0000 Encounter for general adult medical examination without abnormal findings: Secondary | ICD-10-CM

## 2015-02-11 DIAGNOSIS — R739 Hyperglycemia, unspecified: Secondary | ICD-10-CM

## 2015-02-11 LAB — HEMOGLOBIN A1C: HEMOGLOBIN A1C: 5.4 % (ref 4.6–6.5)

## 2015-02-11 LAB — LIPID PANEL
CHOLESTEROL: 144 mg/dL (ref 0–200)
HDL: 45.1 mg/dL (ref >39.00)
LDL Cholesterol: 88 mg/dL (ref 0–99)
NonHDL: 98.9
TRIGLYCERIDES: 56 mg/dL (ref 0.0–149.0)
Total CHOL/HDL Ratio: 3
VLDL: 11.2 mg/dL (ref 0.0–40.0)

## 2015-02-11 LAB — TSH: TSH: 1.04 u[IU]/mL (ref 0.35–4.50)

## 2015-02-11 MED ORDER — OMEPRAZOLE 40 MG PO CPDR: 40.0000 mg | DELAYED_RELEASE_CAPSULE | Freq: Every day | ORAL | Status: DC

## 2015-02-11 NOTE — Progress Notes (Signed)
Subjective:    Patient ID: Dana Carpenter, female    DOB: Mar 13, 1976, 39 y.o.   MRN: [id number]  HPI  Patient presents today for complete physical.  Immunizations: up to date Diet: could be improved Exercise:yes, joined the gym, going every day. Pap 2014- normal per pt.  Episode of hematemesis.  Started PPI- never went to GI.  Symptoms resolved feeling better.    Anxiety/depression- unchanged- lost 62 yr old son. Felt that the therapist worsened things. Does not wish to take an antidepressant.    Review of Systems  Constitutional: Negative for unexpected weight change.       30 pounds weight gain due to depression in last year.   HENT: Negative for hearing loss and rhinorrhea.   Eyes: Negative for visual disturbance.  Respiratory: Negative for cough and shortness of breath.   Cardiovascular: Negative for leg swelling.       Palpitations with panic attacks twice weekly  Gastrointestinal: Negative for nausea, diarrhea and constipation.  Genitourinary: Negative for dysuria and frequency.  Musculoskeletal: Negative for myalgias and arthralgias.  Skin: Negative for rash.  Neurological: Negative for headaches.  Hematological: Negative for adenopathy.  Psychiatric/Behavioral: Negative for dysphoric mood and agitation.   See HPI  Past Medical History  Diagnosis Date  . Strep pharyngitis   . PE (pulmonary embolism) 03/26/2013    RT LOWER LOBE  . Depression   . Anxiety     History   Social History  . Marital Status: Single    Spouse Name: N/A  . Number of Children: N/A  . Years of Education: N/A   Occupational History  . Not on file.   Social History Main Topics  . Smoking status: Never Smoker   . Smokeless tobacco: Never Used     Comment: never used tobacco  . Alcohol Use: No     Comment: occasional  . Drug Use: No  . Sexual Activity:    Partners: Male    Patent examiner Protection: None   Other Topics Concern  . Not on file   Social History Narrative   Lives with 2 sons [name] 2008, [name] 2011   Older son born 14- in and out of the home   Boyfriend, but single   Works in [employer]   Enjoys sleeping.   Completed some college   Sister lives locally, most of her family in [place] and has 2 aunts in [place]    Past Surgical History  Procedure Laterality Date  . No past surgeries    . Wisdom tooth extraction Bilateral     Family History  Problem Relation Age of Onset  . Breast cancer Paternal Grandmother   . Diabetes Paternal Grandfather   . Prostate cancer Paternal Grandfather   . Hypertension Father   . Heart disease Maternal Grandfather   . Healthy Sister     x 1  . Diabetes Sister 71    type II  . Hypertension Sister   . Heart attack Mother 63    deceased- also had lung disorder (? pulmonary fibrosis)    No Known Allergies  Current Outpatient Prescriptions on File Prior to Visit  Medication Sig Dispense Refill  . clonazePAM (KLONOPIN) 1 MG tablet Take by mouth 2 (two) times daily. Pt states she only takes this as needed.    Marland Kitchen HYDROcodone-acetaminophen (NORCO/VICODIN) 5-325 MG per tablet Take 1-2 tablets by mouth every 6 hours as needed for pain and/or cough. (Patient not taking: Reported on 01/01/2015)  9 tablet 0  . levofloxacin (LEVAQUIN) 500 MG tablet Take 1 tablet (500 mg total) by mouth daily. 7 tablet 0  . Multiple Vitamins-Minerals (MULTIVITAMIN WITH MINERALS) tablet Take 1 tablet by mouth daily.    Marland Kitchen omeprazole (PRILOSEC) 40 MG capsule Take 1 capsule (40 mg total) by mouth daily. 30 capsule 3  . spironolactone (ALDACTONE) 25 MG tablet Take 25 mg by mouth 2 (two) times daily.      No current facility-administered medications on file prior to visit.    BP 108/70 mmHg  Pulse 82  Temp(Src) 98.2 F (36.8 C) (Oral)  Resp 18  Ht 5\' 8"  (1.727 m)  Wt 218 lb 6.4 oz (99.066 kg)  BMI 33.22 kg/m2  SpO2 98%  LMP 02/04/2015       Objective:   Physical Exam Physical Exam    Constitutional: She is oriented to person, place, and time. She appears well-developed and well-nourished. No distress.  HENT:  Head: Normocephalic and atraumatic.  Right Ear: Tympanic membrane and ear canal normal.  Left Ear: Tympanic membrane and ear canal normal.  Mouth/Throat: Oropharynx is clear and moist.  Eyes: Pupils are equal, round, and reactive to light. No scleral icterus.  Neck: Normal range of motion. No thyromegaly present.  Cardiovascular: Normal rate and regular rhythm.   No murmur heard. Pulmonary/Chest: Effort normal and breath sounds normal. No respiratory distress. He has no wheezes. She has no rales. She exhibits no tenderness.  Abdominal: Soft. Bowel sounds are normal. He exhibits no distension and no mass. There is no tenderness. There is no rebound and no guarding.  Musculoskeletal: She exhibits no edema.  Lymphadenopathy:    She has no cervical adenopathy.  Neurological: She is alert and oriented to person, place, and time. She has normal reflexes. She exhibits normal muscle tone. Coordination normal.  Skin: Skin is warm and dry.  Psychiatric: She has a normal mood and affect. Her behavior is normal. Judgment and thought content normal.        Assessment & Plan:    Advised pt to schedule follow up with GI.       Assessment & Plan:

## 2015-02-11 NOTE — Progress Notes (Signed)
Pre visit review using our clinic review tool, if applicable. No additional management support is needed unless otherwise documented below in the visit note. 

## 2015-02-11 NOTE — Patient Instructions (Signed)
Please complete lab work prior to leaving. Please schedule a follow up appointment in 6 months.

## 2015-02-13 ENCOUNTER — Encounter: Payer: Self-pay | Admitting: Family

## 2015-02-13 DIAGNOSIS — B3731 Acute candidiasis of vulva and vagina: Secondary | ICD-10-CM

## 2015-02-13 DIAGNOSIS — Z0001 Encounter for general adult medical examination with abnormal findings: Secondary | ICD-10-CM

## 2015-02-13 DIAGNOSIS — Z0000 Encounter for general adult medical examination without abnormal findings: Secondary | ICD-10-CM | POA: Insufficient documentation

## 2015-02-13 DIAGNOSIS — B373 Candidiasis of vulva and vagina: Secondary | ICD-10-CM

## 2015-02-13 HISTORY — DX: Encounter for general adult medical examination with abnormal findings: Z00.01

## 2015-02-13 NOTE — Assessment & Plan Note (Addendum)
Discussed healthy diet, exercise, immunizations/pap up to date. Obtain routine lab work.

## 2015-02-17 ENCOUNTER — Encounter: Payer: Self-pay | Admitting: Adult Health

## 2015-02-17 ENCOUNTER — Ambulatory Visit (INDEPENDENT_AMBULATORY_CARE_PROVIDER_SITE_OTHER): Payer: Managed Care, Other (non HMO) | Admitting: Adult Health

## 2015-02-17 ENCOUNTER — Other Ambulatory Visit (HOSPITAL_COMMUNITY)
Admission: RE | Admit: 2015-02-17 | Discharge: 2015-02-17 | Disposition: A | Discharge status: Home / Self Care | Payer: Managed Care, Other (non HMO) | Source: Ambulatory Visit | Attending: Adult Health | Admitting: Adult Health

## 2015-02-17 VITALS — BP 126/84 | HR 84 | Temp 98.3°F | Wt 217.0 lb

## 2015-02-17 DIAGNOSIS — N760 Acute vaginitis: Secondary | ICD-10-CM | POA: Insufficient documentation

## 2015-02-17 NOTE — Progress Notes (Signed)
Pre visit review using our clinic review tool, if applicable. No additional management support is needed unless otherwise documented below in the visit note. 

## 2015-02-17 NOTE — Patient Instructions (Addendum)
We will send the wet prep today and will have the results tomorrow afternoon. Once I get the results I will let you know what we can prescribe to help you feel better. You can take tylenol or motrin for the pain. You may also try benadryl in the evening for itching.   Vaginitis Vaginitis is an inflammation of the vagina. It is most often caused by a change in the normal balance of the bacteria and yeast that live in the vagina. This change in balance causes an overgrowth of certain bacteria or yeast, which causes the inflammation. There are different types of vaginitis, but the most common types are:  Bacterial vaginosis.  Yeast infection (candidiasis).  Trichomoniasis vaginitis. This is a sexually transmitted infection (STI).  Viral vaginitis.  Atropic vaginitis.  Allergic vaginitis. CAUSES  The cause depends on the type of vaginitis. Vaginitis can be caused by:  Bacteria (bacterial vaginosis).  Yeast (yeast infection).  A parasite (trichomoniasis vaginitis)  A virus (viral vaginitis).  Low hormone levels (atrophic vaginitis). Low hormone levels can occur during pregnancy, breastfeeding, or after menopause.  Irritants, such as bubble baths, scented tampons, and feminine sprays (allergic vaginitis). Other factors can change the normal balance of the yeast and bacteria that live in the vagina. These include:  Antibiotic medicines.  Poor hygiene.  Diaphragms, vaginal sponges, spermicides, birth control pills, and intrauterine devices (IUD).  Sexual intercourse.  Infection.  Uncontrolled diabetes.  A weakened immune system. SYMPTOMS  Symptoms can vary depending on the cause of the vaginitis. Common symptoms include:  Abnormal vaginal discharge.  The discharge is white, gray, or yellow with bacterial vaginosis.  The discharge is thick, white, and cheesy with a yeast infection.  The discharge is frothy and yellow or greenish with trichomoniasis.  A bad vaginal  odor.  The odor is fishy with bacterial vaginosis.  Vaginal itching, pain, or swelling.  Painful intercourse.  Pain or burning when urinating. Sometimes, there are no symptoms. TREATMENT  Treatment will vary depending on the type of infection.   Bacterial vaginosis and trichomoniasis are often treated with antibiotic creams or pills.  Yeast infections are often treated with antifungal medicines, such as vaginal creams or suppositories.  Viral vaginitis has no cure, but symptoms can be treated with medicines that relieve discomfort. Your sexual partner should be treated as well.  Atrophic vaginitis may be treated with an estrogen cream, pill, suppository, or vaginal ring. If vaginal dryness occurs, lubricants and moisturizing creams may help. You may be told to avoid scented soaps, sprays, or douches.  Allergic vaginitis treatment involves quitting the use of the product that is causing the problem. Vaginal creams can be used to treat the symptoms. HOME CARE INSTRUCTIONS   Take all medicines as directed by your caregiver.  Keep your genital area clean and dry. Avoid soap and only rinse the area with water.  Avoid douching. It can remove the healthy bacteria in the vagina.  Do not use tampons or have sexual intercourse until your vaginitis has been treated. Use sanitary pads while you have vaginitis.  Wipe from front to back. This avoids the spread of bacteria from the rectum to the vagina.  Let air reach your genital area.  Wear cotton underwear to decrease moisture buildup.  Avoid wearing underwear while you sleep until your vaginitis is gone.  Avoid tight pants and underwear or nylons without a cotton panel.  Take off wet clothing (especially bathing suits) as soon as possible.  Use mild, non-scented  products. Avoid using irritants, such as:  Scented feminine sprays.  Fabric softeners.  Scented detergents.  Scented tampons.  Scented soaps or bubble  baths.  Practice safe sex and use condoms. Condoms may prevent the spread of trichomoniasis and viral vaginitis. SEEK MEDICAL CARE IF:   You have abdominal pain.  You have a fever or persistent symptoms for more than 2-3 days.  You have a fever and your symptoms suddenly get worse. Document Released: 09/19/2007 Document Revised: 08/16/2012 Document Reviewed: 05/04/2012 [facility] Patient Information 2015 [place], [place]. This information is not intended to replace advice given to you by your health care provider. Make sure you discuss any questions you have with your health care provider.

## 2015-02-17 NOTE — Progress Notes (Signed)
   Subjective:    Patient ID: Dana Carpenter, female    DOB: 1975/12/08, 39 y.o.   MRN: [id number]  HPI Dana Carpenter presents to the office for the complaint of vaginal itching/irritation. She endorses that this issue started on 02/09/2015 towards the end of her period. She does not endorses any odor or vaginal discharge. She has no urinary complaints at this time. She endorses that it itches " on the outside and a little on the inside." She tried monistat on Saturday and per patient this just made it worse. Nothing has helped with the discomfort. Denies any new sex partners. Is in a monogamous relationship of two years.    Review of Systems  Constitutional: Negative.   Respiratory: Negative.   Gastrointestinal: Negative for nausea, vomiting, abdominal pain, diarrhea, constipation and abdominal distention.  Genitourinary: Positive for vaginal pain. Negative for urgency, frequency, hematuria, flank pain, decreased urine volume, vaginal bleeding, vaginal discharge, difficulty urinating, menstrual problem, pelvic pain and dyspareunia.       Vaginal irritation/itching  Musculoskeletal: Negative for myalgias and back pain.  Skin: Positive for rash.       Objective:   Physical Exam  Constitutional: She appears well-developed and well-nourished.  Neck: Normal range of motion.  Cardiovascular: Normal rate, regular rhythm and normal heart sounds.   Pulmonary/Chest: Effort normal and breath sounds normal.  Abdominal: Soft. She exhibits no distension and no mass. There is no tenderness. There is no rebound and no guarding.  Genitourinary: There is rash and tenderness on the right labia. There is tenderness on the left labia. There is erythema in the vagina. No vaginal discharge found.  Musculoskeletal: Normal range of motion.  Neurological: She is alert.  Skin: Skin is warm and dry. Rash noted.  Red rash which appears from itching around vulva  Vitals reviewed.  Whiff test negative. No vaginal  discharge noted.      Assessment & Plan:  Vaginitis - Will send wet prep and follow up with patient after results are received.  - Does not feel as though vagisil works for her. Advised that she could take tylenol/motrin for pain. Benadryl at night for itching and apply ice to the site.

## 2015-02-18 ENCOUNTER — Encounter: Payer: Self-pay | Admitting: Adult Health

## 2015-02-18 LAB — CERVICOVAGINAL ANCILLARY ONLY
WET PREP (BD AFFIRM): NEGATIVE
Wet Prep (BD Affirm): NEGATIVE
Wet Prep (BD Affirm): POSITIVE — AB

## 2015-02-18 MED ORDER — FLUCONAZOLE 150 MG PO TABS: 150.0000 mg | ORAL_TABLET | Freq: Once | ORAL | Status: DC

## 2015-02-18 NOTE — Telephone Encounter (Signed)
Wet prep shows vaginal yeast infection. Diflucan prescription sent to pharmacy.

## 2015-05-12 ENCOUNTER — Other Ambulatory Visit: Payer: Self-pay | Admitting: Adult Health

## 2015-05-12 ENCOUNTER — Telehealth: Payer: Self-pay | Admitting: *Deleted

## 2015-05-12 DIAGNOSIS — B3731 Acute candidiasis of vulva and vagina: Secondary | ICD-10-CM

## 2015-05-12 DIAGNOSIS — B373 Candidiasis of vulva and vagina: Secondary | ICD-10-CM

## 2015-05-12 MED ORDER — FLUCONAZOLE 150 MG PO TABS: 150.0000 mg | ORAL_TABLET | ORAL | Status: DC

## 2015-05-12 NOTE — Telephone Encounter (Signed)
Denial sent to pharmacy to have pt contact us for appointment.

## 2015-05-12 NOTE — Telephone Encounter (Signed)
Patient is calling requesting a refill on her Diflucan- she is having symptoms of yeast. Patient notified of Dr Glennon Mac Moore's change in practice and she will call for her annual appointment. Diflucan called to pharmacy per Dr Jodi Mourning permission.

## 2015-05-12 NOTE — Telephone Encounter (Signed)
Needs office visit prior to additional refills please.

## 2015-06-08 ENCOUNTER — Emergency Department (HOSPITAL_BASED_OUTPATIENT_CLINIC_OR_DEPARTMENT_OTHER)
Admit: 2015-06-08 | Discharge: 2015-06-08 | Disposition: A | Discharge status: Home / Self Care | Payer: Managed Care, Other (non HMO)

## 2015-06-08 ENCOUNTER — Encounter (HOSPITAL_COMMUNITY): Payer: Self-pay | Admitting: *Deleted

## 2015-06-08 ENCOUNTER — Emergency Department (HOSPITAL_COMMUNITY)
Admission: EM | Admit: 2015-06-08 | Discharge: 2015-06-08 | Disposition: A | Discharge status: Home / Self Care | Payer: Managed Care, Other (non HMO) | Attending: Emergency Medicine | Admitting: Emergency Medicine

## 2015-06-08 ENCOUNTER — Inpatient Hospital Stay (HOSPITAL_COMMUNITY)
Admission: AD | Admit: 2015-06-08 | Discharge: 2015-06-08 | Disposition: A | Discharge status: Home / Self Care | Payer: Managed Care, Other (non HMO) | Source: Ambulatory Visit | Attending: Obstetrics | Admitting: Obstetrics

## 2015-06-08 ENCOUNTER — Encounter (HOSPITAL_COMMUNITY): Payer: Self-pay

## 2015-06-08 DIAGNOSIS — N920 Excessive and frequent menstruation with regular cycle: Secondary | ICD-10-CM | POA: Diagnosis not present

## 2015-06-08 DIAGNOSIS — Z86711 Personal history of pulmonary embolism: Secondary | ICD-10-CM | POA: Insufficient documentation

## 2015-06-08 DIAGNOSIS — M7989 Other specified soft tissue disorders: Secondary | ICD-10-CM

## 2015-06-08 DIAGNOSIS — K5900 Constipation, unspecified: Secondary | ICD-10-CM

## 2015-06-08 DIAGNOSIS — Z8709 Personal history of other diseases of the respiratory system: Secondary | ICD-10-CM | POA: Insufficient documentation

## 2015-06-08 DIAGNOSIS — N921 Excessive and frequent menstruation with irregular cycle: Secondary | ICD-10-CM | POA: Diagnosis not present

## 2015-06-08 DIAGNOSIS — N946 Dysmenorrhea, unspecified: Secondary | ICD-10-CM | POA: Insufficient documentation

## 2015-06-08 DIAGNOSIS — Z3202 Encounter for pregnancy test, result negative: Secondary | ICD-10-CM

## 2015-06-08 DIAGNOSIS — M545 Low back pain: Secondary | ICD-10-CM | POA: Diagnosis present

## 2015-06-08 DIAGNOSIS — N945 Secondary dysmenorrhea: Secondary | ICD-10-CM

## 2015-06-08 DIAGNOSIS — Z79899 Other long term (current) drug therapy: Secondary | ICD-10-CM | POA: Diagnosis not present

## 2015-06-08 DIAGNOSIS — Z7982 Long term (current) use of aspirin: Secondary | ICD-10-CM | POA: Diagnosis not present

## 2015-06-08 DIAGNOSIS — Z8659 Personal history of other mental and behavioral disorders: Secondary | ICD-10-CM | POA: Insufficient documentation

## 2015-06-08 LAB — WET PREP, GENITAL
CLUE CELLS WET PREP: NONE SEEN
Trich, Wet Prep: NONE SEEN
YEAST WET PREP: NONE SEEN

## 2015-06-08 LAB — URINE MICROSCOPIC-ADD ON

## 2015-06-08 LAB — CBC
HCT: 39.9 % (ref 36.0–46.0)
HCT: 41.2 % (ref 36.0–46.0)
HEMOGLOBIN: 12.7 g/dL (ref 12.0–15.0)
Hemoglobin: 13.2 g/dL (ref 12.0–15.0)
MCH: 27.7 pg (ref 26.0–34.0)
MCH: 27.9 pg (ref 26.0–34.0)
MCHC: 31.8 g/dL (ref 30.0–36.0)
MCHC: 32 g/dL (ref 30.0–36.0)
MCV: 86.9 fL (ref 78.0–100.0)
MCV: 87.1 fL (ref 78.0–100.0)
PLATELETS: 263 10*3/uL (ref 150–400)
Platelets: 256 10*3/uL (ref 150–400)
RBC: 4.59 MIL/uL (ref 3.87–5.11)
RBC: 4.73 MIL/uL (ref 3.87–5.11)
RDW: 14.6 % (ref 11.5–15.5)
RDW: 14.1 % (ref 11.5–15.5)
WBC: 6.2 10*3/uL (ref 4.0–10.5)
WBC: 8.3 10*3/uL (ref 4.0–10.5)

## 2015-06-08 LAB — I-STAT CHEM 8, ED
BUN: 12 mg/dL (ref 6–20)
CREATININE: 1 mg/dL (ref 0.44–1.00)
Calcium, Ion: 1.2 mmol/L (ref 1.12–1.23)
Chloride: 104 mmol/L (ref 101–111)
Glucose, Bld: 89 mg/dL (ref 65–99)
HEMATOCRIT: 45 % (ref 36.0–46.0)
Hemoglobin: 15.3 g/dL — ABNORMAL HIGH (ref 12.0–15.0)
Potassium: 4.1 mmol/L (ref 3.5–5.1)
Sodium: 141 mmol/L (ref 135–145)
TCO2: 25 mmol/L (ref 0–100)

## 2015-06-08 LAB — URINALYSIS, ROUTINE W REFLEX MICROSCOPIC
Bilirubin Urine: NEGATIVE
Glucose, UA: NEGATIVE mg/dL
KETONES UR: NEGATIVE mg/dL
Leukocytes, UA: NEGATIVE
NITRITE: NEGATIVE
Protein, ur: NEGATIVE mg/dL
SPECIFIC GRAVITY, URINE: 1.02 (ref 1.005–1.030)
UROBILINOGEN UA: 0.2 mg/dL (ref 0.0–1.0)
pH: 6 (ref 5.0–8.0)

## 2015-06-08 LAB — HCG, QUANTITATIVE, PREGNANCY: hCG, Beta Chain, Quant, S: <1 m[IU]/mL (ref <5)

## 2015-06-08 MED ORDER — ACETAMINOPHEN 325 MG PO TABS
650.0000 mg | ORAL_TABLET | Freq: Once | ORAL | Status: AC
  Administered 2015-06-08: 650 mg via ORAL
  Filled 2015-06-08: qty 2

## 2015-06-08 MED ORDER — IBUPROFEN 800 MG PO TABS: 800.0000 mg | ORAL_TABLET | Freq: Three times a day (TID) | ORAL | Status: DC | PRN

## 2015-06-08 MED ORDER — POLYETHYLENE GLYCOL 3350 17 G PO PACK: 17.0000 g | PACK | Freq: Two times a day (BID) | ORAL | Status: DC | PRN

## 2015-06-08 NOTE — ED Notes (Signed)
Pt presents with c/o left calf swelling. Pt reports she has not noticed her leg swelling but the nurse at [facility] reports that her calf was swelling. Pt denies any heat to that area, but reports that the area does ache off and on and her toes go numb.

## 2015-06-08 NOTE — Discharge Instructions (Signed)
Abnormal Uterine Bleeding Abnormal uterine bleeding can affect women at various stages in life, including teenagers, women in their reproductive years, pregnant women, and women who have reached menopause. Several kinds of uterine bleeding are considered abnormal, including:  Bleeding or spotting between periods.   Bleeding after sexual intercourse.   Bleeding that is heavier or more than normal.   Periods that last longer than usual.  Bleeding after menopause.  Many cases of abnormal uterine bleeding are minor and simple to treat, while others are more serious. Any type of abnormal bleeding should be evaluated by your health care provider. Treatment will depend on the cause of the bleeding. HOME CARE INSTRUCTIONS Monitor your condition for any changes. The following actions may help to alleviate any discomfort you are experiencing:  Avoid the use of tampons and douches as directed by your health care provider.  Change your pads frequently. You should get regular pelvic exams and Pap tests. Keep all follow-up appointments for diagnostic tests as directed by your health care provider.  SEEK MEDICAL CARE IF:   Your bleeding lasts more than 1 week.   You feel dizzy at times.  SEEK IMMEDIATE MEDICAL CARE IF:   You pass out.   You are changing pads every 15 to 30 minutes.   You have abdominal pain.  You have a fever.   You become sweaty or weak.   You are passing large blood clots from the vagina.   You start to feel nauseous and vomit. MAKE SURE YOU:   Understand these instructions.  Will watch your condition.  Will get help right away if you are not doing well or get worse. Document Released: 11/22/2005 Document Revised: 11/27/2013 Document Reviewed: 06/21/2013 [facility] Patient Information 2015 [place], [place]. This information is not intended to replace advice given to you by your health care provider. Make sure you discuss any questions you have with your  health care provider.  Dysmenorrhea Menstrual cramps (dysmenorrhea) are caused by the muscles of the uterus tightening (contracting) during a menstrual period. For some women, this discomfort is merely bothersome. For others, dysmenorrhea can be severe enough to interfere with everyday activities for a few days each month. Primary dysmenorrhea is menstrual cramps that last a couple of days when you start having menstrual periods or soon after. This often begins after a teenager starts having her period. As a woman gets older or has a baby, the cramps will usually lessen or disappear. Secondary dysmenorrhea begins later in life, lasts longer, and the pain may be stronger than primary dysmenorrhea. The pain may start before the period and last a few days after the period.  CAUSES  Dysmenorrhea is usually caused by an underlying problem, such as:  The tissue lining the uterus grows outside of the uterus in other areas of the body (endometriosis).  The endometrial tissue, which normally lines the uterus, is found in or grows into the muscular walls of the uterus (adenomyosis).  The pelvic blood vessels are engorged with blood just before the menstrual period (pelvic congestive syndrome).  Overgrowth of cells (polyps) in the lining of the uterus or cervix.  Falling down of the uterus (prolapse) because of loose or stretched ligaments.  Depression.  Bladder problems, infection, or inflammation.  Problems with the intestine, a tumor, or irritable bowel syndrome.  Cancer of the female organs or bladder.  A severely tipped uterus.  A very tight opening or closed cervix.  Noncancerous tumors of the uterus (fibroids).  Pelvic inflammatory disease (  PID).  Pelvic scarring (adhesions) from a previous surgery.  Ovarian cyst.  An intrauterine device (IUD) used for birth control. RISK FACTORS You may be at greater risk of dysmenorrhea if:  You are younger than age 33.  You started puberty  early.  You have irregular or heavy bleeding.  You have never given birth.  You have a family history of this problem.  You are a smoker. SIGNS AND SYMPTOMS   Cramping or throbbing pain in your lower abdomen.  Headaches.  Lower back pain.  Nausea or vomiting.  Diarrhea.  Sweating or dizziness.  Loose stools. DIAGNOSIS  A diagnosis is based on your history, symptoms, physical exam, diagnostic tests, or procedures. Diagnostic tests or procedures may include:  Blood tests.  Ultrasonography.  An examination of the lining of the uterus (dilation and curettage, D&C).  An examination inside your abdomen or pelvis with a scope (laparoscopy).  X-rays.  CT scan.  MRI.  An examination inside the bladder with a scope (cystoscopy).  An examination inside the intestine or stomach with a scope (colonoscopy, gastroscopy). TREATMENT  Treatment depends on the cause of the dysmenorrhea. Treatment may include:  Pain medicine prescribed by your health care provider.  Birth control pills or an IUD with progesterone hormone in it.  Hormone replacement therapy.  Nonsteroidal anti-inflammatory drugs (NSAIDs). These may help stop the production of prostaglandins.  Surgery to remove adhesions, endometriosis, ovarian cyst, or fibroids.  Removal of the uterus (hysterectomy).  Progesterone shots to stop the menstrual period.  Cutting the nerves on the sacrum that go to the female organs (presacral neurectomy).  Electric current to the sacral nerves (sacral nerve stimulation).  Antidepressant medicine.  Psychiatric therapy, counseling, or group therapy.  Exercise and physical therapy.  Meditation and yoga therapy.  Acupuncture. HOME CARE INSTRUCTIONS   Only take over-the-counter or prescription medicines as directed by your health care provider.  Place a heating pad or hot water bottle on your lower back or abdomen. Do not sleep with the heating pad.  Use aerobic  exercises, walking, swimming, biking, and other exercises to help lessen the cramping.  Massage to the lower back or abdomen may help.  Stop smoking.  Avoid alcohol and caffeine. SEEK MEDICAL CARE IF:   Your pain does not get better with medicine.  You have pain with sexual intercourse.  Your pain increases and is not controlled with medicines.  You have abnormal vaginal bleeding with your period.  You develop nausea or vomiting with your period that is not controlled with medicine. SEEK IMMEDIATE MEDICAL CARE IF:  You pass out.  Document Released: 11/22/2005 Document Revised: 07/25/2013 Document Reviewed: 05/10/2013 [facility] Patient Information 2015 [place], [place]. This information is not intended to replace advice given to you by your health care provider. Make sure you discuss any questions you have with your health care provider.  High-Fiber Diet Fiber is found in fruits, vegetables, and grains. A high-fiber diet encourages the addition of more whole grains, legumes, fruits, and vegetables in your diet. The recommended amount of fiber for adult males is 38 g per day. For adult females, it is 25 g per day. Pregnant and lactating women should get 28 g of fiber per day. If you have a digestive or bowel problem, ask your caregiver for advice before adding high-fiber foods to your diet. Eat a variety of high-fiber foods instead of only a select few type of foods.  PURPOSE  To increase stool bulk.  To make bowel movements  more regular to prevent constipation.  To lower cholesterol.  To prevent overeating. WHEN IS THIS DIET USED?  It may be used if you have constipation and hemorrhoids.  It may be used if you have uncomplicated diverticulosis (intestine condition) and irritable bowel syndrome.  It may be used if you need help with weight management.  It may be used if you want to add it to your diet as a protective measure against atherosclerosis, diabetes, and  cancer. SOURCES OF FIBER  Whole-grain breads and cereals.  Fruits, such as apples, oranges, bananas, berries, prunes, and pears.  Vegetables, such as green peas, carrots, sweet potatoes, beets, broccoli, cabbage, spinach, and artichokes.  Legumes, such split peas, soy, lentils.  Almonds. FIBER CONTENT IN FOODS Starches and Grains / Dietary Fiber (g)  Cheerios, 1 cup / 3 g  Corn Flakes cereal, 1 cup / 0.7 g  Rice crispy treat cereal, 1 cup / 0.3 g  Instant oatmeal (cooked),  cup / 2 g  Frosted wheat cereal, 1 cup / 5.1 g  Brown, long-grain rice (cooked), 1 cup / 3.5 g  White, long-grain rice (cooked), 1 cup / 0.6 g  Enriched macaroni (cooked), 1 cup / 2.5 g Legumes / Dietary Fiber (g)  Baked beans (canned, plain, or vegetarian),  cup / 5.2 g  Kidney beans (canned),  cup / 6.8 g  Pinto beans (cooked),  cup / 5.5 g Breads and Crackers / Dietary Fiber (g)  Plain or honey graham crackers, 2 squares / 0.7 g  Saltine crackers, 3 squares / 0.3 g  Plain, salted pretzels, 10 pieces / 1.8 g  Whole-wheat bread, 1 slice / 1.9 g  White bread, 1 slice / 0.7 g  Raisin bread, 1 slice / 1.2 g  Plain bagel, 3 oz / 2 g  Flour tortilla, 1 oz / 0.9 g  Corn tortilla, 1 small / 1.5 g  Hamburger or hotdog bun, 1 small / 0.9 g Fruits / Dietary Fiber (g)  Apple with skin, 1 medium / 4.4 g  Sweetened applesauce,  cup / 1.5 g  Banana,  medium / 1.5 g  Grapes, 10 grapes / 0.4 g  Orange, 1 small / 2.3 g  Raisin, 1.5 oz / 1.6 g  Melon, 1 cup / 1.4 g Vegetables / Dietary Fiber (g)  Green beans (canned),  cup / 1.3 g  Carrots (cooked),  cup / 2.3 g  Broccoli (cooked),  cup / 2.8 g  Peas (cooked),  cup / 4.4 g  Mashed potatoes,  cup / 1.6 g  Lettuce, 1 cup / 0.5 g  Corn (canned),  cup / 1.6 g  Tomato,  cup / 1.1 g Document Released: 11/22/2005 Document Revised: 05/23/2012 Document Reviewed: 02/24/2012 [employer] Patient Information 2015 [place],  [place]. This information is not intended to replace advice given to you by your health care provider. Make sure you discuss any questions you have with your health care provider.

## 2015-06-08 NOTE — MAU Provider Note (Signed)
Chief Complaint: Vaginal Bleeding  First Provider Initiated Contact with Patient 06/08/15 1508.      SUBJECTIVE HPI: Dana Carpenter is a 39 y.o. [phone] female by LMP who presents to Maternity Admissions reporting +home UPT Wednesday, June 29 she took because she was 2 days late for her menstrual period. Start spotting last night and started bleeding heavier than a normal period today. Also reports low abdominal cramping and low back pain that she rated 7/10 on pain scale. Pain improves with Tylenol and ibuprofen. No relationship to voiding, defecation or position change. She's not had intercourse since the symptoms started. She is sexually active and not on any birth control. She has a history of PE in 2014. Patient denied that she was on any hormone therapy or recently postop, but upon review of her chart she was on an estrogen-containing OCP when the PE occurred. He was on Xaralto, but has been off for 7 months. She normally has monthly cycles with mild to moderate dysmenorrhea, moderate bleeding lasting 3-4 days. He states the bleeding today as heavier than normal and is darker red blood with small clots.  Past Medical History  Diagnosis Date  . Strep pharyngitis   . PE (pulmonary embolism) 03/26/2013    RT LOWER LOBE  . Depression   . Anxiety    OB History  Gravida Para Term Preterm AB SAB TAB Ectopic Multiple Living  4 4 4       4     # Outcome Date GA Lbr Len/2nd Weight Sex Delivery Anes PTL Lv  4 Term 2011 [redacted]w[redacted]d  7 lb (3.175 kg) [place]  3 Term 2008 [redacted]w[redacted]d  7 lb (3.175 kg) M Vag-Spont   Y  2 Term 1999 [redacted]w[redacted]d  7 lb (3.175 kg) M Vag-Spont   Y  1 Term 1997 [redacted]w[redacted]d  7 lb (3.175 kg) M Vag-Spont   Y     Past Surgical History  Procedure Laterality Date  . No past surgeries    . Wisdom tooth extraction Bilateral    History   Social History  . Marital Status: Single    Spouse Name: N/A  . Number of Children: N/A  . Years of Education: N/A   Occupational History  . Not on  file.   Social History Main Topics  . Smoking status: Never Smoker   . Smokeless tobacco: Never Used     Comment: never used tobacco  . Alcohol Use: No     Comment: occasional  . Drug Use: No  . Sexual Activity:    Partners: Male    Patent examiner Protection: None   Other Topics Concern  . Not on file   Social History Narrative   Lives with 2 sons [name] 2008, [name] 2011   Older son born 13- in and out of the home   Boyfriend, but single   Works in [employer]   Enjoys sleeping.   Completed some college   Sister lives locally, most of her family in [place] and has 2 aunts in [place]   No current facility-administered medications on file prior to encounter.   Current Outpatient Prescriptions on File Prior to Encounter  Medication Sig Dispense Refill  . Multiple Vitamins-Minerals (MULTIVITAMIN WITH MINERALS) tablet Take 1 tablet by mouth daily.    Marland Kitchen omeprazole (PRILOSEC) 40 MG capsule Take 1 capsule (40 mg total) by mouth daily. 30 capsule 3  . spironolactone (ALDACTONE) 25 MG tablet Take 25 mg by mouth 2 (  two) times daily.     . fluconazole (DIFLUCAN) 150 MG tablet Take 1 tablet (150 mg total) by mouth once. (Patient not taking: Reported on 06/08/2015) 2 tablet 0  . fluconazole (DIFLUCAN) 150 MG tablet Take 1 tablet (150 mg total) by mouth every other day. (Patient not taking: Reported on 06/08/2015) 2 tablet 0   No Known Allergies  Review of Systems  Constitutional: Negative for fever and chills.  Respiratory: Negative for cough, hemoptysis and shortness of breath.   Cardiovascular: Positive for leg swelling. Negative for chest pain.  Gastrointestinal: Positive for nausea, abdominal pain and constipation (no BM 2 days. Took laxative without relief.). Negative for vomiting.  Genitourinary: Negative for dysuria, urgency, frequency, hematuria and flank pain.  Neurological: Negative for weakness.    OBJECTIVE Blood pressure 123/72, pulse 64,  temperature 98.2 F (36.8 C), temperature source Oral, resp. rate 18, last menstrual period 05/01/2015. GENERAL: Well-developed, well-nourished female in no acute distress.  no pallor . HEART: normal rate RESP: normal effort GI: Abdomen Mildly distended, mild, diffuse low abdominal tenderness. Negative rebound tenderness, mass or guarding. Positive  bowel sounds 4. MS: Nontender, 1+ edema of left lower extremity. Left calf measures 44 cm and right Measures 41 cm. Low back mildly tender. Normal range of motion.  NEURO: Alert and oriented SPECULUM EXAM: NEFG,  small amount of dark red blood noted, cervix clean BIMANUAL: cervix  Closederus normal size, no adnexal tenderness or masses. No cervical motion tenderness.   LAB RESULTS Results for orders placed or performed during the hospital encounter of 06/08/15 (from the past 24 hour(s))  Urinalysis, Routine w reflex microscopic (not at [facility])     Status: Abnormal   Collection Time: 06/08/15  1:45 PM  Result Value Ref Range   Color, Urine YELLOW YELLOW   APPearance CLEAR CLEAR   Specific Gravity, Urine 1.020 1.005 - 1.030   pH 6.0 5.0 - 8.0   Glucose, UA NEGATIVE NEGATIVE mg/dL   Hgb urine dipstick LARGE (A) NEGATIVE   Bilirubin Urine NEGATIVE NEGATIVE   Ketones, ur NEGATIVE NEGATIVE mg/dL   Protein, ur NEGATIVE NEGATIVE mg/dL   Urobilinogen, UA 0.2 0.0 - 1.0 mg/dL   Nitrite NEGATIVE NEGATIVE   Leukocytes, UA NEGATIVE NEGATIVE  Urine microscopic-add on     Status: Abnormal   Collection Time: 06/08/15  1:45 PM  Result Value Ref Range   Squamous Epithelial / LPF FEW (A) RARE   WBC, UA 0-2 <3 WBC/hpf   RBC / HPF 21-50 <3 RBC/hpf   Bacteria, UA FEW (A) RARE   Urine-Other MUCOUS PRESENT   hCG, quantitative, pregnancy     Status: None   Collection Time: 06/08/15  2:00 PM  Result Value Ref Range   hCG, Beta Chain, Quant, S <1 <5 mIU/mL  CBC     Status: None   Collection Time: 06/08/15  2:00 PM  Result Value Ref Range   WBC 6.2 4.0 -  10.5 K/uL   RBC 4.59 3.87 - 5.11 MIL/uL   Hemoglobin 12.7 12.0 - 15.0 g/dL   HCT 39.9 36.0 - 46.0 %   MCV 86.9 78.0 - 100.0 fL   MCH 27.7 26.0 - 34.0 pg   MCHC 31.8 30.0 - 36.0 g/dL   RDW 14.6 11.5 - 15.5 %   Platelets 256 150 - 400 K/uL  Wet prep, genital     Status: Abnormal   Collection Time: 06/08/15  4:05 PM  Result Value Ref Range   Yeast Wet Prep HPF  POC NONE SEEN NONE SEEN   Trich, Wet Prep NONE SEEN NONE SEEN   Clue Cells Wet Prep HPF POC NONE SEEN NONE SEEN   WBC, Wet Prep HPF POC FEW (A) NONE SEEN    IMAGING No results found.  MAU COURSE UPT negative. Quant, CBC ordered. Quant negative CBC and vaginal bleeding stable. Declines pain meds. Discussed patient's conflicting pregnancy test, bleeding, pain and report of calf swelling with Dr. Jodi Mourning. No further evaluation of bleeding and dysmenorrhea needed at this time, but he recommends patient go to [facility] long ED for further evaluation of calf swelling especially considering her history of PE. Discussed plan of care with patient. She is agreeable, but declines to go by [facility] and prefers to go by private vehicle. I believe she is stable to do so and is very very motivated to have calf Swelling evaluated. Discussed with Dr. Melany Guernsey as was a long ED. Doppler studies can be done up to 7 PM. Patient encouraged to go immediately.  ASSESSMENT 1. Menorrhagia with irregular cycle   2. Secondary dysmenorrhea   3. Calf swelling   4. History of pulmonary embolism   5. Constipation, unspecified constipation type   6. Negative pregnancy test    PLAN Transfer to [facility] ED by private vehicle. PE precautions. Bleeding precautions.  Follow-up Information    Follow up with [facility].   Specialty:  Emergency Medicine   Why:  Immediately for evaluation of calf swelling.   Contact information:   [address] [id number] [place] [phone]       Follow up with Shelly Bombard, MD.   Specialty:  Obstetrics and Gynecology   Why:  For No improvement in menstrual cramps and heavy bleeding in 2-3 days and for routine gynecology care   Contact information:   [address] [place] [address] [phone]         Medication List    STOP taking these medications        fluconazole 150 MG tablet  Commonly known as:  DIFLUCAN      TAKE these medications        BC FAST PAIN RELIEF 650-195-33.3 MG Pack  Generic drug:  Aspirin-Salicylamide-Caffeine  Take 1 packet by mouth daily.     ibuprofen 800 MG tablet  Commonly known as:  ADVIL,MOTRIN  Take 1 tablet (800 mg total) by mouth every 8 (eight) hours as needed for cramping.     multivitamin with minerals tablet  Take 1 tablet by mouth daily.     omeprazole 40 MG capsule  Commonly known as:  PRILOSEC  Take 1 capsule (40 mg total) by mouth daily.     polyethylene glycol packet  Commonly known as:  MIRALAX  Take 17 g by mouth 2 (two) times daily as needed.     spironolactone 25 MG tablet  Commonly known as:  ALDACTONE  Take 25 mg by mouth 2 (two) times daily.       [place], CNM 06/08/2015  3:07 PM

## 2015-06-08 NOTE — Discharge Instructions (Signed)
Return to the ED with any concerns including chest pain, difficulty breathing, fainting, decreased level of alertness/lethargy, or any other alarming symptoms

## 2015-06-08 NOTE — MAU Note (Signed)
Pt presents to MAU with complaints of vaginal bleeding with clots since this morning. + HPT was on Wednesday June the 29th.

## 2015-06-08 NOTE — ED Provider Notes (Signed)
CSN: [id number]     Arrival date & time 06/08/15  1733 History   First MD Initiated Contact with Patient 06/08/15 1744     Chief Complaint  Patient presents with  . Leg Swelling     (Consider location/radiation/quality/duration/timing/severity/associated sxs/prior Treatment) HPI  Pt  With hx of PE presenting with left calf swelling.  Pt states she hasn't noted the swelling in the past but a provider at [facility] hospital today noted the swelling.  The left calf measures 3cm larger than the right.  Pt does describe an aching pain in her left calf at times.  Denies chest pain or shortness of breath.  Pt states she finished treatment for PE approx 8 months ago- was on xarelto.  There are no other associated systemic symptoms, there are no other alleviating or modifying factors.   Past Medical History  Diagnosis Date  . Strep pharyngitis   . PE (pulmonary embolism) 03/26/2013    RT LOWER LOBE  . Depression   . Anxiety    Past Surgical History  Procedure Laterality Date  . No past surgeries    . Wisdom tooth extraction Bilateral    Family History  Problem Relation Age of Onset  . Breast cancer Paternal Grandmother   . Diabetes Paternal Grandfather   . Prostate cancer Paternal Grandfather   . Hypertension Father   . Heart disease Maternal Grandfather   . Healthy Sister     x 1  . Diabetes Sister 47    type II  . Hypertension Sister   . Heart attack Mother 58    deceased- also had lung disorder (? pulmonary fibrosis)   History  Substance Use Topics  . Smoking status: Never Smoker   . Smokeless tobacco: Never Used     Comment: never used tobacco  . Alcohol Use: No     Comment: occasional   OB History    Gravida Para Term Preterm AB TAB SAB Ectopic Multiple Living   4 4 4       4      Review of Systems  ROS reviewed and all otherwise negative except for mentioned in HPI    Allergies  Review of patient's allergies indicates no known allergies.  Home Medications    Prior to Admission medications   Medication Sig Start Date End Date Taking? Authorizing Provider  Aspirin-Salicylamide-Caffeine (BC FAST PAIN RELIEF) 650-195-33.3 MG PACK Take 1 packet by mouth daily as needed (headache/pain).    Yes Historical Provider, MD  Multiple Vitamins-Minerals (MULTIVITAMIN WITH MINERALS) tablet Take 1 tablet by mouth daily.   Yes Historical Provider, MD  omeprazole (PRILOSEC) 40 MG capsule Take 1 capsule (40 mg total) by mouth daily. 02/11/15  Yes [name], NP  spironolactone (ALDACTONE) 25 MG tablet Take 25 mg by mouth 2 (two) times daily.    Yes Historical Provider, MD  ibuprofen (ADVIL,MOTRIN) 800 MG tablet Take 1 tablet (800 mg total) by mouth every 8 (eight) hours as needed for cramping. Patient not taking: Reported on 06/08/2015 06/08/15   Manya Silvas, CNM  polyethylene glycol [facility]) packet Take 17 g by mouth 2 (two) times daily as needed. Patient not taking: Reported on 06/08/2015 06/08/15   Manya Silvas, CNM   BP 119/74 mmHg  Pulse 72  Temp(Src) 97.6 F (36.4 C) (Oral)  Resp 18  SpO2 100%  LMP 06/08/2015  Vitals reviewed Physical Exam  Physical Examination: General appearance - alert, well appearing, and in no distress Mental status - alert, oriented to  person, place, and time Eyes - no conjunctival injection, no scleral icterus Mouth - mucous membranes moist, pharynx normal without lesions Chest - clear to auscultation, no wheezes, rales or rhonchi, symmetric air entry Heart - normal rate, regular rhythm, normal S1, S2, no murmurs, rubs, clicks or gallops Abdomen - soft, nontender, nondistended, no masses or organomegaly Neurological - alert, oriented Extremities - peripheral pulses normal, no pedal edema, no clubbing or cyanosis, increased calf size of left side compared to right calf, negative homan's sign, no palpable cords or tenderness of popliteal fossa Skin - normal coloration and turgor, no rashes,  ED Course  Procedures  (including critical care time) Labs Review Labs Reviewed  I-STAT CHEM 8, ED - Abnormal; Notable for the following:    Hemoglobin 15.3 (*)    All other components within normal limits  CBC    Imaging Review No results found.   EKG Interpretation None      MDM   Final diagnoses:  Swelling of lower extremity    Pt with hx of PE presenting with left calf swelling, labs reassuring.  DVT study negative.  Results d/w patient at bedside.  Discharged with strict return precautions.  Pt agreeable with plan.    Alfonzo Beers, MD 06/08/15 2041

## 2015-06-08 NOTE — Progress Notes (Signed)
VASCULAR LAB PRELIMINARY  PRELIMINARY  PRELIMINARY  PRELIMINARY  Left lower extremity venous Doppler completed.    Preliminary report:  There is no DVT or SVT noted in the left lower extremity.   Rhea Thrun, RVT 06/08/2015, 7:48 PM

## 2015-06-10 LAB — GC/CHLAMYDIA PROBE AMP (~~LOC~~) NOT AT ARMC
Chlamydia: NEGATIVE
NEISSERIA GONORRHEA: NEGATIVE

## 2015-06-27 ENCOUNTER — Telehealth: Payer: Self-pay | Admitting: Family

## 2015-06-27 MED ORDER — OMEPRAZOLE 40 MG PO CPDR: 40.0000 mg | DELAYED_RELEASE_CAPSULE | Freq: Every day | ORAL | Status: DC

## 2015-06-27 NOTE — Telephone Encounter (Signed)
Refill sent.

## 2015-06-27 NOTE — Telephone Encounter (Signed)
Caller name: Nnenna Relation to pt: Call back number: [phone] Pharmacy: [facility] [address]  Reason for call:   Patient is requesting omeprazole refill

## 2015-07-17 ENCOUNTER — Telehealth: Payer: Self-pay | Admitting: *Deleted

## 2015-07-17 DIAGNOSIS — B379 Candidiasis, unspecified: Secondary | ICD-10-CM

## 2015-07-17 MED ORDER — FLUCONAZOLE 200 MG PO TABS: 200.0000 mg | ORAL_TABLET | ORAL | Status: DC

## 2015-07-17 NOTE — Telephone Encounter (Signed)
Patient contacted the office stating she has vaginal irritation and itching. Denies any odor. Patient is requesting a prescription and states she prefers pills.  Patient advised prescription sent to her pharmacy. Patient advised she needs to call the office and schedule an annual exam. Patient verbalized understanding.

## 2015-08-15 ENCOUNTER — Ambulatory Visit: Payer: Managed Care, Other (non HMO) | Admitting: Family

## 2015-09-03 ENCOUNTER — Telehealth: Payer: Self-pay | Admitting: Family

## 2015-09-03 NOTE — Telephone Encounter (Signed)
Pt was no show 08/15/15 1:15pm, 6 month follow up appt, pt has not rescheduled, 2nd no show LBSW this year, charge for no show?

## 2015-09-03 NOTE — Telephone Encounter (Signed)
Yes please

## 2015-09-18 ENCOUNTER — Ambulatory Visit (HOSPITAL_BASED_OUTPATIENT_CLINIC_OR_DEPARTMENT_OTHER)
Admission: RE | Admit: 2015-09-18 | Discharge: 2015-09-18 | Disposition: A | Discharge status: Home / Self Care | Payer: Managed Care, Other (non HMO) | Source: Ambulatory Visit | Attending: Medical | Admitting: Medical

## 2015-09-18 ENCOUNTER — Ambulatory Visit (INDEPENDENT_AMBULATORY_CARE_PROVIDER_SITE_OTHER): Payer: Managed Care, Other (non HMO) | Admitting: Medical

## 2015-09-18 ENCOUNTER — Encounter: Payer: Self-pay | Admitting: Medical

## 2015-09-18 VITALS — BP 110/70 | HR 82 | Temp 98.4°F | Ht 68.0 in | Wt 215.0 lb

## 2015-09-18 DIAGNOSIS — M25562 Pain in left knee: Secondary | ICD-10-CM | POA: Diagnosis present

## 2015-09-18 DIAGNOSIS — M79605 Pain in left leg: Secondary | ICD-10-CM

## 2015-09-18 DIAGNOSIS — R600 Localized edema: Secondary | ICD-10-CM | POA: Insufficient documentation

## 2015-09-18 MED ORDER — KETOROLAC TROMETHAMINE 60 MG/2ML IM SOLN
60.0000 mg | Freq: Once | INTRAMUSCULAR | Status: AC
  Administered 2015-09-18: 60 mg via INTRAMUSCULAR

## 2015-09-18 MED ORDER — OMEPRAZOLE 40 MG PO CPDR: 40.0000 mg | DELAYED_RELEASE_CAPSULE | Freq: Every day | ORAL | Status: DC

## 2015-09-18 NOTE — Addendum Note (Signed)
Addended by: Tasia Catchings on: 09/18/2015 06:59 PM   Modules accepted: Orders

## 2015-09-18 NOTE — Progress Notes (Signed)
Pre visit review using our clinic review tool, if applicable. No additional management support is needed unless otherwise documented below in the visit note. 

## 2015-09-18 NOTE — Patient Instructions (Addendum)
For your knee pain. We give toradol 60 mg.  Rx diclofenac rx nsaid to take 1 tab twice daily.  For severe pain rx norco 5/325.  Will get xray of knee today, uric acid, cbc and sed rate.  Also will go ahead and order [place] of lower ext. Test to be done at 4:30 pm.  Follow up in 7 days or as needed.  If test negative but pain persists then would refer to ortho to evaluate internal derangement.

## 2015-09-18 NOTE — Progress Notes (Signed)
Subjective:    Patient ID: Dana Carpenter, female    DOB: [birth year], 39 y.o.   MRN: [id number]  HPI  Pt in with left knee pain. States at first was dull ache about on week ago. Yesterday when pain became severe. No fall, no injury or trauma.   Pt states in past would have mild pain on and off. Often times brief only for about an hour of so for only one week.  No fevers,no chills or sweats.  Pt tried ibuprofen 800 mg this am. Did not help much at all.  LMP- September 25th.     Review of Systems  Constitutional: Negative for fever, chills and fatigue.  Respiratory: Negative for cough, chest tightness, shortness of breath and wheezing.   Cardiovascular: Negative for chest pain and palpitations.  Musculoskeletal:       Knee pain.  Neurological: Negative for dizziness, numbness and headaches.  Hematological: Negative for adenopathy. Does not bruise/bleed easily.  Psychiatric/Behavioral: Negative for behavioral problems and confusion.    Past Medical History  Diagnosis Date  . Strep pharyngitis   . PE (pulmonary embolism) 03/26/2013    RT LOWER LOBE  . Depression   . Anxiety     Social History   Social History  . Marital Status: Single    Spouse Name: N/A  . Number of Children: N/A  . Years of Education: N/A   Occupational History  . Not on file.   Social History Main Topics  . Smoking status: Never Smoker   . Smokeless tobacco: Never Used     Comment: never used tobacco  . Alcohol Use: No     Comment: occasional  . Drug Use: No  . Sexual Activity:    Partners: Male    Patent examiner Protection: None   Other Topics Concern  . Not on file   Social History Narrative   Lives with 2 sons [name] 2008, [name] 2011   Older son born 15- in and out of the home   Boyfriend, but single   Works in [employer]   Enjoys sleeping.   Completed some college   Sister lives locally, most of her family in [place] and has 2 aunts in [place]     Past Surgical History  Procedure Laterality Date  . No past surgeries    . Wisdom tooth extraction Bilateral     Family History  Problem Relation Age of Onset  . Breast cancer Paternal Grandmother   . Diabetes Paternal Grandfather   . Prostate cancer Paternal Grandfather   . Hypertension Father   . Heart disease Maternal Grandfather   . Healthy Sister     x 1  . Diabetes Sister 93    type II  . Hypertension Sister   . Heart attack Mother 70    deceased- also had lung disorder (? pulmonary fibrosis)    No Known Allergies  Current Outpatient Prescriptions on File Prior to Visit  Medication Sig Dispense Refill  . Aspirin-Salicylamide-Caffeine (BC FAST PAIN RELIEF) 650-195-33.3 MG PACK Take 1 packet by mouth daily as needed (headache/pain).     . fluconazole (DIFLUCAN) 200 MG tablet Take 1 tablet (200 mg total) by mouth every other day. 2 tablet 2  . ibuprofen (ADVIL,MOTRIN) 800 MG tablet Take 1 tablet (800 mg total) by mouth every 8 (eight) hours as needed for cramping. 30 tablet 0  . Multiple Vitamins-Minerals (MULTIVITAMIN WITH MINERALS) tablet Take 1 tablet by mouth daily.  No current facility-administered medications on file prior to visit.    BP 110/70 mmHg  Pulse 82  Temp(Src) 98.4 F (36.9 C) (Oral)  Ht 5\' 8"  (1.727 m)  Wt 215 lb (97.523 kg)  BMI 32.70 kg/m2  SpO2 98%       Objective:   Physical Exam  General- No acute distress. Pleasant patient. Neck- Full range of motion, no jvd Lungs- Clear, even and unlabored. Heart- regular rate and rhythm. Neurologic- CNII- XII grossly intact.  Left knee- no severe swelling. But hold her leg in fully extended position. If she were to flex she states would cause excruciating pain. Pain on palpation medial aspect over tibial plateau region causes extreme pain. Left calf mild swollen compare to rt side. Negative homans sign.       Assessment & Plan:  For your knee pain. We give toradol 60 mg.  Rx  diclofenac rx nsaid to take 1 tab twice daily.  For severe pain rx norco 5/325.  Will get xray of knee today, uric acid, cbc and sed rate.  Also will go ahead and order [place] of lower ext.  Follow up in 7 days or as needed.  If test negative but pain persists then would refer to ortho to evaluate internal derangement

## 2015-09-19 LAB — CBC WITH DIFFERENTIAL/PLATELET
BASOS PCT: 0.5 % (ref 0.0–3.0)
Basophils Absolute: 0 10*3/uL (ref 0.0–0.1)
EOS PCT: 5.7 % — AB (ref 0.0–5.0)
Eosinophils Absolute: 0.5 10*3/uL (ref 0.0–0.7)
HEMATOCRIT: 40.2 % (ref 36.0–46.0)
HEMOGLOBIN: 12.8 g/dL (ref 12.0–15.0)
LYMPHS PCT: 19.5 % (ref 12.0–46.0)
Lymphs Abs: 1.7 10*3/uL (ref 0.7–4.0)
MCHC: 31.9 g/dL (ref 30.0–36.0)
MCV: 84.9 fl (ref 78.0–100.0)
MONO ABS: 0.5 10*3/uL (ref 0.1–1.0)
MONOS PCT: 5.4 % (ref 3.0–12.0)
Neutro Abs: 6.1 10*3/uL (ref 1.4–7.7)
Neutrophils Relative %: 68.9 % (ref 43.0–77.0)
Platelets: 210 10*3/uL (ref 150.0–400.0)
RBC: 4.73 Mil/uL (ref 3.87–5.11)
RDW: 14.4 % (ref 11.5–15.5)
WBC: 8.8 10*3/uL (ref 4.0–10.5)

## 2015-09-19 LAB — SEDIMENTATION RATE: Sed Rate: 15 mm/hr (ref 0–22)

## 2015-09-19 LAB — URIC ACID: Uric Acid, Serum: 5.5 mg/dL (ref 2.4–7.0)

## 2015-09-24 ENCOUNTER — Encounter: Payer: Self-pay | Admitting: Family

## 2015-09-24 ENCOUNTER — Ambulatory Visit (INDEPENDENT_AMBULATORY_CARE_PROVIDER_SITE_OTHER): Payer: Managed Care, Other (non HMO) | Admitting: Family

## 2015-09-24 VITALS — BP 111/66 | HR 71 | Temp 98.4°F | Ht 68.0 in | Wt 214.2 lb

## 2015-09-24 DIAGNOSIS — M25562 Pain in left knee: Secondary | ICD-10-CM | POA: Diagnosis not present

## 2015-09-24 LAB — CBC WITH DIFFERENTIAL/PLATELET
BASOS PCT: 0.8 % (ref 0.0–3.0)
Basophils Absolute: 0.1 10*3/uL (ref 0.0–0.1)
EOS PCT: 5.3 % — AB (ref 0.0–5.0)
Eosinophils Absolute: 0.5 10*3/uL (ref 0.0–0.7)
HCT: 40.4 % (ref 36.0–46.0)
Hemoglobin: 12.8 g/dL (ref 12.0–15.0)
LYMPHS ABS: 2.1 10*3/uL (ref 0.7–4.0)
Lymphocytes Relative: 23.7 % (ref 12.0–46.0)
MCHC: 31.7 g/dL (ref 30.0–36.0)
MCV: 85.8 fl (ref 78.0–100.0)
MONOS PCT: 7.4 % (ref 3.0–12.0)
Monocytes Absolute: 0.6 10*3/uL (ref 0.1–1.0)
NEUTROS PCT: 62.8 % (ref 43.0–77.0)
Neutro Abs: 5.5 10*3/uL (ref 1.4–7.7)
Platelets: 251 10*3/uL (ref 150.0–400.0)
RBC: 4.71 Mil/uL (ref 3.87–5.11)
RDW: 14.3 % (ref 11.5–15.5)
WBC: 8.7 10*3/uL (ref 4.0–10.5)

## 2015-09-24 LAB — SEDIMENTATION RATE: SED RATE: 16 mm/h (ref 0–22)

## 2015-09-24 NOTE — Progress Notes (Signed)
Subjective:    Patient ID: Dana Carpenter, female    DOB: Nov 02, 1976, 39 y.o.   MRN: [id number]  HPI  Dana Carpenter is a 39 yr old female who presents today with chief complaint of L knee pain.  Reports that she had similar symptoms in the left leg back in July.  Pain is better, but swelling is worse. She reports that times she has some pain with weight bearing. No significant improvement with NSAIDS. Denies CP/SOB or calf pain. She did have a bilateral LE doppler which was negative for DVT last week.    Review of Systems    see HPI  Past Medical History  Diagnosis Date  . Strep pharyngitis   . PE (pulmonary embolism) 03/26/2013    RT LOWER LOBE  . Depression   . Anxiety     Social History   Social History  . Marital Status: Single    Spouse Name: N/A  . Number of Children: N/A  . Years of Education: N/A   Occupational History  . Not on file.   Social History Main Topics  . Smoking status: Never Smoker   . Smokeless tobacco: Never Used     Comment: never used tobacco  . Alcohol Use: No     Comment: occasional  . Drug Use: No  . Sexual Activity:    Partners: Male    Patent examiner Protection: None   Other Topics Concern  . Not on file   Social History Narrative   Lives with 2 sons Dana Carpenter 2008, Dana Carpenter 2011   Older son born 81- in and out of the home   Boyfriend, but single   Works in [employer]   Enjoys sleeping.   Completed some college   Sister lives locally, most of her family in [place] and has 2 aunts in [place]    Past Surgical History  Procedure Laterality Date  . No past surgeries    . Wisdom tooth extraction Bilateral     Family History  Problem Relation Age of Onset  . Breast cancer Paternal Grandmother   . Diabetes Paternal Grandfather   . Prostate cancer Paternal Grandfather   . Hypertension Father   . Heart disease Maternal Grandfather   . Healthy Sister     x 1  . Diabetes Sister 57    type II  .  Hypertension Sister   . Heart attack Mother 62    deceased- also had lung disorder (? pulmonary fibrosis)    No Known Allergies  Current Outpatient Prescriptions on File Prior to Visit  Medication Sig Dispense Refill  . Aspirin-Salicylamide-Caffeine (BC FAST PAIN RELIEF) 650-195-33.3 MG PACK Take 1 packet by mouth daily as needed (headache/pain).     Marland Kitchen ibuprofen (ADVIL,MOTRIN) 800 MG tablet Take 1 tablet (800 mg total) by mouth every 8 (eight) hours as needed for cramping. 30 tablet 0  . Multiple Vitamins-Minerals (MULTIVITAMIN WITH MINERALS) tablet Take 1 tablet by mouth daily.    Marland Kitchen omeprazole (PRILOSEC) 40 MG capsule Take 1 capsule (40 mg total) by mouth daily. 30 capsule 5   No current facility-administered medications on file prior to visit.    BP 111/66 mmHg  Pulse 71  Temp(Src) 98.4 F (36.9 C) (Oral)  Ht '5\' 8"'  (1.727 m)  Wt 214 lb 3.2 oz (97.16 kg)  BMI 32.58 kg/m2  SpO2 99%  LMP 08/31/2015    Objective:   Physical Exam  Constitutional: She is oriented to person, place, and time.  She appears well-developed and well-nourished.  HENT:  Head: Normocephalic and atraumatic.  Cardiovascular: Normal rate, regular rhythm and normal heart sounds.   No murmur heard. Pulmonary/Chest: Effort normal and breath sounds normal. No respiratory distress. She has no wheezes.  Musculoskeletal:  1+ LLE edema, mild swelling left knee.  No erythema, tenderness or instability of left knee on exam.   Neurological: She is alert and oriented to person, place, and time.  Psychiatric: She has a normal mood and affect. Her behavior is normal. Judgment and thought content normal.          Assessment & Plan:  L knee pain/swelling- uncontrolled. refer to sports medicine.  Obtain ANA, RA, ESR, CBC to further evaluate for underlying infection or autoimmune etiology.

## 2015-09-24 NOTE — Patient Instructions (Signed)
Please complete lab work prior to leaving. You will be contacted about your referral to sports medicine.  

## 2015-09-24 NOTE — Progress Notes (Signed)
Pre visit review using our clinic review tool, if applicable. No additional management support is needed unless otherwise documented below in the visit note. 

## 2015-09-25 ENCOUNTER — Telehealth: Payer: Self-pay | Admitting: Family

## 2015-09-25 ENCOUNTER — Encounter: Payer: Self-pay | Admitting: Family

## 2015-09-25 DIAGNOSIS — K219 Gastro-esophageal reflux disease without esophagitis: Secondary | ICD-10-CM

## 2015-09-25 LAB — RHEUMATOID FACTOR

## 2015-09-25 LAB — ANA: Anti Nuclear Antibody(ANA): NEGATIVE

## 2015-09-25 NOTE — Telephone Encounter (Signed)
Pt calling for referrals to GI and Rheumatology.  Wants to go to Dr. Tonette Bihari office [phone] for Rheumatology Wants to go to [place].  She said she discussed it yesterday at appt and was told she didn't need a referral. Both provider offices told her she needs a new referral from our office.

## 2015-09-25 NOTE — Telephone Encounter (Signed)
Please advise 

## 2015-09-29 ENCOUNTER — Ambulatory Visit (INDEPENDENT_AMBULATORY_CARE_PROVIDER_SITE_OTHER): Payer: Managed Care, Other (non HMO) | Admitting: Family Medicine

## 2015-09-29 ENCOUNTER — Encounter: Payer: Self-pay | Admitting: Family Medicine

## 2015-09-29 VITALS — BP 122/79 | HR 79 | Ht 67.0 in | Wt 215.0 lb

## 2015-09-29 DIAGNOSIS — M25562 Pain in left knee: Secondary | ICD-10-CM | POA: Diagnosis not present

## 2015-09-29 NOTE — Telephone Encounter (Signed)
See mychart message:    I reviewed all of your autoimmune testing and it was negative. At the time of your visit I placed a referral to sports medicine for you knee pain. At this point, I would recommend that you follow through with sports medicine first as I don't think that rheumatology would have a lot to offer you at this point. We can consider rheumatology later if symptoms don't improve. Also, you are requesting a referral to Gastroenterology? What symptoms are you having?

## 2015-09-29 NOTE — Telephone Encounter (Signed)
Notified pt of below recommendations and she voices understanding and is agreeable to proceed with sports medicine referral. Pt states we referred her to GI when we initially saw her due to GERD, burning in stomach despite daily use of omeprazole 40mg . States we wanted her to be evaluated for possible ulcer but pt did not keep appt.  Please advise.

## 2015-09-29 NOTE — Patient Instructions (Signed)
You have patellofemoral syndrome Avoid painful activities when possible (often deep squats, lunges bother this) Cross train with swimming, cycling with low resistance, elliptical if needed. Straight leg raise, hip side raises, straight leg raises with foot turned outwards 3 sets of 10 once a day. Add ankle weight if thesee become too easy. Consider formal physical therapy Correct foot breakdown with something like dr. Zoe Lan active series. Avoid flat shoes, barefoot walking as much as possible the next 6 weeks. Icing 15 minutes at a time 3-4 times a day as needed. Tylenol or aleve as needed for pain Follow up with me in 6 weeks.

## 2015-09-29 NOTE — Telephone Encounter (Signed)
Melissa-- please advise referrals?

## 2015-09-30 NOTE — Addendum Note (Signed)
Addended by: [name] on: 09/30/2015 01:10 PM   Modules accepted: Orders

## 2015-10-01 DIAGNOSIS — M25562 Pain in left knee: Secondary | ICD-10-CM | POA: Insufficient documentation

## 2015-10-01 NOTE — Assessment & Plan Note (Signed)
Consistent with patellofemoral syndrome.  Shown home exercises to do daily.  Icing, arch supports.  Tylenol or nsaids as needed.  Cross training.  Discussed slow return to running program.  Also reviewed line drill to address external rotation.  F/u in 6 weeks.

## 2015-10-01 NOTE — Progress Notes (Signed)
PCP and asked to see patient in consultation at the request of: O'SULLIVAN,MELISSA S., NP   Subjective:   HPI: Patient is a 39 y.o. female here for left knee pain.  Patient reports she's had left knee pain for 3 weeks. No known injury or trauma. Pain currently 0/10. Pain worse with squatting, increased running. Pain is anterior, can be throbbing. + swelling that has since resolved. No skin changes, fever, other complaints. No catching, locking, giving out.  Past Medical History  Diagnosis Date  . Strep pharyngitis   . PE (pulmonary embolism) 03/26/2013    RT LOWER LOBE  . Depression   . Anxiety     Current Outpatient Prescriptions on File Prior to Visit  Medication Sig Dispense Refill  . Aspirin-Salicylamide-Caffeine (BC FAST PAIN RELIEF) 650-195-33.3 MG PACK Take 1 packet by mouth daily as needed (headache/pain).     Marland Kitchen ibuprofen (ADVIL,MOTRIN) 800 MG tablet Take 1 tablet (800 mg total) by mouth every 8 (eight) hours as needed for cramping. 30 tablet 0  . Multiple Vitamins-Minerals (MULTIVITAMIN WITH MINERALS) tablet Take 1 tablet by mouth daily.    Marland Kitchen omeprazole (PRILOSEC) 40 MG capsule Take 1 capsule (40 mg total) by mouth daily. 30 capsule 5   No current facility-administered medications on file prior to visit.    Past Surgical History  Procedure Laterality Date  . No past surgeries    . Wisdom tooth extraction Bilateral     No Known Allergies  Social History   Social History  . Marital Status: Single    Spouse Name: N/A  . Number of Children: N/A  . Years of Education: N/A   Occupational History  . Not on file.   Social History Main Topics  . Smoking status: Never Smoker   . Smokeless tobacco: Never Used     Comment: never used tobacco  . Alcohol Use: No     Comment: occasional  . Drug Use: No  . Sexual Activity:    Partners: Male    Patent examiner Protection: None   Other Topics Concern  . Not on file   Social History Narrative   Lives with 2  sons [name] 2008, [name] 2011   Older son born 53- in and out of the home   Boyfriend, but single   Works in [employer]   Enjoys sleeping.   Completed some college   Sister lives locally, most of her family in [place] and has 2 aunts in [place]    Family History  Problem Relation Age of Onset  . Breast cancer Paternal Grandmother   . Diabetes Paternal Grandfather   . Prostate cancer Paternal Grandfather   . Hypertension Father   . Heart disease Maternal Grandfather   . Healthy Sister     x 1  . Diabetes Sister 21    type II  . Hypertension Sister   . Heart attack Mother 45    deceased- also had lung disorder (? pulmonary fibrosis)    BP 122/79 mmHg  Pulse 79  Ht 5\' 7"  (1.702 m)  Wt 215 lb (97.523 kg)  BMI 33.67 kg/m2  LMP 08/31/2015  Review of Systems: See HPI above.    Objective:  Physical Exam:  Gen: NAD, appears comfortable.  Gait: Increased external rotation of left ankle compared to right with walking and running gaits.  Left knee: No gross deformity, ecchymoses, swelling.  Mild VMO atrophy.  Mild overpronation. No TTP. FROM.  Hip abduction 5/5 strength. Negative ant/post drawers.  Negative valgus/varus testing. Negative lachmanns. Negative mcmurrays, apleys, patellar apprehension. NV intact distally.  Right knee: FROM without pain.    Assessment & Plan:  1. Left knee pain:  Consistent with patellofemoral syndrome.  Shown home exercises to do daily.  Icing, arch supports.  Tylenol or nsaids as needed.  Cross training.  Discussed slow return to running program.  Also reviewed line drill to address external rotation.  F/u in 6 weeks.

## 2015-10-02 ENCOUNTER — Encounter: Payer: Self-pay | Admitting: Internal Medicine

## 2015-10-06 ENCOUNTER — Encounter: Payer: Self-pay | Admitting: Obstetrics & Gynecology

## 2015-10-06 ENCOUNTER — Ambulatory Visit (INDEPENDENT_AMBULATORY_CARE_PROVIDER_SITE_OTHER): Payer: Managed Care, Other (non HMO) | Admitting: Obstetrics & Gynecology

## 2015-10-06 ENCOUNTER — Encounter: Payer: Managed Care, Other (non HMO) | Admitting: Obstetrics & Gynecology

## 2015-10-06 VITALS — BP 124/68 | HR 64 | Ht 66.0 in | Wt 216.0 lb

## 2015-10-06 DIAGNOSIS — N926 Irregular menstruation, unspecified: Secondary | ICD-10-CM

## 2015-10-06 DIAGNOSIS — N979 Female infertility, unspecified: Secondary | ICD-10-CM

## 2015-10-06 NOTE — Progress Notes (Signed)
CLINIC ENCOUNTER NOTE  History:  39 y.o. [id number] here today for menstrual problem.  Had one episode of having two periods in a month, normal period since but noted dark, red blood during cycle.  No other problems. She denies any current abnormal vaginal discharge, bleeding, pelvic pain or other acute concerns.   Patient also mentioned that she wants to know if it was okay to attempt conception with her husband; she has a history of PE. No pregnancy in last three years despite not using contraception.   Had history of two medically treated ectopic pregnancies in distant past, but two subsequent pregnancies after these.  Husband has two other children.  Past Medical History  Diagnosis Date  . Strep pharyngitis   . PE (pulmonary embolism) 03/26/2013    RT LOWER LOBE  . Depression   . Anxiety     Past Surgical History  Procedure Laterality Date  . Wisdom tooth extraction Bilateral     The following portions of the patient's history were reviewed and updated as appropriate: allergies, current medications, past family history, past medical history, past social history, past surgical history and problem list.   Health Maintenance:  Normal pap 5 years ago.  Review of Systems:  Pertinent items noted in HPI and remainder of comprehensive ROS otherwise negative.  Objective:  Physical Exam BP 124/68 mmHg  Pulse 64  Ht 5\' 6"  (1.676 m)  Wt 216 lb (97.977 kg)  BMI 34.88 kg/m2  LMP 09/24/2015 CONSTITUTIONAL: Well-developed, well-nourished female in no acute distress.  HENT:  Normocephalic, atraumatic. External right and left ear normal. Oropharynx is clear and moist EYES: Conjunctivae and EOM are normal. Pupils are equal, round, and reactive to light. No scleral icterus.  NECK: Normal range of motion, supple, no masses SKIN: Skin is warm and dry. No rash noted. Not diaphoretic. No erythema. No pallor. [place]: Alert and oriented to person, place, and time. Normal reflexes, muscle tone  coordination. No cranial nerve deficit noted. PSYCHIATRIC: Normal mood and affect. Normal behavior. Normal judgment and thought content. CARDIOVASCULAR: Normal heart rate noted RESPIRATORY: Effort and breath sounds normal, no problems with respiration noted ABDOMEN: Soft, no distention noted.   PELVIC: Deferred MUSCULOSKELETAL: Normal range of motion. No edema noted.   Assessment & Plan:  1. Menstrual cycle problem Isolated episode of two periods in one month. Patient reassured that one episode can be caused by stress, significant weight loss, exercise or anything can disturb the hypothalamic-pituitary-ovarian axis.  No intense work up indicated unless persistent for >3 months. Told to keep records of her menses, will continue to observe.    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  If patient desires further evaluation; the preferred next step will be referral to Dr. Kerin Perna or [facility] in [facility].  If not, can check and follow up on infertility workup labs, semen analysis then HSG.  Will continue to follow.  If patient does conceive, she was advised it will be a high-risk pregnancy given history of PE and AMA; will likely need anticoagulation and antenatal monitoring.   Please refer to After Visit Summary for other counseling recommendations.   Return in about 4 weeks (around 11/03/2015) for Annual exam and pap smear and follow up.   Total face-to-face time with patient: 20 minutes. Over 50% of encounter was spent on counseling and coordination of care.   Verita Schneiders, MD, [place] Attending [place], [facility] for [place]

## 2015-10-15 ENCOUNTER — Ambulatory Visit (INDEPENDENT_AMBULATORY_CARE_PROVIDER_SITE_OTHER): Payer: Managed Care, Other (non HMO) | Admitting: Family Medicine

## 2015-10-15 ENCOUNTER — Encounter: Payer: Self-pay | Admitting: Family Medicine

## 2015-10-15 VITALS — BP 128/72 | HR 68 | Ht 66.0 in | Wt 215.0 lb

## 2015-10-15 DIAGNOSIS — Z124 Encounter for screening for malignant neoplasm of cervix: Secondary | ICD-10-CM | POA: Diagnosis not present

## 2015-10-15 DIAGNOSIS — Z1151 Encounter for screening for human papillomavirus (HPV): Secondary | ICD-10-CM

## 2015-10-15 DIAGNOSIS — Z01419 Encounter for gynecological examination (general) (routine) without abnormal findings: Secondary | ICD-10-CM | POA: Diagnosis not present

## 2015-10-15 NOTE — Progress Notes (Signed)
  Subjective:     Dana Carpenter is a 39 y.o. female and is here for a comprehensive physical exam. The patient reports no problems.  PGM has history of breast cancer.  Paternal aunts also has history of cancer.   Social History   Social History  . Marital Status: Single    Spouse Name: N/A  . Number of Children: N/A  . Years of Education: N/A   Occupational History  . Not on file.   Social History Main Topics  . Smoking status: Never Smoker   . Smokeless tobacco: Never Used     Comment: never used tobacco  . Alcohol Use: No     Comment: occasional  . Drug Use: No  . Sexual Activity:    Partners: Male    Patent examiner Protection: None   Other Topics Concern  . Not on file   Social History Narrative   Lives with 2 sons Dana Carpenter 2008, Dana Carpenter 2011   Older son born 51- in and out of the home   Boyfriend, but single   Works in [employer]   Enjoys sleeping.   Completed some college   Sister lives locally, most of her family in [place] and has 2 aunts in [place] Maintenance  Topic Date Due  . PAP SMEAR  04/05/2016  . INFLUENZA VACCINE  07/06/2016  . TETANUS/TDAP  12/06/2016  . HIV Screening  Completed    The following portions of the patient's history were reviewed and updated as appropriate: allergies, current medications, past family history, past medical history, past social history, past surgical history and problem list.  Review of Systems A comprehensive review of systems was negative.   Objective:    BP 128/72 mmHg  Pulse 68  Ht 5\' 6"  (1.676 m)  Wt 215 lb (97.523 kg)  BMI 34.72 kg/m2  LMP 09/24/2015 General appearance: alert, cooperative and no distress Head: Normocephalic, without obvious abnormality, atraumatic Eyes: conjunctivae/corneas clear. PERRL, EOM's intact. Fundi benign. Neck: no adenopathy, no carotid bruit, no JVD, supple, symmetrical, trachea midline and thyroid not enlarged, symmetric, no  tenderness/mass/nodules Lungs: clear to auscultation bilaterally Breasts: normal appearance, no masses or tenderness Heart: regular rate and rhythm, S1, S2 normal, no murmur, click, rub or gallop Abdomen: soft, non-tender; bowel sounds normal; no masses,  no organomegaly Pelvic: cervix normal in appearance, external genitalia normal, no adnexal masses or tenderness, no cervical motion tenderness, rectovaginal septum normal, uterus normal size, shape, and consistency and vagina normal without discharge Extremities: extremities normal, atraumatic, no cyanosis or edema Pulses: 2+ and symmetric Skin: Skin color, texture, turgor normal. No rashes or lesions Lymph nodes: Cervical, supraclavicular, and axillary nodes normal. Neurologic: Alert and oriented X 3, normal strength and tone. Normal symmetric reflexes. Normal coordination and gait    Assessment:    Healthy female exam. PAP done today.  As patient       Plan:      Patient sent home with Risk assessment sheet for Hereditary Cancer Syndromes Continue exercise, healthy food choices. See After Visit Summary for Counseling Recommendations

## 2015-10-15 NOTE — Patient Instructions (Signed)
Preventive Care for Adults, Female A healthy lifestyle and preventive care can promote health and wellness. Preventive health guidelines for women include the following key practices.  A routine yearly physical is a good way to check with your health care provider about your health and preventive screening. It is a chance to share any concerns and updates on your health and to receive a thorough exam.  Visit your dentist for a routine exam and preventive care every 6 months. Brush your teeth twice a day and floss once a day. Good oral hygiene prevents tooth decay and gum disease.  The frequency of eye exams is based on your age, health, family medical history, use of contact lenses, and other factors. Follow your health care provider's recommendations for frequency of eye exams.  Eat a healthy diet. Foods like vegetables, fruits, whole grains, low-fat dairy products, and lean protein foods contain the nutrients you need without too many calories. Decrease your intake of foods high in solid fats, added sugars, and salt. Eat the right amount of calories for you.Get information about a proper diet from your health care provider, if necessary.  Regular physical exercise is one of the most important things you can do for your health. Most adults should get at least 150 minutes of moderate-intensity exercise (any activity that increases your heart rate and causes you to sweat) each week. In addition, most adults need muscle-strengthening exercises on 2 or more days a week.  Maintain a healthy weight. The body mass index (BMI) is a screening tool to identify possible weight problems. It provides an estimate of body fat based on height and weight. Your health care provider can find your BMI and can help you achieve or maintain a healthy weight.For adults 20 years and older:  A BMI below 18.5 is considered underweight.  A BMI of 18.5 to 24.9 is normal.  A BMI of 25 to 29.9 is considered overweight.  A  BMI of 30 and above is considered obese.  Maintain normal blood lipids and cholesterol levels by exercising and minimizing your intake of saturated fat. Eat a balanced diet with plenty of fruit and vegetables. Blood tests for lipids and cholesterol should begin at age 45 and be repeated every 5 years. If your lipid or cholesterol levels are high, you are over 50, or you are at high risk for heart disease, you may need your cholesterol levels checked more frequently.Ongoing high lipid and cholesterol levels should be treated with medicines if diet and exercise are not working.  If you smoke, find out from your health care provider how to quit. If you do not use tobacco, do not start.  Lung cancer screening is recommended for adults aged 45-80 years who are at high risk for developing lung cancer because of a history of smoking. A yearly low-dose CT scan of the lungs is recommended for people who have at least a 30-pack-year history of smoking and are a current smoker or have quit within the past 15 years. A pack year of smoking is smoking an average of 1 pack of cigarettes a day for 1 year (for example: 1 pack a day for 30 years or 2 packs a day for 15 years). Yearly screening should continue until the smoker has stopped smoking for at least 15 years. Yearly screening should be stopped for people who develop a health problem that would prevent them from having lung cancer treatment.  If you are pregnant, do not drink alcohol. If you are  breastfeeding, be very cautious about drinking alcohol. If you are not pregnant and choose to drink alcohol, do not have more than 1 drink per day. One drink is considered to be 12 ounces (355 mL) of beer, 5 ounces (148 mL) of wine, or 1.5 ounces (44 mL) of liquor.  Avoid use of street drugs. Do not share needles with anyone. Ask for help if you need support or instructions about stopping the use of drugs.  High blood pressure causes heart disease and increases the risk  of stroke. Your blood pressure should be checked at least every 1 to 2 years. Ongoing high blood pressure should be treated with medicines if weight loss and exercise do not work.  If you are 55-79 years old, ask your health care provider if you should take aspirin to prevent strokes.  Diabetes screening is done by taking a blood sample to check your blood glucose level after you have not eaten for a certain period of time (fasting). If you are not overweight and you do not have risk factors for diabetes, you should be screened once every 3 years starting at age 45. If you are overweight or obese and you are 40-70 years of age, you should be screened for diabetes every year as part of your cardiovascular risk assessment.  Breast cancer screening is essential preventive care for women. You should practice "breast self-awareness." This means understanding the normal appearance and feel of your breasts and may include breast self-examination. Any changes detected, no matter how small, should be reported to a health care provider. Women in their 20s and 30s should have a clinical breast exam (CBE) by a health care provider as part of a regular health exam every 1 to 3 years. After age 40, women should have a CBE every year. Starting at age 40, women should consider having a mammogram (breast X-ray test) every year. Women who have a family history of breast cancer should talk to their health care provider about genetic screening. Women at a high risk of breast cancer should talk to their health care providers about having an MRI and a mammogram every year.  Breast cancer gene (BRCA)-related cancer risk assessment is recommended for women who have family members with BRCA-related cancers. BRCA-related cancers include breast, ovarian, tubal, and peritoneal cancers. Having family members with these cancers may be associated with an increased risk for harmful changes (mutations) in the breast cancer genes BRCA1 and  BRCA2. Results of the assessment will determine the need for genetic counseling and BRCA1 and BRCA2 testing.  Your health care provider may recommend that you be screened regularly for cancer of the pelvic organs (ovaries, uterus, and vagina). This screening involves a pelvic examination, including checking for microscopic changes to the surface of your cervix (Pap test). You may be encouraged to have this screening done every 3 years, beginning at age 21.  For women ages 30-65, health care providers may recommend pelvic exams and Pap testing every 3 years, or they may recommend the Pap and pelvic exam, combined with testing for human papilloma virus (HPV), every 5 years. Some types of HPV increase your risk of cervical cancer. Testing for HPV may also be done on women of any age with unclear Pap test results.  Other health care providers may not recommend any screening for nonpregnant women who are considered low risk for pelvic cancer and who do not have symptoms. Ask your health care provider if a screening pelvic exam is right for   you.  If you have had past treatment for cervical cancer or a condition that could lead to cancer, you need Pap tests and screening for cancer for at least 20 years after your treatment. If Pap tests have been discontinued, your risk factors (such as having a new sexual partner) need to be reassessed to determine if screening should resume. Some women have medical problems that increase the chance of getting cervical cancer. In these cases, your health care provider may recommend more frequent screening and Pap tests.  Colorectal cancer can be detected and often prevented. Most routine colorectal cancer screening begins at the age of 50 years and continues through age 75 years. However, your health care provider may recommend screening at an earlier age if you have risk factors for colon cancer. On a yearly basis, your health care provider may provide home test kits to check  for hidden blood in the stool. Use of a small camera at the end of a tube, to directly examine the colon (sigmoidoscopy or colonoscopy), can detect the earliest forms of colorectal cancer. Talk to your health care provider about this at age 50, when routine screening begins. Direct exam of the colon should be repeated every 5-10 years through age 75 years, unless early forms of precancerous polyps or small growths are found.  People who are at an increased risk for hepatitis B should be screened for this virus. You are considered at high risk for hepatitis B if:  You were born in a country where hepatitis B occurs often. Talk with your health care provider about which countries are considered high risk.  Your parents were born in a high-risk country and you have not received a shot to protect against hepatitis B (hepatitis B vaccine).  You have HIV or AIDS.  You use needles to inject street drugs.  You live with, or have sex with, someone who has hepatitis B.  You get hemodialysis treatment.  You take certain medicines for conditions like cancer, organ transplantation, and autoimmune conditions.  Hepatitis C blood testing is recommended for all people born from 1945 through 1965 and any individual with known risks for hepatitis C.  Practice safe sex. Use condoms and avoid high-risk sexual practices to reduce the spread of sexually transmitted infections (STIs). STIs include gonorrhea, chlamydia, syphilis, trichomonas, herpes, HPV, and human immunodeficiency virus (HIV). Herpes, HIV, and HPV are viral illnesses that have no cure. They can result in disability, cancer, and death.  You should be screened for sexually transmitted illnesses (STIs) including gonorrhea and chlamydia if:  You are sexually active and are younger than 24 years.  You are older than 24 years and your health care provider tells you that you are at risk for this type of infection.  Your sexual activity has changed  since you were last screened and you are at an increased risk for chlamydia or gonorrhea. Ask your health care provider if you are at risk.  If you are at risk of being infected with HIV, it is recommended that you take a prescription medicine daily to prevent HIV infection. This is called preexposure prophylaxis (PrEP). You are considered at risk if:  You are sexually active and do not regularly use condoms or know the HIV status of your partner(s).  You take drugs by injection.  You are sexually active with a partner who has HIV.  Talk with your health care provider about whether you are at high risk of being infected with HIV. If   you choose to begin PrEP, you should first be tested for HIV. You should then be tested every 3 months for as long as you are taking PrEP.  Osteoporosis is a disease in which the bones lose minerals and strength with aging. This can result in serious bone fractures or breaks. The risk of osteoporosis can be identified using a bone density scan. Women ages 67 years and over and women at risk for fractures or osteoporosis should discuss screening with their health care providers. Ask your health care provider whether you should take a calcium supplement or vitamin D to reduce the rate of osteoporosis.  Menopause can be associated with physical symptoms and risks. Hormone replacement therapy is available to decrease symptoms and risks. You should talk to your health care provider about whether hormone replacement therapy is right for you.  Use sunscreen. Apply sunscreen liberally and repeatedly throughout the day. You should seek shade when your shadow is shorter than you. Protect yourself by wearing long sleeves, pants, a wide-brimmed hat, and sunglasses year round, whenever you are outdoors.  Once a month, do a whole body skin exam, using a mirror to look at the skin on your back. Tell your health care provider of new moles, moles that have irregular borders, moles that  are larger than a pencil eraser, or moles that have changed in shape or color.  Stay current with required vaccines (immunizations).  Influenza vaccine. All adults should be immunized every year.  Tetanus, diphtheria, and acellular pertussis (Td, Tdap) vaccine. Pregnant women should receive 1 dose of Tdap vaccine during each pregnancy. The dose should be obtained regardless of the length of time since the last dose. Immunization is preferred during the 27th-36th week of gestation. An adult who has not previously received Tdap or who does not know her vaccine status should receive 1 dose of Tdap. This initial dose should be followed by tetanus and diphtheria toxoids (Td) booster doses every 10 years. Adults with an unknown or incomplete history of completing a 3-dose immunization series with Td-containing vaccines should begin or complete a primary immunization series including a Tdap dose. Adults should receive a Td booster every 10 years.  Varicella vaccine. An adult without evidence of immunity to varicella should receive 2 doses or a second dose if she has previously received 1 dose. Pregnant females who do not have evidence of immunity should receive the first dose after pregnancy. This first dose should be obtained before leaving the health care facility. The second dose should be obtained 4-8 weeks after the first dose.  Human papillomavirus (HPV) vaccine. Females aged 13-26 years who have not received the vaccine previously should obtain the 3-dose series. The vaccine is not recommended for use in pregnant females. However, pregnancy testing is not needed before receiving a dose. If a female is found to be pregnant after receiving a dose, no treatment is needed. In that case, the remaining doses should be delayed until after the pregnancy. Immunization is recommended for any person with an immunocompromised condition through the age of 61 years if she did not get any or all doses earlier. During the  3-dose series, the second dose should be obtained 4-8 weeks after the first dose. The third dose should be obtained 24 weeks after the first dose and 16 weeks after the second dose.  Zoster vaccine. One dose is recommended for adults aged 30 years or older unless certain conditions are present.  Measles, mumps, and rubella (MMR) vaccine. Adults born  before 1957 generally are considered immune to measles and mumps. Adults born in 1957 or later should have 1 or more doses of MMR vaccine unless there is a contraindication to the vaccine or there is laboratory evidence of immunity to each of the three diseases. A routine second dose of MMR vaccine should be obtained at least 28 days after the first dose for students attending postsecondary schools, health care workers, or international travelers. People who received inactivated measles vaccine or an unknown type of measles vaccine during 1963-1967 should receive 2 doses of MMR vaccine. People who received inactivated mumps vaccine or an unknown type of mumps vaccine before 1979 and are at high risk for mumps infection should consider immunization with 2 doses of MMR vaccine. For females of childbearing age, rubella immunity should be determined. If there is no evidence of immunity, females who are not pregnant should be vaccinated. If there is no evidence of immunity, females who are pregnant should delay immunization until after pregnancy. Unvaccinated health care workers born before 1957 who lack laboratory evidence of measles, mumps, or rubella immunity or laboratory confirmation of disease should consider measles and mumps immunization with 2 doses of MMR vaccine or rubella immunization with 1 dose of MMR vaccine.  Pneumococcal 13-valent conjugate (PCV13) vaccine. When indicated, a person who is uncertain of his immunization history and has no record of immunization should receive the PCV13 vaccine. All adults 65 years of age and older should receive this  vaccine. An adult aged 19 years or older who has certain medical conditions and has not been previously immunized should receive 1 dose of PCV13 vaccine. This PCV13 should be followed with a dose of pneumococcal polysaccharide (PPSV23) vaccine. Adults who are at high risk for pneumococcal disease should obtain the PPSV23 vaccine at least 8 weeks after the dose of PCV13 vaccine. Adults older than 39 years of age who have normal immune system function should obtain the PPSV23 vaccine dose at least 1 year after the dose of PCV13 vaccine.  Pneumococcal polysaccharide (PPSV23) vaccine. When PCV13 is also indicated, PCV13 should be obtained first. All adults aged 65 years and older should be immunized. An adult younger than age 65 years who has certain medical conditions should be immunized. Any person who resides in a nursing home or long-term care facility should be immunized. An adult smoker should be immunized. People with an immunocompromised condition and certain other conditions should receive both PCV13 and PPSV23 vaccines. People with human immunodeficiency virus (HIV) infection should be immunized as soon as possible after diagnosis. Immunization during chemotherapy or radiation therapy should be avoided. Routine use of PPSV23 vaccine is not recommended for American Indians, [place] Natives, or people younger than 65 years unless there are medical conditions that require PPSV23 vaccine. When indicated, people who have unknown immunization and have no record of immunization should receive PPSV23 vaccine. One-time revaccination 5 years after the first dose of PPSV23 is recommended for people aged 19-64 years who have chronic kidney failure, nephrotic syndrome, asplenia, or immunocompromised conditions. People who received 1-2 doses of PPSV23 before age 65 years should receive another dose of PPSV23 vaccine at age 65 years or later if at least 5 years have passed since the previous dose. Doses of PPSV23 are not  needed for people immunized with PPSV23 at or after age 65 years.  Meningococcal vaccine. Adults with asplenia or persistent complement component deficiencies should receive 2 doses of quadrivalent meningococcal conjugate (MenACWY-D) vaccine. The doses should be obtained   at least 2 months apart. Microbiologists working with certain meningococcal bacteria, [place] recruits, people at risk during an outbreak, and people who travel to or live in countries with a high rate of meningitis should be immunized. A first-year college student up through age 34 years who is living in a residence hall should receive a dose if she did not receive a dose on or after her 16th birthday. Adults who have certain high-risk conditions should receive one or more doses of vaccine.  Hepatitis A vaccine. Adults who wish to be protected from this disease, have certain high-risk conditions, work with hepatitis A-infected animals, work in hepatitis A research labs, or travel to or work in countries with a high rate of hepatitis A should be immunized. Adults who were previously unvaccinated and who anticipate close contact with an international adoptee during the first 60 days after arrival in the [place] from a country with a high rate of hepatitis A should be immunized.  Hepatitis B vaccine. Adults who wish to be protected from this disease, have certain high-risk conditions, may be exposed to blood or other infectious body fluids, are household contacts or sex partners of hepatitis B positive people, are clients or workers in certain care facilities, or travel to or work in countries with a high rate of hepatitis B should be immunized.  Haemophilus influenzae type b (Hib) vaccine. A previously unvaccinated person with asplenia or sickle cell disease or having a scheduled splenectomy should receive 1 dose of Hib vaccine. Regardless of previous immunization, a recipient of a hematopoietic stem cell transplant should receive a  3-dose series 6-12 months after her successful transplant. Hib vaccine is not recommended for adults with HIV infection. Preventive Services / Frequency Ages 35 to 4 years  Blood pressure check.** / Every 3-5 years.  Lipid and cholesterol check.** / Every 5 years beginning at age 60.  Clinical breast exam.** / Every 3 years for women in their 71s and 10s.  BRCA-related cancer risk assessment.** / For women who have family members with a BRCA-related cancer (breast, ovarian, tubal, or peritoneal cancers).  Pap test.** / Every 2 years from ages 76 through 26. Every 3 years starting at age 61 through age 76 or 93 with a history of 3 consecutive normal Pap tests.  HPV screening.** / Every 3 years from ages 37 through ages 60 to 51 with a history of 3 consecutive normal Pap tests.  Hepatitis C blood test.** / For any individual with known risks for hepatitis C.  Skin self-exam. / Monthly.  Influenza vaccine. / Every year.  Tetanus, diphtheria, and acellular pertussis (Tdap, Td) vaccine.** / Consult your health care provider. Pregnant women should receive 1 dose of Tdap vaccine during each pregnancy. 1 dose of Td every 10 years.  Varicella vaccine.** / Consult your health care provider. Pregnant females who do not have evidence of immunity should receive the first dose after pregnancy.  HPV vaccine. / 3 doses over 6 months, if 93 and younger. The vaccine is not recommended for use in pregnant females. However, pregnancy testing is not needed before receiving a dose.  Measles, mumps, rubella (MMR) vaccine.** / You need at least 1 dose of MMR if you were born in 1957 or later. You may also need a 2nd dose. For females of childbearing age, rubella immunity should be determined. If there is no evidence of immunity, females who are not pregnant should be vaccinated. If there is no evidence of immunity, females who are  pregnant should delay immunization until after pregnancy.  Pneumococcal  13-valent conjugate (PCV13) vaccine.** / Consult your health care provider.  Pneumococcal polysaccharide (PPSV23) vaccine.** / 1 to 2 doses if you smoke cigarettes or if you have certain conditions.  Meningococcal vaccine.** / 1 dose if you are age 68 to 8 years and a Market researcher living in a residence hall, or have one of several medical conditions, you need to get vaccinated against meningococcal disease. You may also need additional booster doses.  Hepatitis A vaccine.** / Consult your health care provider.  Hepatitis B vaccine.** / Consult your health care provider.  Haemophilus influenzae type b (Hib) vaccine.** / Consult your health care provider. Ages 7 to 53 years  Blood pressure check.** / Every year.  Lipid and cholesterol check.** / Every 5 years beginning at age 25 years.  Lung cancer screening. / Every year if you are aged 11-80 years and have a 30-pack-year history of smoking and currently smoke or have quit within the past 15 years. Yearly screening is stopped once you have quit smoking for at least 15 years or develop a health problem that would prevent you from having lung cancer treatment.  Clinical breast exam.** / Every year after age 48 years.  BRCA-related cancer risk assessment.** / For women who have family members with a BRCA-related cancer (breast, ovarian, tubal, or peritoneal cancers).  Mammogram.** / Every year beginning at age 41 years and continuing for as long as you are in good health. Consult with your health care provider.  Pap test.** / Every 3 years starting at age 65 years through age 37 or 70 years with a history of 3 consecutive normal Pap tests.  HPV screening.** / Every 3 years from ages 72 years through ages 60 to 40 years with a history of 3 consecutive normal Pap tests.  Fecal occult blood test (FOBT) of stool. / Every year beginning at age 21 years and continuing until age 5 years. You may not need to do this test if you get  a colonoscopy every 10 years.  Flexible sigmoidoscopy or colonoscopy.** / Every 5 years for a flexible sigmoidoscopy or every 10 years for a colonoscopy beginning at age 35 years and continuing until age 48 years.  Hepatitis C blood test.** / For all people born from [birth year] through 1965 and any individual with known risks for hepatitis C.  Skin self-exam. / Monthly.  Influenza vaccine. / Every year.  Tetanus, diphtheria, and acellular pertussis (Tdap/Td) vaccine.** / Consult your health care provider. Pregnant women should receive 1 dose of Tdap vaccine during each pregnancy. 1 dose of Td every 10 years.  Varicella vaccine.** / Consult your health care provider. Pregnant females who do not have evidence of immunity should receive the first dose after pregnancy.  Zoster vaccine.** / 1 dose for adults aged 30 years or older.  Measles, mumps, rubella (MMR) vaccine.** / You need at least 1 dose of MMR if you were born in 1957 or later. You may also need a second dose. For females of childbearing age, rubella immunity should be determined. If there is no evidence of immunity, females who are not pregnant should be vaccinated. If there is no evidence of immunity, females who are pregnant should delay immunization until after pregnancy.  Pneumococcal 13-valent conjugate (PCV13) vaccine.** / Consult your health care provider.  Pneumococcal polysaccharide (PPSV23) vaccine.** / 1 to 2 doses if you smoke cigarettes or if you have certain conditions.  Meningococcal vaccine.** /  Consult your health care provider.  Hepatitis A vaccine.** / Consult your health care provider.  Hepatitis B vaccine.** / Consult your health care provider.  Haemophilus influenzae type b (Hib) vaccine.** / Consult your health care provider. Ages 64 years and over  Blood pressure check.** / Every year.  Lipid and cholesterol check.** / Every 5 years beginning at age 23 years.  Lung cancer screening. / Every year if you  are aged 16-80 years and have a 30-pack-year history of smoking and currently smoke or have quit within the past 15 years. Yearly screening is stopped once you have quit smoking for at least 15 years or develop a health problem that would prevent you from having lung cancer treatment.  Clinical breast exam.** / Every year after age 74 years.  BRCA-related cancer risk assessment.** / For women who have family members with a BRCA-related cancer (breast, ovarian, tubal, or peritoneal cancers).  Mammogram.** / Every year beginning at age 44 years and continuing for as long as you are in good health. Consult with your health care provider.  Pap test.** / Every 3 years starting at age 58 years through age 22 or 39 years with 3 consecutive normal Pap tests. Testing can be stopped between 65 and 70 years with 3 consecutive normal Pap tests and no abnormal Pap or HPV tests in the past 10 years.  HPV screening.** / Every 3 years from ages 64 years through ages 70 or 61 years with a history of 3 consecutive normal Pap tests. Testing can be stopped between 65 and 70 years with 3 consecutive normal Pap tests and no abnormal Pap or HPV tests in the past 10 years.  Fecal occult blood test (FOBT) of stool. / Every year beginning at age 40 years and continuing until age 27 years. You may not need to do this test if you get a colonoscopy every 10 years.  Flexible sigmoidoscopy or colonoscopy.** / Every 5 years for a flexible sigmoidoscopy or every 10 years for a colonoscopy beginning at age 7 years and continuing until age 32 years.  Hepatitis C blood test.** / For all people born from [birth year] through 1965 and any individual with known risks for hepatitis C.  Osteoporosis screening.** / A one-time screening for women ages 30 years and over and women at risk for fractures or osteoporosis.  Skin self-exam. / Monthly.  Influenza vaccine. / Every year.  Tetanus, diphtheria, and acellular pertussis (Tdap/Td)  vaccine.** / 1 dose of Td every 10 years.  Varicella vaccine.** / Consult your health care provider.  Zoster vaccine.** / 1 dose for adults aged 35 years or older.  Pneumococcal 13-valent conjugate (PCV13) vaccine.** / Consult your health care provider.  Pneumococcal polysaccharide (PPSV23) vaccine.** / 1 dose for all adults aged 46 years and older.  Meningococcal vaccine.** / Consult your health care provider.  Hepatitis A vaccine.** / Consult your health care provider.  Hepatitis B vaccine.** / Consult your health care provider.  Haemophilus influenzae type b (Hib) vaccine.** / Consult your health care provider. ** Family history and personal history of risk and conditions may change your health care provider's recommendations.   This information is not intended to replace advice given to you by your health care provider. Make sure you discuss any questions you have with your health care provider.   Document Released: 01/18/2002 Document Revised: 12/13/2014 Document Reviewed: 04/19/2011 Elsevier Interactive Patient Education [employer].

## 2015-10-16 LAB — CYTOLOGY - PAP

## 2015-10-17 ENCOUNTER — Encounter: Payer: Self-pay | Admitting: Family Medicine

## 2015-10-17 DIAGNOSIS — R87610 Atypical squamous cells of undetermined significance on cytologic smear of cervix (ASC-US): Secondary | ICD-10-CM | POA: Insufficient documentation

## 2015-10-21 ENCOUNTER — Telehealth: Payer: Self-pay | Admitting: General Practice

## 2015-10-21 NOTE — Telephone Encounter (Signed)
Patient's pap shows abnormal cells but HPV is negative. Needs repeat pap in 3 years. Called patient and discussed results and recommendations. Patient verbalized understanding to all and had no questions

## 2015-10-29 ENCOUNTER — Ambulatory Visit: Payer: Managed Care, Other (non HMO) | Admitting: Obstetrics & Gynecology

## 2015-11-11 ENCOUNTER — Ambulatory Visit: Payer: Managed Care, Other (non HMO) | Admitting: Family Medicine

## 2015-12-03 ENCOUNTER — Ambulatory Visit: Payer: Managed Care, Other (non HMO) | Admitting: Internal Medicine

## 2016-02-26 ENCOUNTER — Encounter: Payer: Self-pay | Admitting: Obstetrics & Gynecology

## 2016-02-26 ENCOUNTER — Ambulatory Visit (INDEPENDENT_AMBULATORY_CARE_PROVIDER_SITE_OTHER): Payer: Managed Care, Other (non HMO) | Admitting: Obstetrics & Gynecology

## 2016-02-26 VITALS — BP 125/78 | HR 83 | Ht 66.0 in | Wt 211.0 lb

## 2016-02-26 DIAGNOSIS — N898 Other specified noninflammatory disorders of vagina: Secondary | ICD-10-CM | POA: Diagnosis not present

## 2016-02-26 DIAGNOSIS — Z113 Encounter for screening for infections with a predominantly sexual mode of transmission: Secondary | ICD-10-CM

## 2016-02-26 NOTE — Patient Instructions (Signed)

## 2016-02-26 NOTE — Progress Notes (Signed)
Patient states she is having a discharge for one week now. Patient has notice discharge with odor.Kathrene Alu RN BSN

## 2016-02-26 NOTE — Progress Notes (Signed)
Patient ID: Dana Carpenter, female   DOB: 02/16/1976, 40 y.o.   MRN: [id number] History:  40 y.o. [id number] here today for eval of vaginal discharge and odor.  She reports that she has had sx for 1 1/2 weeks.  She has been monogamous with the same partner for 3 years.  She wants to bd screened for STI's.  Pt denies pain.  Pt reports that she has itching after shaving.  Uses shaving cream and shaves with the direction of the hair.   The following portions of the patient's history were reviewed and updated as appropriate: allergies, current medications, past family history, past medical history, past social history, past surgical history and problem list.  Review of Systems:  Pertinent items are noted in HPI.  Objective:  Physical Exam Blood pressure 125/78, pulse 83, height 5\' 6"  (1.676 m), weight 211 lb (95.709 kg). Gen: NAD Abd: Soft, nontender and nondistended Pelvic: Normal appearing external genitalia; normal appearing vaginal mucosa and cervix.  Thick white discharge.  No odor noted.  Small uterus, no other palpable masses, no uterine or adnexal tenderness   Assessment & Plan:  Leukorrhea- r/o STI and BV.  Suspect BV  Vulvar itching- consider waxing.  Also consider hydrocortisone after shaving.   Dana Schreifels L. Carpenter, M.D., Cherlynn June

## 2016-02-27 LAB — WET PREP BY MOLECULAR PROBE
CANDIDA SPECIES: NEGATIVE
Gardnerella vaginalis: NEGATIVE
TRICHOMONAS VAG: NEGATIVE

## 2016-02-27 LAB — GC/CHLAMYDIA PROBE AMP (~~LOC~~) NOT AT ARMC
Chlamydia: NEGATIVE
NEISSERIA GONORRHEA: NEGATIVE

## 2016-03-01 ENCOUNTER — Telehealth: Payer: Self-pay

## 2016-03-01 NOTE — Telephone Encounter (Signed)
She is welcome to come back in but, based on her results that is nothing to treat.  I think I gave her this before:  GO WHITE:  Dove soap (light green writing) All UNSCENTED sanitary products (tampons, pads and panty liners) 100% cotton, white underwear UNSCENTED detergent (i.e. Dreft) to wash underclothes and wash clothes 100% cotton white wash clothes  Lorraina Spring L. Harraway-Smith, M.D., Cherlynn June

## 2016-03-01 NOTE — Telephone Encounter (Signed)
Patient called and given the "go white" information and asked to try those measures and I also offered to let her be evaluated and patient declined at this time. Kathrene Alu RN BSN

## 2016-03-01 NOTE — Telephone Encounter (Signed)
Patient called and states she would like to know her results from her last visit. Patient made aware that her GC/Chl result are negative and the affirm was negative. Patient states that now she is really irritated and has started her period. Patient states that her period is "mucky and smells". Asked patient what panty liners she is using and she is using Always brand- suggested she find a non scented brand to help with irritation. Made patient aware I will route to provider for input on plan of care. Kathrene Alu RN BSN

## 2016-12-10 ENCOUNTER — Ambulatory Visit: Payer: Managed Care, Other (non HMO) | Admitting: Obstetrics and Gynecology

## 2016-12-17 ENCOUNTER — Encounter: Payer: Self-pay | Admitting: Obstetrics & Gynecology

## 2016-12-17 ENCOUNTER — Ambulatory Visit (INDEPENDENT_AMBULATORY_CARE_PROVIDER_SITE_OTHER): Payer: Managed Care, Other (non HMO) | Admitting: Obstetrics & Gynecology

## 2016-12-17 VITALS — BP 145/81 | HR 79 | Ht 66.0 in | Wt 208.0 lb

## 2016-12-17 DIAGNOSIS — N898 Other specified noninflammatory disorders of vagina: Secondary | ICD-10-CM | POA: Diagnosis not present

## 2016-12-17 DIAGNOSIS — N939 Abnormal uterine and vaginal bleeding, unspecified: Secondary | ICD-10-CM | POA: Diagnosis not present

## 2016-12-17 NOTE — Progress Notes (Addendum)
History:  41 y.o. [id number] here today for eval of vaginal discharge.  She reports that her partner felt a burning sensation after intercourse. She think s she may have had some burning but, her sx were sl and have resolved. She reports that her cycles have changed with the bleeding a bit heavier and the cycles lasting a bit longer. She and her partner do not have children together but, he has a 16 yo and she has 4 children. They are interested in achieving a pregnancy. She has menses q 28 days.   The following portions of the patient's history were reviewed and updated as appropriate: allergies, current medications, past family history, past medical history, past social history, past surgical history and problem list.  Review of Systems:  Pertinent items are noted in HPI.   Objective:  Physical Exam Blood pressure (!) 145/81, pulse 79, height 5\' 6"  (1.676 m), weight 208 lb (94.3 kg), last menstrual period 12/13/2016. Gen: NAD Lungs: CTA CV: RRR Abd: Soft, nontender and nondistended Pelvic: Normal appearing external genitalia; normal appearing vaginal mucosa and cervix.  Normal discharge. There is a small amount of blood in the vault. Small uterus, no other palpable masses, no uterine or adnexal tenderness  Labs and Imaging No results found.  Assessment & Plan:  Leukorrhea: will check KOH and wet mount  Change in cycles: suspect age related as exam in WNL Labs: Celina; estradiol level and Anti-mullerian hormone level today  Gabryella Murfin L. Harraway-Smith, M.D., Cherlynn June

## 2016-12-17 NOTE — Progress Notes (Signed)
Patient complaining of discharge for about a week. Patient also states she would like to discuss getting pregnant. Kathrene Alu RNBSN

## 2016-12-18 LAB — WET PREP BY MOLECULAR PROBE
Candida species: NEGATIVE
Gardnerella vaginalis: NEGATIVE
Trichomonas vaginosis: NEGATIVE

## 2016-12-18 LAB — FOLLICLE STIMULATING HORMONE: FSH: 11.3 m[IU]/mL

## 2016-12-18 LAB — ESTRADIOL: ESTRADIOL: 48 pg/mL

## 2016-12-21 LAB — ANTI MULLERIAN HORMONE: AMH ASSESSR: 2.4 ng/mL

## 2016-12-28 ENCOUNTER — Telehealth: Payer: Self-pay

## 2016-12-28 NOTE — Telephone Encounter (Signed)
-----   Message from Lavonia Drafts, MD sent at 12/28/2016  1:37 PM EST ----- Please notify pt that her labs are all normal.  They can continue to attempt spontaneous pregnancy or be referred to reproductive endocrinology.  With no obvious source, it is probably only a matter of time!  clh-S ----- Message ----- From: Doran Heater, RN Sent: 12/27/2016   1:37 PM To: Lavonia Drafts, MD  Patient would like labs reviewed and plan of care/results

## 2016-12-28 NOTE — Telephone Encounter (Signed)
Patient made aware of lab results. Patient given option to be referred to reproductive endocrinology or give it more time to attempt spontaneous pregnancy. Patient states they will keep attempting to get pregnant on their own. Kathrene Alu RN BSN

## 2017-01-18 ENCOUNTER — Other Ambulatory Visit: Payer: Managed Care, Other (non HMO)

## 2017-01-18 DIAGNOSIS — N912 Amenorrhea, unspecified: Secondary | ICD-10-CM

## 2017-01-18 NOTE — Progress Notes (Signed)
Patient presents for lab draw for amenorrhea.Patient has missed period and had two negative at home pregnancy tests. Kathrene Alu RNBSN

## 2017-01-19 ENCOUNTER — Telehealth: Payer: Self-pay

## 2017-01-19 LAB — BETA HCG QUANT (REF LAB): HCG QUANT: 129 m[IU]/mL

## 2017-01-19 NOTE — Telephone Encounter (Signed)
Patient called and made aware that her HCG came back positive (129) and she is about 4 weeks.  Patient scheduled for New Ob visit on March 16. Patient is taking multivitamin with an additional folic acid and told her to continue this.   Reviewed medication list and she is not taking the aldactone or zoloft or klonopin any longer. Kathrene Alu RN BSN

## 2017-01-27 ENCOUNTER — Encounter (HOSPITAL_COMMUNITY): Payer: Self-pay | Admitting: *Deleted

## 2017-01-27 ENCOUNTER — Encounter (HOSPITAL_COMMUNITY)
Admission: AD | Disposition: A | Discharge status: Home / Self Care | Payer: Self-pay | Source: Ambulatory Visit | Attending: Obstetrics and Gynecology

## 2017-01-27 ENCOUNTER — Inpatient Hospital Stay (HOSPITAL_COMMUNITY): Payer: Managed Care, Other (non HMO) | Admitting: Anesthesiology

## 2017-01-27 ENCOUNTER — Inpatient Hospital Stay (HOSPITAL_COMMUNITY)
Admission: AD | Admit: 2017-01-27 | Discharge: 2017-01-28 | Disposition: A | Discharge status: Home / Self Care | Payer: Managed Care, Other (non HMO) | Source: Ambulatory Visit | Attending: Obstetrics and Gynecology | Admitting: Obstetrics and Gynecology

## 2017-01-27 ENCOUNTER — Inpatient Hospital Stay (HOSPITAL_COMMUNITY): Payer: Managed Care, Other (non HMO)

## 2017-01-27 DIAGNOSIS — Z79899 Other long term (current) drug therapy: Secondary | ICD-10-CM | POA: Diagnosis not present

## 2017-01-27 DIAGNOSIS — Z7982 Long term (current) use of aspirin: Secondary | ICD-10-CM | POA: Insufficient documentation

## 2017-01-27 DIAGNOSIS — F419 Anxiety disorder, unspecified: Secondary | ICD-10-CM | POA: Diagnosis not present

## 2017-01-27 DIAGNOSIS — O4690 Antepartum hemorrhage, unspecified, unspecified trimester: Secondary | ICD-10-CM

## 2017-01-27 DIAGNOSIS — O99342 Other mental disorders complicating pregnancy, second trimester: Secondary | ICD-10-CM | POA: Insufficient documentation

## 2017-01-27 DIAGNOSIS — Z3A01 Less than 8 weeks gestation of pregnancy: Secondary | ICD-10-CM | POA: Insufficient documentation

## 2017-01-27 DIAGNOSIS — O00101 Right tubal pregnancy without intrauterine pregnancy: Secondary | ICD-10-CM | POA: Insufficient documentation

## 2017-01-27 HISTORY — PX: DIAGNOSTIC LAPAROSCOPY WITH REMOVAL OF ECTOPIC PREGNANCY: SHX6449

## 2017-01-27 LAB — CBC
HCT: 38 % (ref 36.0–46.0)
Hemoglobin: 12.4 g/dL (ref 12.0–15.0)
MCH: 27.2 pg (ref 26.0–34.0)
MCHC: 32.6 g/dL (ref 30.0–36.0)
MCV: 83.3 fL (ref 78.0–100.0)
PLATELETS: 263 10*3/uL (ref 150–400)
RBC: 4.56 MIL/uL (ref 3.87–5.11)
RDW: 14.8 % (ref 11.5–15.5)
WBC: 8 10*3/uL (ref 4.0–10.5)

## 2017-01-27 LAB — URINALYSIS, ROUTINE W REFLEX MICROSCOPIC
Bilirubin Urine: NEGATIVE
GLUCOSE, UA: NEGATIVE mg/dL
KETONES UR: NEGATIVE mg/dL
LEUKOCYTES UA: NEGATIVE
Nitrite: NEGATIVE
PROTEIN: NEGATIVE mg/dL
Specific Gravity, Urine: 1.005 (ref 1.005–1.030)
pH: 7 (ref 5.0–8.0)

## 2017-01-27 LAB — WET PREP, GENITAL
Clue Cells Wet Prep HPF POC: NONE SEEN
SPERM: NONE SEEN
Trich, Wet Prep: NONE SEEN
Yeast Wet Prep HPF POC: NONE SEEN

## 2017-01-27 LAB — HCG, QUANTITATIVE, PREGNANCY: HCG, BETA CHAIN, QUANT, S: 5462 m[IU]/mL — AB (ref <5)

## 2017-01-27 LAB — POCT PREGNANCY, URINE: PREG TEST UR: POSITIVE — AB

## 2017-01-27 SURGERY — LAPAROSCOPY, WITH ECTOPIC PREGNANCY SURGICAL TREATMENT
Anesthesia: General | Site: Abdomen

## 2017-01-27 MED ORDER — PROMETHAZINE HCL 25 MG/ML IJ SOLN
6.2500 mg | INTRAMUSCULAR | Status: DC | PRN
  Administered 2017-01-27: 6.25 mg via INTRAVENOUS

## 2017-01-27 MED ORDER — BUPIVACAINE HCL 0.5 % IJ SOLN
INTRAMUSCULAR | Status: DC | PRN
  Administered 2017-01-27: 10 mL

## 2017-01-27 MED ORDER — SOD CITRATE-CITRIC ACID 500-334 MG/5ML PO SOLN
ORAL | Status: AC
  Administered 2017-01-27: 30 mL via ORAL
  Filled 2017-01-27: qty 15

## 2017-01-27 MED ORDER — PROPOFOL 10 MG/ML IV BOLUS
INTRAVENOUS | Status: AC
  Filled 2017-01-27: qty 20

## 2017-01-27 MED ORDER — ROCURONIUM BROMIDE 100 MG/10ML IV SOLN
INTRAVENOUS | Status: DC | PRN
  Administered 2017-01-27: 20 mg via INTRAVENOUS

## 2017-01-27 MED ORDER — ONDANSETRON HCL 4 MG/2ML IJ SOLN
INTRAMUSCULAR | Status: AC
  Filled 2017-01-27: qty 2

## 2017-01-27 MED ORDER — SUGAMMADEX SODIUM 200 MG/2ML IV SOLN
INTRAVENOUS | Status: AC
  Filled 2017-01-27: qty 2

## 2017-01-27 MED ORDER — LACTATED RINGERS IV SOLN
INTRAVENOUS | Status: DC | PRN
  Administered 2017-01-27 (×2): via INTRAVENOUS

## 2017-01-27 MED ORDER — SOD CITRATE-CITRIC ACID 500-334 MG/5ML PO SOLN
30.0000 mL | Freq: Once | ORAL | Status: DC
  Administered 2017-01-27: 30 mL via ORAL

## 2017-01-27 MED ORDER — KETOROLAC TROMETHAMINE 30 MG/ML IJ SOLN
INTRAMUSCULAR | Status: DC | PRN
  Administered 2017-01-27: 30 mg via INTRAVENOUS

## 2017-01-27 MED ORDER — DEXTROSE IN LACTATED RINGERS 5 % IV SOLN: INTRAVENOUS | Status: DC

## 2017-01-27 MED ORDER — ONDANSETRON HCL 4 MG/2ML IJ SOLN
INTRAMUSCULAR | Status: DC | PRN
Start: 2017-01-27 — End: 2017-01-27
  Administered 2017-01-27: 4 mg via INTRAVENOUS

## 2017-01-27 MED ORDER — MIDAZOLAM HCL 2 MG/2ML IJ SOLN
INTRAMUSCULAR | Status: AC
  Filled 2017-01-27: qty 2

## 2017-01-27 MED ORDER — MIDAZOLAM HCL 2 MG/2ML IJ SOLN
INTRAMUSCULAR | Status: DC | PRN
  Administered 2017-01-27: 2 mg via INTRAVENOUS

## 2017-01-27 MED ORDER — FENTANYL CITRATE (PF) 100 MCG/2ML IJ SOLN
INTRAMUSCULAR | Status: AC
  Filled 2017-01-27: qty 2

## 2017-01-27 MED ORDER — ROCURONIUM BROMIDE 100 MG/10ML IV SOLN
INTRAVENOUS | Status: AC
  Filled 2017-01-27: qty 1

## 2017-01-27 MED ORDER — PROPOFOL 10 MG/ML IV BOLUS
INTRAVENOUS | Status: DC | PRN
  Administered 2017-01-27: 200 mg via INTRAVENOUS

## 2017-01-27 MED ORDER — FAMOTIDINE IN NACL 20-0.9 MG/50ML-% IV SOLN
20.0000 mg | Freq: Once | INTRAVENOUS | Status: AC
  Administered 2017-01-27: 20 mg via INTRAVENOUS
  Filled 2017-01-27: qty 50

## 2017-01-27 MED ORDER — HYDROCODONE-ACETAMINOPHEN 5-325 MG PO TABS
1.0000 | ORAL_TABLET | Freq: Four times a day (QID) | ORAL | 0 refills | Status: DC | PRN
Start: 2017-01-27 — End: 2018-02-17

## 2017-01-27 MED ORDER — DEXAMETHASONE SODIUM PHOSPHATE 10 MG/ML IJ SOLN
INTRAMUSCULAR | Status: DC | PRN
  Administered 2017-01-27: 10 mg via INTRAVENOUS

## 2017-01-27 MED ORDER — SCOPOLAMINE 1 MG/3DAYS TD PT72
1.0000 | MEDICATED_PATCH | TRANSDERMAL | Status: DC
  Administered 2017-01-27: 1.5 mg via TRANSDERMAL
  Filled 2017-01-27: qty 1

## 2017-01-27 MED ORDER — KETOROLAC TROMETHAMINE 30 MG/ML IJ SOLN
INTRAMUSCULAR | Status: AC
  Filled 2017-01-27: qty 1

## 2017-01-27 MED ORDER — IBUPROFEN 800 MG PO TABS: 800.0000 mg | ORAL_TABLET | Freq: Three times a day (TID) | ORAL | 0 refills | Status: DC | PRN

## 2017-01-27 MED ORDER — PROMETHAZINE HCL 25 MG/ML IJ SOLN
INTRAMUSCULAR | Status: AC
  Administered 2017-01-27: 6.25 mg via INTRAVENOUS
  Filled 2017-01-27: qty 1

## 2017-01-27 MED ORDER — HYDROMORPHONE HCL 1 MG/ML IJ SOLN: 0.2500 mg | INTRAMUSCULAR | Status: DC | PRN

## 2017-01-27 MED ORDER — LIDOCAINE HCL (CARDIAC) 20 MG/ML IV SOLN
INTRAVENOUS | Status: DC | PRN
  Administered 2017-01-27: 30 mg via INTRAVENOUS

## 2017-01-27 MED ORDER — LIDOCAINE HCL (CARDIAC) 20 MG/ML IV SOLN
INTRAVENOUS | Status: AC
Start: 2017-01-27 — End: 2017-01-27
  Filled 2017-01-27: qty 5

## 2017-01-27 MED ORDER — LIDOCAINE HCL (PF) 1 % IJ SOLN
INTRAMUSCULAR | Status: AC
  Filled 2017-01-27: qty 5

## 2017-01-27 MED ORDER — FENTANYL CITRATE (PF) 250 MCG/5ML IJ SOLN
INTRAMUSCULAR | Status: AC
  Filled 2017-01-27: qty 5

## 2017-01-27 MED ORDER — DEXAMETHASONE SODIUM PHOSPHATE 10 MG/ML IJ SOLN
INTRAMUSCULAR | Status: AC
  Filled 2017-01-27: qty 1

## 2017-01-27 MED ORDER — SUCCINYLCHOLINE CHLORIDE 200 MG/10ML IV SOSY
PREFILLED_SYRINGE | INTRAVENOUS | Status: AC
  Filled 2017-01-27: qty 10

## 2017-01-27 MED ORDER — SUGAMMADEX SODIUM 200 MG/2ML IV SOLN
INTRAVENOUS | Status: DC | PRN
  Administered 2017-01-27: 200 mg via INTRAVENOUS

## 2017-01-27 MED ORDER — FENTANYL CITRATE (PF) 100 MCG/2ML IJ SOLN
INTRAMUSCULAR | Status: DC | PRN
  Administered 2017-01-27: 50 ug via INTRAVENOUS
  Administered 2017-01-27: 100 ug via INTRAVENOUS
  Administered 2017-01-27: 50 ug via INTRAVENOUS
  Administered 2017-01-27: 150 ug via INTRAVENOUS
  Administered 2017-01-27: 100 ug via INTRAVENOUS

## 2017-01-27 MED ORDER — SUCCINYLCHOLINE CHLORIDE 20 MG/ML IJ SOLN
INTRAMUSCULAR | Status: DC | PRN
  Administered 2017-01-27: 120 mg via INTRAVENOUS

## 2017-01-27 MED ORDER — ENOXAPARIN SODIUM 40 MG/0.4ML ~~LOC~~ SOLN: 40.0000 mg | SUBCUTANEOUS | 0 refills | Status: DC

## 2017-01-27 SURGICAL SUPPLY — 42 items
ADH SKN CLS APL DERMABOND .7 (GAUZE/BANDAGES/DRESSINGS) ×1
APL SRG 38 LTWT LNG FL B (MISCELLANEOUS)
APPLICATOR ARISTA FLEXITIP XL (MISCELLANEOUS) IMPLANT
BAG SPEC RTRVL LRG 6X4 10 (ENDOMECHANICALS) ×1
BLADE SURG 15 STRL LF C SS BP (BLADE) ×1 IMPLANT
BLADE SURG 15 STRL SS (BLADE) ×4
CLOTH BEACON ORANGE TIMEOUT ST (SAFETY) ×2 IMPLANT
DERMABOND ADVANCED (GAUZE/BANDAGES/DRESSINGS) ×1
DERMABOND ADVANCED .7 DNX12 (GAUZE/BANDAGES/DRESSINGS) ×1 IMPLANT
DRSG OPSITE POSTOP 3X4 (GAUZE/BANDAGES/DRESSINGS) ×1 IMPLANT
DURAPREP 26ML APPLICATOR (WOUND CARE) ×2 IMPLANT
GLOVE BIO SURGEON STRL SZ7.5 (GLOVE) ×4 IMPLANT
GLOVE BIOGEL PI IND STRL 7.0 (GLOVE) ×2 IMPLANT
GLOVE BIOGEL PI INDICATOR 7.0 (GLOVE) ×2
GOWN STRL REUS W/TWL LRG LVL3 (GOWN DISPOSABLE) ×2 IMPLANT
GOWN STRL REUS W/TWL XL LVL3 (GOWN DISPOSABLE) ×2 IMPLANT
HEMOSTAT ARISTA ABSORB 3G PWDR (MISCELLANEOUS) IMPLANT
NDL INSUFF ACCESS 14 VERSASTEP (NEEDLE) ×2 IMPLANT
PACK LAPAROSCOPY BASIN (CUSTOM PROCEDURE TRAY) ×2 IMPLANT
PACK TRENDGUARD 450 HYBRID PRO (MISCELLANEOUS) IMPLANT
PACK TRENDGUARD 600 HYBRD PROC (MISCELLANEOUS) IMPLANT
POUCH SPECIMEN RETRIEVAL 10MM (ENDOMECHANICALS) ×1 IMPLANT
PROTECTOR NERVE ULNAR (MISCELLANEOUS) ×4 IMPLANT
SCALPEL HARMONIC ACE (MISCELLANEOUS) ×1 IMPLANT
SET IRRIG TUBING LAPAROSCOPIC (IRRIGATION / IRRIGATOR) ×1 IMPLANT
SHEARS HARMONIC ACE PLUS 36CM (ENDOMECHANICALS) ×1 IMPLANT
SUT MNCRL AB 4-0 PS2 18 (SUTURE) ×4 IMPLANT
SUT VIC AB 2-0 UR6 27 (SUTURE) ×1 IMPLANT
SUT VIC AB 3-0 X1 27 (SUTURE) ×1 IMPLANT
SUT VICRYL 0 UR6 27IN ABS (SUTURE) ×2 IMPLANT
SUT VICRYL 4-0 PS2 18IN ABS (SUTURE) ×1 IMPLANT
TOWEL OR 17X24 6PK STRL BLUE (TOWEL DISPOSABLE) ×4 IMPLANT
TRAY FOLEY CATH SILVER 14FR (SET/KITS/TRAYS/PACK) ×2 IMPLANT
TRENDGUARD 450 HYBRID PRO PACK (MISCELLANEOUS) ×2
TRENDGUARD 600 HYBRID PROC PK (MISCELLANEOUS)
TROCAR BALLN 12MMX100 BLUNT (TROCAR) ×2 IMPLANT
TROCAR VERSASTEP PLUS 12MM (TROCAR) ×3 IMPLANT
TROCAR VERSASTEP PLUS 5MM (TROCAR) ×1 IMPLANT
TROCAR XCEL NON-BLD 5MMX100MML (ENDOMECHANICALS) ×1 IMPLANT
TROCAR XCEL OPT SLVE 5M 100M (ENDOMECHANICALS) ×1 IMPLANT
WARMER LAPAROSCOPE (MISCELLANEOUS) ×2 IMPLANT
WATER STERILE IRR 1000ML POUR (IV SOLUTION) ×2 IMPLANT

## 2017-01-27 NOTE — MAU Note (Signed)
Pt stated she has been havign some cramping on and off for about a week. Went to [place] and pantyliner was full of blood. Called her OBGY (in [facility]) they were unable to see her today and directed her to come to [facility] hospital

## 2017-01-27 NOTE — OR Nursing (Signed)
SPECIMEN TIMEOUT COMPLETED WITH SURGEON AND SURGICAL STAFF.

## 2017-01-27 NOTE — Op Note (Signed)
Jaylan N Aufiero PROCEDURE DATE: 01/27/2017  PREOPERATIVE DIAGNOSIS:  Ectopic pregnancy POSTOPERATIVE DIAGNOSIS:  right fallopian tube ectopic pregnancy PROCEDURE: Laparoscopic right salpingectomy and removal of ectopic pregnancy SURGEON:  Dr. Arlina Robes ANESTHESIOLOGIST: Duane Boston, MD Anesthesiologist: Duane Boston, MD CRNA: Alvy Bimler, CRNA  INDICATIONS: 41 y.o. [id number] at 6 4/7 weeks  here with the preoperative diagnoses as listed above.  Please refer to preoperative notes for more details. Patient was counseled regarding need for laparoscopic salpingectomy. Risks of surgery including bleeding which may require transfusion or reoperation, infection, injury to bowel or other surrounding organs, need for additional procedures including laparotomy and other postoperative/anesthesia complications were explained to patient.  Written informed consent was obtained.  FINDINGS:   Dilated right fallopian tube containing ectopic gestation. Small normal appearing uterus, normal left fallopian tube, right ovary and left ovary.  ANESTHESIA: General INTRAVENOUS FLUIDS: As recorded  ESTIMATED BLOOD LOSS: < 25 cc ml URINE OUTPUT: As recorded SPECIMENS: Right fallopian tube containing ectopic gestation COMPLICATIONS: None immediate  PROCEDURE IN DETAIL:  The patient was taken to the operating room where general anesthesia was administered and was found to be adequate.  She was placed in the dorsal lithotomy position, and was prepped and draped in a sterile manner.  A Foley catheter was inserted into her bladder and attached to constant drainage and a uterine manipulator was then advanced into the uterus .    After an adequate timeout was performed, attention was turned to the abdomen where an umbilical incision was made with the scalpel. Fascia was exposed with S retractors and grasped with Kocher clamps. Fascia was cut with Mayo . The connors of the Fascia were then secured with 0 Vicryl.  Peritoneum was then entered.  A 12 -mm Excel Hessian trochar with sleeve was then placed without difficulty.   The abdomen was then insufflated with carbon dioxide gas and adequate pneumoperitoneum was obtained.  A survey of the patient's pelvis and abdomen revealed the findings above. A 12 mm one step trochar was then placed in the left lower quadrant. A 5 mm trochar was placed as well.  Attention was  turned to the right fallopian tube which was grasped and ligated from the underlying mesosalpinx and uterine attachment using the Harmonic instrument.  Good hemostasis was noted.  The specimen was placed in an EndoCatch bag and removed from the abdomen intact.  The abdomen was irrigated and blood clots removed. Inspection of all ligated sites revealed good hemostasis. The abdomen was desufflated, and all instruments were removed.  The fascial incision was closed with the  0 Vicryl an interrupted suture of 3-0 Vicryl was used to closed the subcutaneous space.The umbilical incision was closed with 4-0 Vicryl and the lateral 5 mm incision were secured with Dermabond. The 12 mm port site was closed with 4-0 Vicryl.The uterine manipulator and foley cathter was removed.  The patient tolerated the procedure well.  All instruments, needles, and sponge counts were correct x 2. The patient was taken to the recovery room in stable condition.   The patient will be discharged to home as per PACU criteria.  Routine postoperative instructions given.  She was prescribed Percocet, Ibuprofen and Colace.  She will follow up in the clinic in about 2-3 weeks for postoperative evaluation.  Arlina Robes, MD, [place] Attending [place], [facility]

## 2017-01-27 NOTE — Anesthesia Procedure Notes (Signed)
Procedure Name: Intubation Date/Time: 01/27/2017 8:30 PM Performed by: Gilmer Mor R Pre-anesthesia Checklist: Patient identified, Patient being monitored, Timeout performed, Emergency Drugs available and Suction available Patient Re-evaluated:Patient Re-evaluated prior to inductionOxygen Delivery Method: Circle System Utilized Preoxygenation: Pre-oxygenation with 100% oxygen Intubation Type: IV induction, Cricoid Pressure applied and Rapid sequence Laryngoscope Size: Mac and 3 Grade View: Grade II Tube type: Oral Tube size: 7.0 mm Number of attempts: 1 Airway Equipment and Method: stylet Placement Confirmation: ETT inserted through vocal cords under direct vision,  positive ETCO2 and breath sounds checked- equal and bilateral Secured at: 21 cm Tube secured with: Tape Dental Injury: Teeth and Oropharynx as per pre-operative assessment

## 2017-01-27 NOTE — MAU Provider Note (Signed)
History     CSN: [id number]  Arrival date and time: 01/27/17 1401   First Provider Initiated Contact with Patient 01/27/17 1459      Chief Complaint  Patient presents with  . Vaginal Bleeding   [id number] @[redacted]w[redacted]d  by LMP here with spotting and cramping. Reports cramping started 1 week ago. Spotting started this afternoon. Describes as bright red and soaked 3/4 of panty liner. No recent IC. No unusual vaginal discharge.    OB History    Gravida Para Term Preterm AB Living   8 4 4   4 4    SAB TAB Ectopic Multiple Live Births     2 2   4       Past Medical History:  Diagnosis Date  . Anxiety   . Depression   . PE (pulmonary embolism) 03/26/2013   RT LOWER LOBE  . Strep pharyngitis     Past Surgical History:  Procedure Laterality Date  . WISDOM TOOTH EXTRACTION Bilateral     Family History  Problem Relation Age of Onset  . Breast cancer Paternal Grandmother   . Diabetes Paternal Grandfather   . Prostate cancer Paternal Grandfather   . Hypertension Father   . Heart disease Maternal Grandfather   . Healthy Sister     x 1  . Diabetes Sister 47    type II  . Hypertension Sister   . Heart attack Mother 42    deceased- also had lung disorder (? pulmonary fibrosis)    Social History  Substance Use Topics  . Smoking status: Never Smoker  . Smokeless tobacco: Never Used     Comment: never used tobacco  . Alcohol use No     Comment: occasional    Allergies: No Known Allergies  Prescriptions Prior to Admission  Medication Sig Dispense Refill Last Dose  . Aspirin-Salicylamide-Caffeine (BC FAST PAIN RELIEF) 650-195-33.3 MG PACK Take 1 packet by mouth daily as needed (headache/pain).    Not Taking  . clonazePAM (KLONOPIN) 1 MG tablet Take 1 mg by mouth 2 (two) times daily.   Not Taking  . ibuprofen (ADVIL,MOTRIN) 800 MG tablet Take 1 tablet (800 mg total) by mouth every 8 (eight) hours as needed for cramping. (Patient not taking: Reported on 12/17/2016) 30 tablet 0 Not  Taking  . Multiple Vitamins-Minerals (MULTIVITAMIN WITH MINERALS) tablet Take 1 tablet by mouth daily.   Taking  . omeprazole (PRILOSEC) 40 MG capsule Take 1 capsule (40 mg total) by mouth daily. (Patient not taking: Reported on 12/17/2016) 30 capsule 5 Not Taking  . sertraline (ZOLOFT) 100 MG tablet Take by mouth.   Not Taking    Review of Systems  Constitutional: Negative for fever.  Gastrointestinal: Positive for abdominal pain.  Genitourinary: Positive for vaginal bleeding. Negative for dysuria.   Physical Exam   Blood pressure 135/77, pulse 73, temperature 98.7 F (37.1 C), temperature source Oral, resp. rate 18, height 5\' 6"  (1.676 m), weight 97.1 kg (214 lb 0.6 oz), last menstrual period 12/12/2016.  Physical Exam  Constitutional: She is oriented to person, place, and time. She appears well-developed and well-nourished. No distress.  HENT:  Head: Normocephalic and atraumatic.  Neck: Normal range of motion.  Cardiovascular: Normal rate.   Respiratory: Effort normal.  GI: Soft. She exhibits no distension and no mass. There is no tenderness. There is no rebound and no guarding.  Genitourinary:  Genitourinary Comments: External: no lesions or erythema Vagina: rugated, parous, scant brown discharge Uterus: non enlarged, anteverted, non tender, no  CMT Adnexae: no masses, no tenderness left, no tenderness right   Musculoskeletal: Normal range of motion.  Neurological: She is alert and oriented to person, place, and time.  Skin: Skin is warm and dry.  Psychiatric: She has a normal mood and affect.   Results for orders placed or performed during the hospital encounter of 01/27/17 (from the past 24 hour(s))  Urinalysis, Routine w reflex microscopic     Status: Abnormal   Collection Time: 01/27/17  2:15 PM  Result Value Ref Range   Color, Urine STRAW (A) YELLOW   APPearance CLEAR CLEAR   Specific Gravity, Urine 1.005 1.005 - 1.030   pH 7.0 5.0 - 8.0   Glucose, UA NEGATIVE  NEGATIVE mg/dL   Hgb urine dipstick MODERATE (A) NEGATIVE   Bilirubin Urine NEGATIVE NEGATIVE   Ketones, ur NEGATIVE NEGATIVE mg/dL   Protein, ur NEGATIVE NEGATIVE mg/dL   Nitrite NEGATIVE NEGATIVE   Leukocytes, UA NEGATIVE NEGATIVE   RBC / HPF 0-5 0 - 5 RBC/hpf   WBC, UA 0-5 0 - 5 WBC/hpf   Bacteria, UA RARE (A) NONE SEEN   Squamous Epithelial / LPF 0-5 (A) NONE SEEN  Pregnancy, urine POC     Status: Abnormal   Collection Time: 01/27/17  2:23 PM  Result Value Ref Range   Preg Test, Ur POSITIVE (A) NEGATIVE  hCG, quantitative, pregnancy     Status: Abnormal   Collection Time: 01/27/17  2:26 PM  Result Value Ref Range   hCG, Beta Chain, Quant, S 5,462 (H) <5 mIU/mL  CBC     Status: None   Collection Time: 01/27/17  2:26 PM  Result Value Ref Range   WBC 8.0 4.0 - 10.5 K/uL   RBC 4.56 3.87 - 5.11 MIL/uL   Hemoglobin 12.4 12.0 - 15.0 g/dL   HCT 38.0 36.0 - 46.0 %   MCV 83.3 78.0 - 100.0 fL   MCH 27.2 26.0 - 34.0 pg   MCHC 32.6 30.0 - 36.0 g/dL   RDW 14.8 11.5 - 15.5 %   Platelets 263 150 - 400 K/uL  Wet prep, genital     Status: Abnormal   Collection Time: 01/27/17  3:02 PM  Result Value Ref Range   Yeast Wet Prep HPF POC NONE SEEN NONE SEEN   Trich, Wet Prep NONE SEEN NONE SEEN   Clue Cells Wet Prep HPF POC NONE SEEN NONE SEEN   WBC, Wet Prep HPF POC FEW (A) NONE SEEN   Sperm NONE SEEN    US Ob Comp Less 14 Wks  Result Date: 01/27/2017 CLINICAL DATA:  Vaginal bleeding and cramping for 1 day. Gestational age by LMP of 6 weeks 4 days. EXAM: OBSTETRIC <14 WK [place] AND TRANSVAGINAL OB US TECHNIQUE: Both transabdominal and transvaginal ultrasound examinations were performed for complete evaluation of the gestation as well as the maternal uterus, adnexal regions, and pelvic cul-de-sac. Transvaginal technique was performed to assess early pregnancy. COMPARISON:  None. FINDINGS: Intrauterine gestational sac: None Uterus is retroflexed.  No fibroids identified. Left ovary is normal  appearance. The right ovary contains a small corpus luteum. In the right adnexa separate from the ovary, there is a small adnexal mass with central cystic area which measures 2.4 x 1.8 by 1.8 cm. This is highly suspicious for ectopic pregnancy. A tiny amount of simple free fluid is seen in the pelvic cul-de-sac. IMPRESSION: No IUP visualized. 2.4 cm right adnexal mass, highly suspicious for ectopic pregnancy. Tiny amount of simple free fluid  in pelvic cul-de-sac. Critical Value/emergent results were called by telephone at the time of interpretation on 01/27/2017 at 4:23 pm to provider [facility] , who verbally acknowledged these results. Electronically Signed   By: Earle Gell M.D.   On: 01/27/2017 16:25   US Ob Transvaginal  Result Date: 01/27/2017 CLINICAL DATA:  Vaginal bleeding and cramping for 1 day. Gestational age by LMP of 6 weeks 4 days. EXAM: OBSTETRIC <14 WK [place] AND TRANSVAGINAL OB US TECHNIQUE: Both transabdominal and transvaginal ultrasound examinations were performed for complete evaluation of the gestation as well as the maternal uterus, adnexal regions, and pelvic cul-de-sac. Transvaginal technique was performed to assess early pregnancy. COMPARISON:  None. FINDINGS: Intrauterine gestational sac: None Uterus is retroflexed.  No fibroids identified. Left ovary is normal appearance. The right ovary contains a small corpus luteum. In the right adnexa separate from the ovary, there is a small adnexal mass with central cystic area which measures 2.4 x 1.8 by 1.8 cm. This is highly suspicious for ectopic pregnancy. A tiny amount of simple free fluid is seen in the pelvic cul-de-sac. IMPRESSION: No IUP visualized. 2.4 cm right adnexal mass, highly suspicious for ectopic pregnancy. Tiny amount of simple free fluid in pelvic cul-de-sac. Critical Value/emergent results were called by telephone at the time of interpretation on 01/27/2017 at 4:23 pm to provider [facility] , who verbally acknowledged  these results. Electronically Signed   By: Earle Gell M.D.   On: 01/27/2017 16:25   MAU Course  Procedures  MDM Labs and [place] ordered and reviewed. Ectopic pregnancy identified. Consult with Dr. Rip Harbour regarding findings. MD to come evaluate and discuss mngt with patient. 1905-Dr. Ervin at bedside   Assessment and Plan  Ectopic pregnancy Admit for OR Mngt per Dr. Jeannie Fend, CNM 01/27/2017, 3:11 PM

## 2017-01-27 NOTE — Transfer of Care (Signed)
Immediate Anesthesia Transfer of Care Note  Patient: Dana Carpenter  Procedure(s) Performed: Procedure(s): DIAGNOSTIC LAPAROSCOPY WITH RIGHT SALPINGECTOMY FOR RIGHT ECTOPIC PREGNANCY (N/A)  Patient Location: PACU  Anesthesia Type:General  Level of Consciousness: awake, alert , oriented and patient cooperative  Airway & Oxygen Therapy: Patient Spontanous Breathing and Patient connected to nasal cannula oxygen  Post-op Assessment: Report given to RN, Post -op Vital signs reviewed and stable and Patient moving all extremities X 4  Post vital signs: Reviewed and stable  Last Vitals:  Vitals:   01/27/17 1410  BP: 135/77  Pulse: 73  Resp: 18  Temp: 37.1 C    Last Pain:  Vitals:   01/27/17 1615  TempSrc:   PainSc: 3       Patients Stated Pain Goal: 0 ([id number])  Complications: No apparent anesthesia complications

## 2017-01-27 NOTE — Anesthesia Preprocedure Evaluation (Addendum)
Anesthesia Evaluation  Patient identified by MRN, date of birth, ID band Patient awake    Reviewed: Allergy & Precautions, NPO status , Patient's Chart, lab work & pertinent test results  Airway Mallampati: II  TM Distance: >3 FB Neck ROM: Full    Dental  (+) Teeth Intact, Dental Advisory Given   Pulmonary neg pulmonary ROS,    Pulmonary exam normal        Cardiovascular negative cardio ROS Normal cardiovascular exam     Neuro/Psych PSYCHIATRIC DISORDERS Anxiety Depression negative neurological ROS  negative psych ROS   GI/Hepatic negative GI ROS, Neg liver ROS,   Endo/Other  Morbid obesity  Renal/GU negative Renal ROS  negative genitourinary   Musculoskeletal negative musculoskeletal ROS (+)   Abdominal   Peds negative pediatric ROS (+)  Hematology negative hematology ROS (+)   Anesthesia Other Findings   Reproductive/Obstetrics                            Anesthesia Physical Anesthesia Plan  ASA: II and emergent  Anesthesia Plan: General   Post-op Pain Management:    Induction: Intravenous, Rapid sequence and Cricoid pressure planned  Airway Management Planned: Oral ETT  Additional Equipment:   Intra-op Plan:   Post-operative Plan: Extubation in OR  Informed Consent: I have reviewed the patients History and Physical, chart, labs and discussed the procedure including the risks, benefits and alternatives for the proposed anesthesia with the patient or authorized representative who has indicated his/her understanding and acceptance.   Dental advisory given  Plan Discussed with: CRNA, Anesthesiologist and Surgeon  Anesthesia Plan Comments:         Anesthesia Quick Evaluation

## 2017-01-27 NOTE — Anesthesia Postprocedure Evaluation (Addendum)
Anesthesia Post Note  Patient: Dana Carpenter  Procedure(s) Performed: Procedure(s) (LRB): DIAGNOSTIC LAPAROSCOPY WITH RIGHT SALPINGECTOMY FOR RIGHT ECTOPIC PREGNANCY (N/A)  Patient location during evaluation: PACU Anesthesia Type: General Level of consciousness: sedated Pain management: pain level controlled Vital Signs Assessment: post-procedure vital signs reviewed and stable Respiratory status: spontaneous breathing and respiratory function stable Cardiovascular status: stable Anesthetic complications: no        Last Vitals:  Vitals:   01/27/17 2200 01/27/17 2215  BP: 120/64 113/66  Pulse: 81 69  Resp: 18 14  Temp:      Last Pain:  Vitals:   01/27/17 2215  TempSrc:   PainSc: Asleep   Pain Goal: Patients Stated Pain Goal: 0 (01/27/17 1615)               Duane Boston DANIEL

## 2017-01-27 NOTE — H&P (Signed)
Dana Carpenter is an 41 y.o. female [id number] 6 4/7 weeks by LMP who is diagnosis with a right ectopic pregnancy today in the [facility]U. She is hemodynamically stable. Management options have been reviewed with pt. She desires to proceed with surgerical management.  Pt has a H/O PE with negative work up back in 2014, treated x 1 yr. No problems since.  She also has a H/O ectopic pregnancy in the past which was treated with MTX.  Pertinent Gynecological History: Menses: regular every month without intermenstrual spotting Bleeding: none Contraception: none DES exposure: denies Blood transfusions: none Sexually transmitted diseases: no past history Previous GYN Procedures: none  Last mammogram: NA Date: NA Last pap: unknown Date: unknown OB History: As noted   Menstrual History: Menarche age: 41 Patient's last menstrual period was 12/12/2016.    Past Medical History:  Diagnosis Date  . Anxiety   . Depression   . PE (pulmonary embolism) 03/26/2013   RT LOWER LOBE  . Strep pharyngitis     Past Surgical History:  Procedure Laterality Date  . WISDOM TOOTH EXTRACTION Bilateral     Family History  Problem Relation Age of Onset  . Breast cancer Paternal Grandmother   . Diabetes Paternal Grandfather   . Prostate cancer Paternal Grandfather   . Hypertension Father   . Heart disease Maternal Grandfather   . Healthy Sister     x 1  . Diabetes Sister 63    type II  . Hypertension Sister   . Heart attack Mother 75    deceased- also had lung disorder (? pulmonary fibrosis)    Social History:  reports that she has never smoked. She has never used smokeless tobacco. She reports that she does not drink alcohol or use drugs.  Allergies: No Known Allergies  Prescriptions Prior to Admission  Medication Sig Dispense Refill Last Dose  . folic acid (FOLVITE) 1 MG tablet Take 1 mg by mouth daily.   01/27/2017 at Unknown time  . Multiple Vitamins-Minerals (MULTIVITAMIN WITH MINERALS) tablet  Take 1 tablet by mouth daily.   01/27/2017 at Unknown time  . ibuprofen (ADVIL,MOTRIN) 800 MG tablet Take 1 tablet (800 mg total) by mouth every 8 (eight) hours as needed for cramping. (Patient not taking: Reported on 12/17/2016) 30 tablet 0 Completed Course at Unknown time  . omeprazole (PRILOSEC) 40 MG capsule Take 1 capsule (40 mg total) by mouth daily. (Patient not taking: Reported on 12/17/2016) 30 capsule 5 Not Taking at Unknown time    Review of Systems  Constitutional: Negative.   Respiratory: Negative.   Cardiovascular: Negative.   Gastrointestinal: Negative.   Genitourinary: Negative.     Blood pressure 135/77, pulse 73, temperature 98.7 F (37.1 C), temperature source Oral, resp. rate 18, height 5\' 6"  (1.676 m), weight 97.1 kg (214 lb 0.6 oz), last menstrual period 12/12/2016. Physical Exam  Constitutional: She appears well-developed and well-nourished.  Cardiovascular: Normal rate and regular rhythm.   Respiratory: Effort normal and breath sounds normal.  GI: Soft. Bowel sounds are normal.  Genitourinary:  Genitourinary Comments: Deferred to OR    Results for orders placed or performed during the hospital encounter of 01/27/17 (from the past 24 hour(s))  Urinalysis, Routine w reflex microscopic     Status: Abnormal   Collection Time: 01/27/17  2:15 PM  Result Value Ref Range   Color, Urine STRAW (A) YELLOW   APPearance CLEAR CLEAR   Specific Gravity, Urine 1.005 1.005 - 1.030   pH 7.0 5.0 -  8.0   Glucose, UA NEGATIVE NEGATIVE mg/dL   Hgb urine dipstick MODERATE (A) NEGATIVE   Bilirubin Urine NEGATIVE NEGATIVE   Ketones, ur NEGATIVE NEGATIVE mg/dL   Protein, ur NEGATIVE NEGATIVE mg/dL   Nitrite NEGATIVE NEGATIVE   Leukocytes, UA NEGATIVE NEGATIVE   RBC / HPF 0-5 0 - 5 RBC/hpf   WBC, UA 0-5 0 - 5 WBC/hpf   Bacteria, UA RARE (A) NONE SEEN   Squamous Epithelial / LPF 0-5 (A) NONE SEEN  Pregnancy, urine POC     Status: Abnormal   Collection Time: 01/27/17  2:23 PM   Result Value Ref Range   Preg Test, Ur POSITIVE (A) NEGATIVE  hCG, quantitative, pregnancy     Status: Abnormal   Collection Time: 01/27/17  2:26 PM  Result Value Ref Range   hCG, Beta Chain, Quant, S 5,462 (H) <5 mIU/mL  CBC     Status: None   Collection Time: 01/27/17  2:26 PM  Result Value Ref Range   WBC 8.0 4.0 - 10.5 K/uL   RBC 4.56 3.87 - 5.11 MIL/uL   Hemoglobin 12.4 12.0 - 15.0 g/dL   HCT 38.0 36.0 - 46.0 %   MCV 83.3 78.0 - 100.0 fL   MCH 27.2 26.0 - 34.0 pg   MCHC 32.6 30.0 - 36.0 g/dL   RDW 14.8 11.5 - 15.5 %   Platelets 263 150 - 400 K/uL  Wet prep, genital     Status: Abnormal   Collection Time: 01/27/17  3:02 PM  Result Value Ref Range   Yeast Wet Prep HPF POC NONE SEEN NONE SEEN   Trich, Wet Prep NONE SEEN NONE SEEN   Clue Cells Wet Prep HPF POC NONE SEEN NONE SEEN   WBC, Wet Prep HPF POC FEW (A) NONE SEEN   Sperm NONE SEEN     US Ob Comp Less 14 Wks  Result Date: 01/27/2017 CLINICAL DATA:  Vaginal bleeding and cramping for 1 day. Gestational age by LMP of 6 weeks 4 days. EXAM: OBSTETRIC <14 WK [place] AND TRANSVAGINAL OB US TECHNIQUE: Both transabdominal and transvaginal ultrasound examinations were performed for complete evaluation of the gestation as well as the maternal uterus, adnexal regions, and pelvic cul-de-sac. Transvaginal technique was performed to assess early pregnancy. COMPARISON:  None. FINDINGS: Intrauterine gestational sac: None Uterus is retroflexed.  No fibroids identified. Left ovary is normal appearance. The right ovary contains a small corpus luteum. In the right adnexa separate from the ovary, there is a small adnexal mass with central cystic area which measures 2.4 x 1.8 by 1.8 cm. This is highly suspicious for ectopic pregnancy. A tiny amount of simple free fluid is seen in the pelvic cul-de-sac. IMPRESSION: No IUP visualized. 2.4 cm right adnexal mass, highly suspicious for ectopic pregnancy. Tiny amount of simple free fluid in pelvic cul-de-sac.  Critical Value/emergent results were called by telephone at the time of interpretation on 01/27/2017 at 4:23 pm to provider [facility] , who verbally acknowledged these results. Electronically Signed   By: Earle Gell M.D.   On: 01/27/2017 16:25   US Ob Transvaginal  Result Date: 01/27/2017 CLINICAL DATA:  Vaginal bleeding and cramping for 1 day. Gestational age by LMP of 6 weeks 4 days. EXAM: OBSTETRIC <14 WK [place] AND TRANSVAGINAL OB US TECHNIQUE: Both transabdominal and transvaginal ultrasound examinations were performed for complete evaluation of the gestation as well as the maternal uterus, adnexal regions, and pelvic cul-de-sac. Transvaginal technique was performed to assess early pregnancy.  COMPARISON:  None. FINDINGS: Intrauterine gestational sac: None Uterus is retroflexed.  No fibroids identified. Left ovary is normal appearance. The right ovary contains a small corpus luteum. In the right adnexa separate from the ovary, there is a small adnexal mass with central cystic area which measures 2.4 x 1.8 by 1.8 cm. This is highly suspicious for ectopic pregnancy. A tiny amount of simple free fluid is seen in the pelvic cul-de-sac. IMPRESSION: No IUP visualized. 2.4 cm right adnexal mass, highly suspicious for ectopic pregnancy. Tiny amount of simple free fluid in pelvic cul-de-sac. Critical Value/emergent results were called by telephone at the time of interpretation on 01/27/2017 at 4:23 pm to provider [facility] , who verbally acknowledged these results. Electronically Signed   By: Earle Gell M.D.   On: 01/27/2017 16:25    Assessment/Plan: Probable right ectopic pregnancy  Will proceed to OR for management. R/B and post op care have been reviewed with  Pt. She verbalized understanding and agrees to proceed.  Will start Lovenox 40 mg SQ in 24 hrs for 4-6 weeks  Chancy Milroy 01/27/2017, 7:43 PM

## 2017-01-27 NOTE — Discharge Instructions (Signed)
Diagnostic Laparoscopy, Care After Introduction Refer to this sheet in the next few weeks. These instructions provide you with information about caring for yourself after your procedure. Your health care provider may also give you more specific instructions. Your treatment has been planned according to current medical practices, but problems sometimes occur. Call your health care provider if you have any problems or questions after your procedure. What can I expect after the procedure? After your procedure, it is common to have mild discomfort in the throat and abdomen. Follow these instructions at home:  Take over-the-counter and prescription medicines only as told by your health care provider.  Do not drive for 24 hours if you received a sedative.  Return to your normal activities as told by your health care provider.  Do not take baths, swim, or use a hot tub until your health care provider approves. You may shower.  Follow instructions from your health care provider about how to take care of your incision. Make sure you:  Wash your hands with soap and water before you change your bandage (dressing). If soap and water are not available, use hand sanitizer.  Change your dressing as told by your health care provider.  Leave stitches (sutures), skin glue, or adhesive strips in place. These skin closures may need to stay in place for 2 weeks or longer. If adhesive strip edges start to loosen and curl up, you may trim the loose edges. Do not remove adhesive strips completely unless your health care provider tells you to do that.  Check your incision area every day for signs of infection. Check for:  More redness, swelling, or pain.  More fluid or blood.  Warmth.  Pus or a bad smell.  It is your responsibility to get the results of your procedure. Ask your health care provider or the department performing the procedure when your results will be ready. Contact a health care provider  if:  There is new pain in your shoulders.  You feel light-headed or faint.  You are unable to pass gas or unable to have a bowel movement.  You feel nauseous or you vomit.  You develop a rash.  You have more redness, swelling, or pain around your incision.  You have more fluid or blood coming from your incision.  Your incision feels warm to the touch.  You have pus or a bad smell coming from your incision.  You have a fever or chills. Get help right away if:  Your pain is getting worse.  You have ongoing vomiting.  The edges of your incision open up.  You have trouble breathing.  You have chest pain. This information is not intended to replace advice given to you by your health care provider. Make sure you discuss any questions you have with your health care provider. Document Released: 11/03/2015 Document Revised: 04/29/2016 Document Reviewed: 08/05/2015  2017 [employer]vier

## 2017-01-28 ENCOUNTER — Encounter (HOSPITAL_COMMUNITY): Payer: Self-pay | Admitting: Obstetrics and Gynecology

## 2017-01-28 DIAGNOSIS — O00101 Right tubal pregnancy without intrauterine pregnancy: Secondary | ICD-10-CM | POA: Diagnosis not present

## 2017-01-28 LAB — GC/CHLAMYDIA PROBE AMP (~~LOC~~) NOT AT ARMC
CHLAMYDIA, DNA PROBE: NEGATIVE
Neisseria Gonorrhea: NEGATIVE

## 2017-01-28 LAB — HIV ANTIBODY (ROUTINE TESTING W REFLEX): HIV SCREEN 4TH GENERATION: NONREACTIVE

## 2017-01-31 ENCOUNTER — Telehealth: Payer: Self-pay

## 2017-01-31 NOTE — Telephone Encounter (Signed)
Patient went to Hospital on Thursday 01-27-18 and she had an ectopic pregnancy. Patient had right salpingectomy. Patient would like note for work and how long she should be written out for. Patient experiencing some up and down emotions she states and some nausea. Patient made aware that we could write her out until her post op (two weeks) or she could go back to work depending on how she is feeling. Patient states that she will think about it and let me know when she calls back with her work fax number. Kathrene Alu RNBSN

## 2017-02-02 ENCOUNTER — Telehealth: Payer: Self-pay

## 2017-02-02 NOTE — Telephone Encounter (Signed)
Patient called and needs a note to return to work on 02-09-17. Note faxed to [employer] at [phone]. Kathrene Alu RN

## 2017-02-08 ENCOUNTER — Other Ambulatory Visit: Payer: Self-pay

## 2017-02-08 NOTE — Progress Notes (Signed)
Patient needs return to work note for 02-09-17. FMLA Complete.Dana Carpenter

## 2017-02-18 ENCOUNTER — Encounter: Payer: Managed Care, Other (non HMO) | Admitting: Family Medicine

## 2017-02-25 ENCOUNTER — Ambulatory Visit: Payer: Managed Care, Other (non HMO) | Admitting: Family Medicine

## 2017-02-25 ENCOUNTER — Encounter: Payer: Self-pay | Admitting: Family Medicine

## 2017-02-25 VITALS — BP 126/72 | HR 65 | Ht 66.0 in | Wt 206.0 lb

## 2017-02-25 DIAGNOSIS — F4321 Adjustment disorder with depressed mood: Secondary | ICD-10-CM

## 2017-02-25 DIAGNOSIS — O00109 Unspecified tubal pregnancy without intrauterine pregnancy: Secondary | ICD-10-CM

## 2017-02-25 NOTE — Progress Notes (Signed)
   Subjective:    Patient ID: Dana Carpenter, female    DOB: 19-Sep-1976, 41 y.o.   MRN: [id number]  HPI Patient is a G8 [id number] who is approximately 4 weeks status post laparoscopic removal of ectopic pregnancy. Patient is doing well with minimal problems postoperatively. She does report some residual tenderness around the incision sites. She is back to full activity. She does have grief due to the pregnancy loss, pHQ9 was 17. She denies suicidality. She is currently in a relationship was been trying for the past 2 years to conceive.   Review of Systems     Objective:   Physical Exam  Constitutional: She is oriented to person, place, and time. She appears well-developed and well-nourished.  Cardiovascular: Normal rate and regular rhythm.   Pulmonary/Chest: Effort normal.  Abdominal: Soft. She exhibits no distension and no mass. There is no tenderness. There is no rebound and no guarding.  Incision clean dry and intact. Was a little bit of suture coming from the incision sites. These were trimmed flush with the the skin.  Neurological: She is alert and oriented to person, place, and time.  Skin: Skin is warm and dry. No rash noted. No erythema.  Psychiatric: She has a normal mood and affect. Her behavior is normal. Judgment and thought content normal.       Assessment & Plan:  1. Tubal pregnancy without intrauterine pregnancy, unspecified laterality From a postoperative standpoint, patient is complication. Discussed that she should weight total of 3 months prior to trying to conceive. Patient will finish course of Lovenox secondary to history of PE.  2. Grief reaction I believe that the patient is undergoing normal grieving process. I did offer counseling, however patient declines at the moment. I discussed that she is always welcome to return here if she feels that things were changing and she were having difficulty time coping with the loss.  Will have patient follow-up in 2 months for  annual exam

## 2017-02-25 NOTE — Progress Notes (Signed)
SCORED 17 on PHQ-9  Depression screening. Kathrene Alu RNBSN

## 2017-05-07 NOTE — Addendum Note (Signed)
Addendum  created 05/07/17 0954 by Duane Boston, MD   Sign clinical note

## 2017-05-09 ENCOUNTER — Other Ambulatory Visit: Payer: Managed Care, Other (non HMO)

## 2017-05-09 ENCOUNTER — Other Ambulatory Visit (INDEPENDENT_AMBULATORY_CARE_PROVIDER_SITE_OTHER): Payer: Managed Care, Other (non HMO)

## 2017-05-09 VITALS — Ht 66.0 in | Wt 205.0 lb

## 2017-05-09 DIAGNOSIS — R300 Dysuria: Secondary | ICD-10-CM | POA: Diagnosis not present

## 2017-05-09 MED ORDER — NITROFURANTOIN MONOHYD MACRO 100 MG PO CAPS: 100.0000 mg | ORAL_CAPSULE | Freq: Two times a day (BID) | ORAL | 0 refills | Status: DC

## 2017-05-09 MED ORDER — PHENAZOPYRIDINE HCL 95 MG PO TABS: 95.0000 mg | ORAL_TABLET | Freq: Three times a day (TID) | ORAL | 0 refills | Status: DC | PRN

## 2017-05-09 NOTE — Progress Notes (Signed)
Patient complaining of urinary frequency and burning for two days. Patient states she took an AZO tablet this morning with little relief. Patient tearful when returning from restroom. LMP:05-06-17  Patient urine dipped and positive for blood and possibly ketones but technically hard to determine since urine is discolored from AZO tablets. Will send for culture. Kathrene Alu RNBSN

## 2017-05-10 ENCOUNTER — Other Ambulatory Visit: Payer: Managed Care, Other (non HMO)

## 2017-05-11 ENCOUNTER — Telehealth: Payer: Self-pay

## 2017-05-11 NOTE — Telephone Encounter (Signed)
Patient called and made aware that urine culture showed infection and she is to continue to take the antibiotic given until she completes the whole dose. Patient states understanding and is feeling better from Monday. Kathrene Alu RN

## 2017-05-12 LAB — CULTURE, URINE COMPREHENSIVE

## 2018-02-17 ENCOUNTER — Other Ambulatory Visit (HOSPITAL_BASED_OUTPATIENT_CLINIC_OR_DEPARTMENT_OTHER): Payer: Managed Care, Other (non HMO)

## 2018-02-17 ENCOUNTER — Ambulatory Visit (INDEPENDENT_AMBULATORY_CARE_PROVIDER_SITE_OTHER): Payer: Managed Care, Other (non HMO) | Admitting: Family

## 2018-02-17 ENCOUNTER — Encounter: Payer: Self-pay | Admitting: Family

## 2018-02-17 ENCOUNTER — Ambulatory Visit (HOSPITAL_BASED_OUTPATIENT_CLINIC_OR_DEPARTMENT_OTHER)
Admission: RE | Admit: 2018-02-17 | Discharge: 2018-02-17 | Disposition: A | Discharge status: Home / Self Care | Payer: Managed Care, Other (non HMO) | Source: Ambulatory Visit | Attending: Family | Admitting: Family

## 2018-02-17 VITALS — BP 119/75 | HR 66 | Temp 98.4°F | Resp 16 | Ht 67.0 in | Wt 209.0 lb

## 2018-02-17 DIAGNOSIS — Z23 Encounter for immunization: Secondary | ICD-10-CM

## 2018-02-17 DIAGNOSIS — M5416 Radiculopathy, lumbar region: Secondary | ICD-10-CM

## 2018-02-17 DIAGNOSIS — Z0001 Encounter for general adult medical examination with abnormal findings: Secondary | ICD-10-CM | POA: Diagnosis not present

## 2018-02-17 DIAGNOSIS — M7989 Other specified soft tissue disorders: Secondary | ICD-10-CM | POA: Diagnosis present

## 2018-02-17 DIAGNOSIS — K219 Gastro-esophageal reflux disease without esophagitis: Secondary | ICD-10-CM

## 2018-02-17 DIAGNOSIS — K5900 Constipation, unspecified: Secondary | ICD-10-CM

## 2018-02-17 DIAGNOSIS — Z0000 Encounter for general adult medical examination without abnormal findings: Secondary | ICD-10-CM

## 2018-02-17 LAB — LIPID PANEL
CHOL/HDL RATIO: 3
CHOLESTEROL: 136 mg/dL (ref 0–200)
HDL: 45.7 mg/dL (ref >39.00)
LDL Cholesterol: 80 mg/dL (ref 0–99)
NonHDL: 90.61
TRIGLYCERIDES: 51 mg/dL (ref 0.0–149.0)
VLDL: 10.2 mg/dL (ref 0.0–40.0)

## 2018-02-17 LAB — URINALYSIS, ROUTINE W REFLEX MICROSCOPIC
Bilirubin Urine: NEGATIVE
HGB URINE DIPSTICK: NEGATIVE
Ketones, ur: NEGATIVE
Leukocytes, UA: NEGATIVE
Nitrite: NEGATIVE
RBC / HPF: NONE SEEN (ref 0–?)
SPECIFIC GRAVITY, URINE: 1.01 (ref 1.000–1.030)
TOTAL PROTEIN, URINE-UPE24: NEGATIVE
URINE GLUCOSE: NEGATIVE
UROBILINOGEN UA: 0.2 (ref 0.0–1.0)
pH: 6.5 (ref 5.0–8.0)

## 2018-02-17 LAB — HEPATIC FUNCTION PANEL
ALT: 13 U/L (ref 0–35)
AST: 16 U/L (ref 0–37)
Albumin: 4.1 g/dL (ref 3.5–5.2)
Alkaline Phosphatase: 50 U/L (ref 39–117)
BILIRUBIN DIRECT: 0.2 mg/dL (ref 0.0–0.3)
TOTAL PROTEIN: 7.2 g/dL (ref 6.0–8.3)
Total Bilirubin: 1.1 mg/dL (ref 0.2–1.2)

## 2018-02-17 LAB — CBC WITH DIFFERENTIAL/PLATELET
Basophils Absolute: 0.1 10*3/uL (ref 0.0–0.1)
Basophils Relative: 1.3 % (ref 0.0–3.0)
EOS ABS: 0.3 10*3/uL (ref 0.0–0.7)
EOS PCT: 4.7 % (ref 0.0–5.0)
HCT: 40.8 % (ref 36.0–46.0)
HEMOGLOBIN: 13.2 g/dL (ref 12.0–15.0)
Lymphocytes Relative: 21.2 % (ref 12.0–46.0)
Lymphs Abs: 1.5 10*3/uL (ref 0.7–4.0)
MCHC: 32.4 g/dL (ref 30.0–36.0)
MCV: 85 fl (ref 78.0–100.0)
MONO ABS: 0.4 10*3/uL (ref 0.1–1.0)
Monocytes Relative: 6.1 % (ref 3.0–12.0)
Neutro Abs: 4.7 10*3/uL (ref 1.4–7.7)
Neutrophils Relative %: 66.7 % (ref 43.0–77.0)
Platelets: 221 10*3/uL (ref 150.0–400.0)
RBC: 4.8 Mil/uL (ref 3.87–5.11)
RDW: 13.9 % (ref 11.5–15.5)
WBC: 7.1 10*3/uL (ref 4.0–10.5)

## 2018-02-17 LAB — BASIC METABOLIC PANEL
BUN: 8 mg/dL (ref 6–23)
CO2: 29 mEq/L (ref 19–32)
CREATININE: 0.83 mg/dL (ref 0.40–1.20)
Calcium: 9.4 mg/dL (ref 8.4–10.5)
Chloride: 105 mEq/L (ref 96–112)
GFR: 97.14 mL/min (ref >60.00)
Glucose, Bld: 86 mg/dL (ref 70–99)
POTASSIUM: 3.6 meq/L (ref 3.5–5.1)
Sodium: 138 mEq/L (ref 135–145)

## 2018-02-17 LAB — TSH: TSH: 1.82 u[IU]/mL (ref 0.35–4.50)

## 2018-02-17 MED ORDER — RANITIDINE HCL 150 MG PO TABS: 150.0000 mg | ORAL_TABLET | Freq: Two times a day (BID) | ORAL | Status: DC

## 2018-02-17 MED ORDER — MELOXICAM 7.5 MG PO TABS: 7.5000 mg | ORAL_TABLET | Freq: Every day | ORAL | 0 refills | Status: DC

## 2018-02-17 MED ORDER — SERTRALINE HCL 50 MG PO TABS: ORAL_TABLET | ORAL | 0 refills | Status: DC

## 2018-02-17 NOTE — Progress Notes (Signed)
Subjective:    Patient ID: Dana Carpenter, female    DOB: 1976/04/24, 42 y.o.   MRN: [id number]  HPI  Patient presents today for complete physical.  Immunizations: tetanus due, flu shot up to date Diet: hit or miss, trying to eat healthier Exercise: not as much as she would like since she started working at home.  Pap Smear: 10/15/15 Mammogram: due Vision: up to date Dental: up to date Wt Readings from Last 3 Encounters:  02/17/18 209 lb (94.8 kg)  05/09/17 205 lb (93 kg)  02/25/17 206 lb (93.4 kg)    Reports tonsil stones, occasional heartburn.  Has bad taste in her mouth.   Has some shooting pain in her right leg.  Has associated soreness. Comes and goes.  Describes an electric type pain.    Anxiety- some work stress, palpitations, insomnia.  Reports that she has some issues that she has not dealt with.  Would see a Social worker.  Time is an issue.  Does not look forward to doing things. Has trouble motivating.  Reports that she feels hopeless at times. Denies SI/HI. Reports that she has been on Wellbutrin in the past as well is risperdone. Denies hx of BPD or auditory/visual hallucinations.   Review of Systems  Constitutional: Negative for unexpected weight change.  HENT: Negative for hearing loss and rhinorrhea.        Intermittent right sided tinnitis  Eyes: Negative for visual disturbance.  Respiratory: Negative for cough.   Cardiovascular:       Reports chronic LLE swelling  Gastrointestinal: Positive for constipation. Negative for diarrhea.  Genitourinary: Negative for dyspareunia, dysuria and frequency.       Wakes up about 3 times a night  Musculoskeletal: Negative for back pain and myalgias.  Skin: Negative for rash.  Neurological: Negative for headaches (rare).  Hematological: Negative for adenopathy.  Psychiatric/Behavioral:       Reports + Anxiety see HPI       Past Medical History:  Diagnosis Date  . Anxiety   . Depression   . PE (pulmonary embolism)  03/26/2013   RT LOWER LOBE  . Strep pharyngitis      Social History   Socioeconomic History  . Marital status: Single    Spouse name: Not on file  . Number of children: Not on file  . Years of education: Not on file  . Highest education level: Not on file  Social Needs  . Financial resource strain: Not on file  . Food insecurity - worry: Not on file  . Food insecurity - inability: Not on file  . Transportation needs - medical: Not on file  . Transportation needs - non-medical: Not on file  Occupational History  . Not on file  Tobacco Use  . Smoking status: Never Smoker  . Smokeless tobacco: Never Used  . Tobacco comment: never used tobacco  Substance and Sexual Activity  . Alcohol use: No    Alcohol/week: 0.0 oz    Comment: occasional  . Drug use: No  . Sexual activity: Yes    Partners: Male    Birth control/protection: None  Other Topics Concern  . Not on file  Social History Narrative   Lives with 2 sons [name] 2008, [name] 2011   Older son born 34- in and out of the home   Boyfriend, but single   Works in [employer]   Enjoys sleeping.   Completed some college   Sister lives locally, most of her  family in [place] and has 2 aunts in [place]    Past Surgical History:  Procedure Laterality Date  . DIAGNOSTIC LAPAROSCOPY WITH REMOVAL OF ECTOPIC PREGNANCY N/A 01/27/2017   Procedure: DIAGNOSTIC LAPAROSCOPY WITH RIGHT SALPINGECTOMY FOR RIGHT ECTOPIC PREGNANCY;  Surgeon: Chancy Milroy, MD;  Location: [facility];  Service: Gynecology;  Laterality: N/A;  . WISDOM TOOTH EXTRACTION Bilateral     Family History  Problem Relation Age of Onset  . Breast cancer Paternal Grandmother   . Diabetes Paternal Grandfather   . Prostate cancer Paternal Grandfather   . Hypertension Father   . Heart disease Maternal Grandfather   . Healthy Sister        x 1  . Diabetes Sister 5       type II  . Hypertension Sister   . Heart attack Mother 45        deceased- also had lung disorder (? pulmonary fibrosis)  . Heart disease Maternal Aunt        heart disease  . Heart disease Cousin     No Known Allergies  Current Outpatient Medications on File Prior to Visit  Medication Sig Dispense Refill  . Multiple Vitamins-Minerals (MULTIVITAMIN WITH MINERALS) tablet Take 1 tablet by mouth daily.    . folic acid (FOLVITE) 1 MG tablet Take 1 mg by mouth daily.     No current facility-administered medications on file prior to visit.     BP 119/75 (BP Location: Right Arm, Patient Position: Sitting, Cuff Size: Large)   Pulse 66   Temp 98.4 F (36.9 C) (Oral)   Resp 16   Ht 5\' 7"  (1.702 m)   Wt 209 lb (94.8 kg)   LMP 01/27/2018   SpO2 100%   BMI 32.73 kg/m    Objective:   Physical Exam Physical Exam  Constitutional: She is oriented to person, place, and time. She appears well-developed and well-nourished. No distress.  HENT:  Head: Normocephalic and atraumatic.  Right Ear: Tympanic membrane and ear canal normal.  Left Ear: Tympanic membrane and ear canal normal.  Mouth/Throat: Oropharynx is clear and moist.  Eyes: Pupils are equal, round, and reactive to light. No scleral icterus.  Neck: Normal range of motion. No thyromegaly present.  Cardiovascular: Normal rate and regular rhythm.   No murmur heard. Pulmonary/Chest: Effort normal and breath sounds normal. No respiratory distress. He has no wheezes. She has no rales. She exhibits no tenderness.  Abdominal: Soft. Bowel sounds are normal. She exhibits no distension and no mass. There is no tenderness. There is no rebound and no guarding.  Musculoskeletal: She exhibits no edema.  Lymphadenopathy:    She has no cervical adenopathy.  Neurological: She is alert and oriented to person, place, and time. She has normal patellar reflexes. She exhibits normal muscle tone. Coordination normal.  Skin: Skin is warm and dry. multiple tattoos Psychiatric: She has a normal mood and affect. Her  behavior is normal. Judgment and thought content normal.  Breasts: Examined lying Right: Without masses, retractions, discharge or axillary adenopathy.  Left: Without masses, retractions, discharge or axillary adenopathy.           Assessment & Plan:          Assessment & Plan:  Preventative care-discussed healthy diet, exercise, and weight loss.  EKG tracing is personally reviewed.  EKG notes NSR.  No acute changes.  Tdap given today.  Declines flu shot.  Lumbar radiculopathy-she is having what seems to be some radicular symptoms in  her right leg.  Symptoms are intermittent.  Advised short course of Aleve.  Chronic left lower extremity swelling-given history of PE I think it is important to rule out possibility of left lower extremity DVT.  An ultrasound will be requested.  Depression/anxiety-she seems quite depressed.  Will initiate Zoloft 50 mg once daily. I instructed pt to start 1/2 tablet once daily for 1 week and then increase to a full tablet once daily on week two as tolerated.  We discussed common side effects such as nausea, drowsiness and weight gain.  Also discussed rare but serious side effect of suicide ideation.  She is instructed to discontinue medication go directly to ED if this occurs.  Pt verbalizes understanding.  Plan follow up in 1 month to evaluate progress.  I also strongly suggested that she arrange an appointment with a counselor.  GERD- notes issues with halitosis as well as bad taste in her mouth.  Has history of GERD.  Has taken a proton pump inhibitor in the past.  Will give trial of Zantac.  Constipation-patient is advised to add a fiber supplement once daily.  Increase fresh fruits and veggies in her diet.  Also advised to drink 64 ounces of water a day.

## 2018-02-17 NOTE — Patient Instructions (Addendum)
Please complete lab work prior to leaving. Complete mammogram and leg ultrasound on the first floor.  Add fiber supplement once daily. Increase fresh fruits/veggies, drink 64 oz of water a day to help with constipation. Please begin zoloft 1/2 tab once daily for 1 week, then increase to a full tab once daily. Contact [facility] to schedule an appointment with a counselor. Phone: [phone] Add aleve 220mg  twice daily for 1 week for leg pain.

## 2018-02-20 ENCOUNTER — Institutional Professional Consult (permissible substitution): Payer: Managed Care, Other (non HMO) | Admitting: Family Medicine

## 2018-03-01 ENCOUNTER — Telehealth: Payer: Self-pay | Admitting: *Deleted

## 2018-03-01 ENCOUNTER — Ambulatory Visit (HOSPITAL_BASED_OUTPATIENT_CLINIC_OR_DEPARTMENT_OTHER)
Admission: RE | Admit: 2018-03-01 | Discharge: 2018-03-01 | Disposition: A | Discharge status: Home / Self Care | Payer: Managed Care, Other (non HMO) | Source: Ambulatory Visit | Attending: Family | Admitting: Family

## 2018-03-01 DIAGNOSIS — R928 Other abnormal and inconclusive findings on diagnostic imaging of breast: Secondary | ICD-10-CM | POA: Diagnosis not present

## 2018-03-01 DIAGNOSIS — Z1231 Encounter for screening mammogram for malignant neoplasm of breast: Secondary | ICD-10-CM | POA: Diagnosis not present

## 2018-03-01 DIAGNOSIS — Z0000 Encounter for general adult medical examination without abnormal findings: Secondary | ICD-10-CM

## 2018-03-01 NOTE — Telephone Encounter (Signed)
Pt is requesting referral to a cardiologist for workup of "hereditary" heart disease that several family members have and it was recommended that she be tested as well. Please advise?

## 2018-03-02 ENCOUNTER — Other Ambulatory Visit: Payer: Self-pay | Admitting: Family

## 2018-03-02 DIAGNOSIS — R928 Other abnormal and inconclusive findings on diagnostic imaging of breast: Secondary | ICD-10-CM

## 2018-03-02 NOTE — Telephone Encounter (Signed)
See mychart.  

## 2018-03-03 ENCOUNTER — Telehealth: Payer: Self-pay | Admitting: *Deleted

## 2018-03-03 NOTE — Telephone Encounter (Signed)
Received Physician Orders from [place]; forwarded to provider/SLS 03/29

## 2018-03-06 ENCOUNTER — Other Ambulatory Visit: Payer: Self-pay | Admitting: Family

## 2018-03-06 ENCOUNTER — Ambulatory Visit
Admission: RE | Admit: 2018-03-06 | Discharge: 2018-03-06 | Disposition: A | Discharge status: Home / Self Care | Payer: Managed Care, Other (non HMO) | Source: Ambulatory Visit | Attending: Family | Admitting: Family

## 2018-03-06 DIAGNOSIS — N6489 Other specified disorders of breast: Secondary | ICD-10-CM

## 2018-03-06 DIAGNOSIS — R928 Other abnormal and inconclusive findings on diagnostic imaging of breast: Secondary | ICD-10-CM

## 2018-03-16 ENCOUNTER — Telehealth: Payer: Self-pay | Admitting: Family

## 2018-03-16 DIAGNOSIS — Z8249 Family history of ischemic heart disease and other diseases of the circulatory system: Secondary | ICD-10-CM

## 2018-03-16 NOTE — Telephone Encounter (Signed)
Please contact pt RE: unread mychart message from 3/27

## 2018-03-16 NOTE — Telephone Encounter (Signed)
Patient reports a maternal family history of Cardiomyopathy. MGF died oft attack, mother died at 14 of heart attack, cousin and aunt had heart transplants also.

## 2018-03-17 ENCOUNTER — Other Ambulatory Visit: Payer: Self-pay | Admitting: Family

## 2018-03-29 ENCOUNTER — Ambulatory Visit: Payer: Managed Care, Other (non HMO) | Admitting: Family

## 2018-03-29 DIAGNOSIS — Z0289 Encounter for other administrative examinations: Secondary | ICD-10-CM

## 2018-04-05 ENCOUNTER — Ambulatory Visit: Payer: Managed Care, Other (non HMO) | Admitting: Cardiology

## 2018-04-15 ENCOUNTER — Other Ambulatory Visit: Payer: Self-pay | Admitting: Family

## 2018-04-17 NOTE — Telephone Encounter (Signed)
2 week supply of Sertraline has been sent to pharmacy as pt is past due for follow up.

## 2018-06-27 ENCOUNTER — Telehealth: Payer: Self-pay

## 2018-06-27 NOTE — Telephone Encounter (Signed)
Pt called the office requesting prescriptions for Metronidazole and Diflucan. Pt states that she is having discharge and vaginal irritation. Pt informed that she would have to come in to be seen because she hasn't been seen in 1 year. Pt is scheduled to come in for a nurse visit on 06/28/18. Understanding was voiced.

## 2018-06-28 ENCOUNTER — Ambulatory Visit (INDEPENDENT_AMBULATORY_CARE_PROVIDER_SITE_OTHER): Payer: Managed Care, Other (non HMO)

## 2018-06-28 ENCOUNTER — Other Ambulatory Visit: Payer: Self-pay

## 2018-06-28 VITALS — BP 112/68 | HR 76 | Temp 97.7°F | Ht 66.0 in | Wt 207.0 lb

## 2018-06-28 DIAGNOSIS — N898 Other specified noninflammatory disorders of vagina: Secondary | ICD-10-CM

## 2018-06-28 DIAGNOSIS — Z113 Encounter for screening for infections with a predominantly sexual mode of transmission: Secondary | ICD-10-CM

## 2018-06-28 NOTE — Progress Notes (Addendum)
SUBJECTIVE:  42 y.o. female complains of white vaginal discharge and odor for 1week(s). Denies abnormal vaginal bleeding or significant pelvic pain or fever. No UTI symptoms. Denies history of known exposure to STD.  Patient's last menstrual period was 06/17/2018 (exact date).  OBJECTIVE:  She appears well, afebrile.  ASSESSMENT:  Vaginal Discharge  Vaginal Odor Patient requests testing for GC/CHL   PLAN:  GC, chlamydia, trichomonas, BVAG, CVAG probe sent to lab. Treatment: To be determined once lab results are received ROV prn if symptoms persist or worsen.  Attestation of Attending Supervision of RN: Evaluation and management procedures were performed by the nurse under my supervision and collaboration.  I have reviewed the nursing note and chart, and I agree with the management and plan.  Carolyn L. Harraway-Smith, M.D., Cherlynn June

## 2018-06-28 NOTE — Patient Instructions (Signed)
   Patient to return for annual exam. Patient will be contacted with results of self-swab.

## 2018-06-29 LAB — CERVICOVAGINAL ANCILLARY ONLY
BACTERIAL VAGINITIS: NEGATIVE
CANDIDA VAGINITIS: POSITIVE — AB
CHLAMYDIA, DNA PROBE: NEGATIVE
NEISSERIA GONORRHEA: NEGATIVE
Trichomonas: NEGATIVE

## 2018-06-29 MED ORDER — FLUCONAZOLE 150 MG PO TABS: 150.0000 mg | ORAL_TABLET | Freq: Once | ORAL | 0 refills | Status: AC

## 2018-06-29 NOTE — Addendum Note (Signed)
Addended by: Lavonia Drafts on: 06/29/2018 07:39 PM   Modules accepted: Orders

## 2018-07-03 ENCOUNTER — Telehealth: Payer: Self-pay

## 2018-07-03 MED ORDER — FLUCONAZOLE 100 MG PO TABS: 100.0000 mg | ORAL_TABLET | Freq: Every day | ORAL | 0 refills | Status: DC

## 2018-07-03 NOTE — Telephone Encounter (Signed)
Patient called and verified by name and dob. Patient made aware that she has yeast infection. Patient pharmacy checked and sent in diflucan for patient. Patient made aware to come back for evaluation if her yeast infection symptoms are not relieved with diflucan. Patient states understanding. Kathrene Alu RN

## 2018-07-06 ENCOUNTER — Ambulatory Visit (INDEPENDENT_AMBULATORY_CARE_PROVIDER_SITE_OTHER): Payer: Managed Care, Other (non HMO) | Admitting: Obstetrics & Gynecology

## 2018-07-06 ENCOUNTER — Telehealth: Payer: Self-pay

## 2018-07-06 ENCOUNTER — Encounter: Payer: Self-pay | Admitting: Obstetrics & Gynecology

## 2018-07-06 VITALS — BP 105/58 | HR 88 | Ht 67.0 in | Wt 208.4 lb

## 2018-07-06 DIAGNOSIS — Z124 Encounter for screening for malignant neoplasm of cervix: Secondary | ICD-10-CM | POA: Diagnosis not present

## 2018-07-06 DIAGNOSIS — Z9079 Acquired absence of other genital organ(s): Secondary | ICD-10-CM | POA: Diagnosis not present

## 2018-07-06 DIAGNOSIS — Z01419 Encounter for gynecological examination (general) (routine) without abnormal findings: Secondary | ICD-10-CM | POA: Diagnosis not present

## 2018-07-06 DIAGNOSIS — Z1151 Encounter for screening for human papillomavirus (HPV): Secondary | ICD-10-CM

## 2018-07-06 NOTE — Telephone Encounter (Signed)
Left message for patient to return call to office for information on scheduling her HSG.  Called radiology at [facility] and they will need patient to call them on the first day of her next period. Call [facility] scheduleding at [phone]. Order already in the system. Kathrene Alu RN   Patient returned call and given information. Kathrene Alu RN

## 2018-07-06 NOTE — Patient Instructions (Signed)
Hysterosalpingography Hysterosalpingography is a procedure to look inside your uterus and fallopian tubes. During this procedure, contrast dye is injected into your uterus through your vagina and cervix to illuminate your uterus while X-ray pictures are taken. This procedure may help your health care provider determine whether you have uterine tumors, adhesions, or structural abnormalities. It is commonly used to help determine why a woman is unable to have children (infertility). The procedure usually lasts about 15-30 minutes. LET [facility] CARE PROVIDER KNOW ABOUT:  Any allergies you have.  All medicines you are taking, including vitamins, herbs, eye drops, creams, and over-the-counter medicines.  Previous problems you or members of your family have had with the use of anesthetics.  Any blood disorders you have.  Previous surgeries you have had.  Medical conditions you have. RISKS AND COMPLICATIONS Generally, this is a safe procedure. However, as with any procedure, problems can occur. Possible problems include:  Infection in the lining of the uterus (endometritis) or fallopian tubes (salpingitis).  Damage or perforation of the uterus or fallopian tubes.  An allergic reaction to the contrast dye used to perform the X-ray.  BEFORE THE PROCEDURE  Schedule the procedure after your period stops, but before your next ovulation. This is usually between day 5 and day 10 of your last period. Day 1 is the first day of your period.  Ask your health care provider about changing or stopping your regular medicines.  You may eat and drink as normal.  Empty your bladder before the procedure begins. PROCEDURE  You may be given a medicine to relax you (sedative) or an over-the-counter pain medicine to lessen any discomfort during the procedure.  You will lie down on an X-ray table with your feet in stirrups.  A device called a speculum will be placed into your vagina. This allows your  health care provider to see inside your vagina to the cervix.  The cervix will be washed with a special soap.  A thin, flexible tube will be passed through the cervix into the uterus.  Contrast dye will be put into this tube.  Several X-rays will be taken as the contrast dye spreads through the uterus and fallopian tubes.  The tube will be taken out after the procedure. What to expect after the procedure  Most of the contrast dye will flow out of your vagina naturally. You may want to wear a sanitary pad.  You may feel mild cramping and notice a little bleeding from your vagina. This should go away in 24 hours.  Ask when your test results will be ready. Make sure you get your test results. This information is not intended to replace advice given to you by your health care provider. Make sure you discuss any questions you have with your health care provider. Document Released: 12/25/2004 Document Revised: 04/29/2016 Document Reviewed: 05/25/2013 Elsevier Interactive Patient Education  2017 [employer].

## 2018-07-06 NOTE — Progress Notes (Signed)
Subjective:     Dana Carpenter is a 42 y.o. female here for a routine exam. [id number] EAB x2 EC x2 last preg 01/2017 Current complaints: Pt reports pain during ovulation.  14 days after her menses begin. This pain is getting worse.   Pt on no contraception. Pt is sexually active.    Gynecologic History Patient's last menstrual period was 06/17/2018 (exact date). Contraception: none Last Pap: ASCUS neg hrHPV.  Last mammogram: n/a.   Obstetric History OB History  Gravida Para Term Preterm AB Living  8 4 4   4 4   SAB TAB Ectopic Multiple Live Births    2 2   4     # Outcome Date GA Lbr Len/2nd Weight Sex Delivery Anes PTL Lv  8 Term 2011 [redacted]w[redacted]d  7 lb (3.175 kg) M Vag-Spont   LIV  7 Term 2008 [redacted]w[redacted]d  7 lb (3.175 kg) M Vag-Spont   LIV  6 Term 1999 [redacted]w[redacted]d  7 lb (3.175 kg) M Vag-Spont   LIV  5 Term 1997 [redacted]w[redacted]d  7 lb (3.175 kg) M Vag-Spont   LIV  4 TAB           3 TAB           2 Ectopic           1 Ectopic            The following portions of the patient's history were reviewed and updated as appropriate: allergies, current medications, past family history, past medical history, past social history, past surgical history and problem list.  Review of Systems Pertinent items are noted in HPI.    Objective:  BP (!) 105/58   Pulse 88   Ht 5\' 7"  (1.702 m)   Wt 208 lb 6.4 oz (94.5 kg)   LMP 06/17/2018 (Exact Date)   BMI 32.64 kg/m   General Appearance:    Alert, cooperative, no distress, appears stated age  Head:    Normocephalic, without obvious abnormality, atraumatic  Eyes:    conjunctiva/corneas clear, EOM's intact, both eyes  Ears:    Normal external ear canals, both ears  Nose:   Nares normal, septum midline, mucosa normal, no drainage    or sinus tenderness  Throat:   Lips, mucosa, and tongue normal; teeth and gums normal  Neck:   Supple, symmetrical, trachea midline, no adenopathy;    thyroid:  no enlargement/tenderness/nodules  Back:     Symmetric, no curvature, ROM normal, no  CVA tenderness  Lungs:     Clear to auscultation bilaterally, respirations unlabored  Chest Wall:    No tenderness or deformity   Heart:    Regular rate and rhythm, S1 and S2 normal, no murmur, rub   or gallop  Breast Exam:    No tenderness, masses, or nipple abnormality  Abdomen:     Soft, non-tender, bowel sounds active all four quadrants,    no masses, no organomegaly  Genitalia:    Normal female without lesion, discharge or tenderness; uterus small and mobile     Extremities:   Extremities normal, atraumatic, no cyanosis or edema  Pulses:   2+ and symmetric all extremities  Skin:   Skin color, texture, turgor normal, no rashes or lesions     Assessment:    Healthy female exam.   h/o PE. Considering having another pregnancy.  I have reviewed with pt that either way her pregnancy would be considered high risk and urged her to get prenatal care ASAP  if she discovers that she is pregnant. She will need to be anticoagulation during her pregnancy. She has been sexually active with no reliable contraception for many years.  Plan:    Follow up in: 1 year.    HSG to check patency of left fallopian tube.  Can discuss results via telephone or pt will decide if she wants to come in to discuss management of contraception vs conception following HSG F/u PAP with hrHPV  Lorilynn Lehr L. Harraway-Smith, M.D., Cherlynn June

## 2018-07-10 LAB — CYTOLOGY - PAP
DIAGNOSIS: NEGATIVE
HPV (WINDOPATH): NOT DETECTED

## 2018-07-12 ENCOUNTER — Ambulatory Visit: Payer: Managed Care, Other (non HMO) | Admitting: Family

## 2018-07-12 DIAGNOSIS — Z0289 Encounter for other administrative examinations: Secondary | ICD-10-CM

## 2018-07-24 ENCOUNTER — Encounter: Payer: Self-pay | Admitting: Family

## 2018-07-26 ENCOUNTER — Ambulatory Visit (HOSPITAL_COMMUNITY)
Admission: RE | Admit: 2018-07-26 | Discharge: 2018-07-26 | Disposition: A | Discharge status: Home / Self Care | Payer: Managed Care, Other (non HMO) | Source: Ambulatory Visit | Attending: Obstetrics & Gynecology | Admitting: Obstetrics & Gynecology

## 2018-07-26 DIAGNOSIS — Z9079 Acquired absence of other genital organ(s): Secondary | ICD-10-CM | POA: Diagnosis present

## 2018-07-26 DIAGNOSIS — N971 Female infertility of tubal origin: Secondary | ICD-10-CM | POA: Insufficient documentation

## 2018-07-26 MED ORDER — IOPAMIDOL (ISOVUE-300) INJECTION 61%
30.0000 mL | Freq: Once | INTRAVENOUS | Status: AC | PRN
  Administered 2018-07-26: 6 mL

## 2018-09-08 ENCOUNTER — Other Ambulatory Visit: Payer: Managed Care, Other (non HMO)

## 2018-10-04 ENCOUNTER — Other Ambulatory Visit: Payer: Managed Care, Other (non HMO)

## 2018-10-11 ENCOUNTER — Other Ambulatory Visit: Payer: Managed Care, Other (non HMO)

## 2018-10-26 ENCOUNTER — Ambulatory Visit
Admission: RE | Admit: 2018-10-26 | Discharge: 2018-10-26 | Disposition: A | Discharge status: Home / Self Care | Payer: Managed Care, Other (non HMO) | Source: Ambulatory Visit | Attending: Family | Admitting: Family

## 2018-10-26 ENCOUNTER — Ambulatory Visit: Payer: Managed Care, Other (non HMO)

## 2018-10-26 DIAGNOSIS — N6489 Other specified disorders of breast: Secondary | ICD-10-CM

## 2019-02-26 ENCOUNTER — Encounter: Payer: Self-pay | Admitting: Family

## 2019-02-26 ENCOUNTER — Other Ambulatory Visit: Payer: Self-pay

## 2019-02-26 ENCOUNTER — Ambulatory Visit (INDEPENDENT_AMBULATORY_CARE_PROVIDER_SITE_OTHER): Payer: Managed Care, Other (non HMO) | Admitting: Family

## 2019-02-26 VITALS — BP 117/78 | HR 69 | Temp 98.2°F | Resp 18 | Ht 67.0 in | Wt 192.6 lb

## 2019-02-26 DIAGNOSIS — F329 Major depressive disorder, single episode, unspecified: Secondary | ICD-10-CM | POA: Diagnosis not present

## 2019-02-26 DIAGNOSIS — Z8249 Family history of ischemic heart disease and other diseases of the circulatory system: Secondary | ICD-10-CM | POA: Diagnosis not present

## 2019-02-26 DIAGNOSIS — Z23 Encounter for immunization: Secondary | ICD-10-CM | POA: Diagnosis not present

## 2019-02-26 DIAGNOSIS — Z0000 Encounter for general adult medical examination without abnormal findings: Secondary | ICD-10-CM | POA: Diagnosis not present

## 2019-02-26 DIAGNOSIS — F32A Depression, unspecified: Secondary | ICD-10-CM

## 2019-02-26 LAB — CBC WITH DIFFERENTIAL/PLATELET
BASOS ABS: 0.1 10*3/uL (ref 0.0–0.1)
BASOS PCT: 0.8 % (ref 0.0–3.0)
EOS ABS: 0.2 10*3/uL (ref 0.0–0.7)
Eosinophils Relative: 2.9 % (ref 0.0–5.0)
HCT: 42.4 % (ref 36.0–46.0)
Hemoglobin: 13.8 g/dL (ref 12.0–15.0)
LYMPHS ABS: 1.3 10*3/uL (ref 0.7–4.0)
Lymphocytes Relative: 16.7 % (ref 12.0–46.0)
MCHC: 32.6 g/dL (ref 30.0–36.0)
MCV: 85 fl (ref 78.0–100.0)
Monocytes Absolute: 0.4 10*3/uL (ref 0.1–1.0)
Monocytes Relative: 5.7 % (ref 3.0–12.0)
NEUTROS ABS: 5.7 10*3/uL (ref 1.4–7.7)
NEUTROS PCT: 73.9 % (ref 43.0–77.0)
PLATELETS: 216 10*3/uL (ref 150.0–400.0)
RBC: 4.99 Mil/uL (ref 3.87–5.11)
RDW: 14.4 % (ref 11.5–15.5)
WBC: 7.7 10*3/uL (ref 4.0–10.5)

## 2019-02-26 LAB — LIPID PANEL
Cholesterol: 171 mg/dL (ref 0–200)
HDL: 49.7 mg/dL (ref >39.00)
LDL Cholesterol: 108 mg/dL — ABNORMAL HIGH (ref 0–99)
NONHDL: 121.73
TRIGLYCERIDES: 71 mg/dL (ref 0.0–149.0)
Total CHOL/HDL Ratio: 3
VLDL: 14.2 mg/dL (ref 0.0–40.0)

## 2019-02-26 LAB — BASIC METABOLIC PANEL
BUN: 13 mg/dL (ref 6–23)
CHLORIDE: 105 meq/L (ref 96–112)
CO2: 26 meq/L (ref 19–32)
CREATININE: 0.82 mg/dL (ref 0.40–1.20)
Calcium: 9.4 mg/dL (ref 8.4–10.5)
GFR: 92.23 mL/min (ref >60.00)
GLUCOSE: 89 mg/dL (ref 70–99)
POTASSIUM: 4.3 meq/L (ref 3.5–5.1)
Sodium: 138 mEq/L (ref 135–145)

## 2019-02-26 LAB — TSH: TSH: 0.94 u[IU]/mL (ref 0.35–4.50)

## 2019-02-26 LAB — HEPATIC FUNCTION PANEL
ALK PHOS: 50 U/L (ref 39–117)
ALT: 10 U/L (ref 0–35)
AST: 13 U/L (ref 0–37)
Albumin: 4.2 g/dL (ref 3.5–5.2)
Bilirubin, Direct: 0.2 mg/dL (ref 0.0–0.3)
Total Bilirubin: 1.3 mg/dL — ABNORMAL HIGH (ref 0.2–1.2)
Total Protein: 7.3 g/dL (ref 6.0–8.3)

## 2019-02-26 MED ORDER — AMOXICILLIN-POT CLAVULANATE 875-125 MG PO TABS
1.0000 | ORAL_TABLET | Freq: Two times a day (BID) | ORAL | 0 refills | Status: DC
Start: 2019-02-26 — End: 2019-04-23

## 2019-02-26 MED ORDER — SERTRALINE HCL 50 MG PO TABS: ORAL_TABLET | ORAL | 0 refills | Status: DC

## 2019-02-26 NOTE — Patient Instructions (Signed)
Please complete lab work prior to leaving. Begin zoloft 1/2 tab once daily at bedtime for 1 week, then increase to a full tab once daily.  Begin augmentin for sinus infection, call if new/worsening symptoms or if symptom are not improved in 1 week.

## 2019-02-26 NOTE — Progress Notes (Signed)
Subjective:    Patient ID: Dana Carpenter, female    DOB: 06/25/1976, 43 y.o.   MRN: [id number]  HPI   Patient presents today for complete physical.  Immunizations: flu shot today, tetanus up to date Diet: reports diet is so so Wt Readings from Last 3 Encounters:  02/26/19 192 lb 9.6 oz (87.4 kg)  07/06/18 208 lb 6.4 oz (94.5 kg)  06/28/18 207 lb (93.9 kg)  Exercise: not exercising regularly Pap Smear: 07/06/18 Mammogram: will schedule Vision: 5/19 Dental:  Up to date  + cardiac history on her mom's side.   Has been using nasal saline.  Using for 2 months without improvement.   Anxiety- still dealing with her son's death. Went for 6 months. Some difficulty motivating, difficulty sleeping. Does not look forward to things.  Does not feel like she has family support.  Denies SI/HI.  Reports + panic attacks.  Sometimes occur out of the blue.  Sometimes when she goes out in public.   Review of Systems  Constitutional: Negative for unexpected weight change.  HENT: Negative for rhinorrhea.        + sinus HA, sinus drainage, has had black/grey   Respiratory: Negative for cough and shortness of breath.   Cardiovascular: Negative for chest pain.  Genitourinary: Negative for dysuria, frequency, hematuria and menstrual problem.  Musculoskeletal: Negative for arthralgias and myalgias.  Hematological: Negative for adenopathy.  Psychiatric/Behavioral:       Reports + anxiety    Past Medical History:  Diagnosis Date  . Anxiety   . Depression   . PE (pulmonary embolism) 03/26/2013   RT LOWER LOBE  . Strep pharyngitis      Social History   Socioeconomic History  . Marital status: Single    Spouse name: Not on file  . Number of children: Not on file  . Years of education: Not on file  . Highest education level: Not on file  Occupational History  . Not on file  Social Needs  . Financial resource strain: Not on file  . Food insecurity:    Worry: Not on file    Inability: Not on  file  . Transportation needs:    Medical: Not on file    Non-medical: Not on file  Tobacco Use  . Smoking status: Never Smoker  . Smokeless tobacco: Never Used  . Tobacco comment: never used tobacco  Substance and Sexual Activity  . Alcohol use: No    Alcohol/week: 0.0 standard drinks    Comment: occasional  . Drug use: No  . Sexual activity: Yes    Partners: Male    Birth control/protection: None  Lifestyle  . Physical activity:    Days per week: Not on file    Minutes per session: Not on file  . Stress: Not on file  Relationships  . Social connections:    Talks on phone: Not on file    Gets together: Not on file    Attends religious service: Not on file    Active member of club or organization: Not on file    Attends meetings of clubs or organizations: Not on file    Relationship status: Not on file  . Intimate partner violence:    Fear of current or ex partner: Not on file    Emotionally abused: Not on file    Physically abused: Not on file    Forced sexual activity: Not on file  Other Topics Concern  . Not on file  Social History Narrative   Lives with 2 sons [name] 2008, [name] 2011   Older son born 55- in and out of the home   Boyfriend, but single   Works in [employer]   Enjoys sleeping.   Completed some college   Sister lives locally, most of her family in [place] and has 2 aunts in [place]    Past Surgical History:  Procedure Laterality Date  . DIAGNOSTIC LAPAROSCOPY WITH REMOVAL OF ECTOPIC PREGNANCY N/A 01/27/2017   Procedure: DIAGNOSTIC LAPAROSCOPY WITH RIGHT SALPINGECTOMY FOR RIGHT ECTOPIC PREGNANCY;  Surgeon: Chancy Milroy, MD;  Location: [facility];  Service: Gynecology;  Laterality: N/A;  . WISDOM TOOTH EXTRACTION Bilateral     Family History  Problem Relation Age of Onset  . Breast cancer Paternal Grandmother   . Diabetes Paternal Grandfather   . Prostate cancer Paternal Grandfather   . Hypertension Father   . Heart  disease Maternal Grandfather   . Cardiomyopathy Maternal Grandfather   . Healthy Sister        x 1  . Diabetes Sister 86       type II  . Hypertension Sister   . Heart attack Mother 64       deceased- also had lung disorder (? pulmonary fibrosis)  . Lung disease Mother   . Heart disease Maternal Aunt        heart disease  . Cardiomyopathy Maternal Aunt   . Heart disease Cousin   . Cardiomyopathy Maternal Aunt   . Cardiomyopathy Maternal Aunt     No Known Allergies  Current Outpatient Medications on File Prior to Visit  Medication Sig Dispense Refill  . Multiple Vitamins-Minerals (MULTIVITAMIN WITH MINERALS) tablet Take 1 tablet by mouth daily.     No current facility-administered medications on file prior to visit.     BP 117/78 (BP Location: Right Arm, Patient Position: Sitting, Cuff Size: Normal)   Pulse 69   Temp 98.2 F (36.8 C) (Oral)   Resp 18   Ht 5\' 7"  (1.702 m)   Wt 192 lb 9.6 oz (87.4 kg)   SpO2 100%   BMI 30.17 kg/m        Objective:   Physical Exam  Physical Exam  Constitutional: She is oriented to person, place, and time. She appears well-developed and well-nourished. No distress.  HENT:  Head: Normocephalic and atraumatic. + maxillary and sinus tenderness to palpation.  Right Ear: Tympanic membrane and ear canal normal.  Left Ear: Tympanic membrane and ear canal normal.  Mouth/Throat: Oropharynx is clear and moist.  Eyes: Pupils are equal, round, and reactive to light. No scleral icterus.  Neck: Normal range of motion. No thyromegaly present.  Cardiovascular: Normal rate and regular rhythm.   No murmur heard. Pulmonary/Chest: Effort normal and breath sounds normal. No respiratory distress. He has no wheezes. She has no rales. She exhibits no tenderness.  Abdominal: Soft. Bowel sounds are normal. She exhibits no distension and no mass. There is no tenderness. There is no rebound and no guarding.  Musculoskeletal: She exhibits no edema.   Lymphadenopathy:    She has no cervical adenopathy.  Neurological: She is alert and oriented to person, place, and time. She has normal patellar reflexes. She exhibits normal muscle tone. Coordination normal.  Skin: Skin is warm and dry.  Psychiatric: She has a normal mood and affect. Her behavior is normal. Judgment and thought content normal.         Assessment & Plan:  Preventative care- discussed healthy diet, exercise and weight loss. Flu shot today.  Depression- uncontrolled.  Will initiate sertraline 50mg . I instructed pt to start 1/2 tablet once daily for 1 week and then increase to a full tablet once daily on week two as tolerated.  We discussed common side effects such as nausea, drowsiness and weight gain.  Also discussed rare but serious side effect of suicide ideation.  She is instructed to discontinue medication and go directly to ED if this occurs.  Pt verbalizes understanding.  Plan follow up in 1 month to evaluate progress.    Family hx of cardiomyopathy-will arrange a cardiology consultation.        Assessment & Plan:

## 2019-03-06 ENCOUNTER — Telehealth: Payer: Self-pay | Admitting: Family

## 2019-03-06 NOTE — Telephone Encounter (Signed)
Copied from [place] [id number]. Topic: General - Other >> Mar 02, 2019  4:40 PM Wynetta Emery, [place] C wrote: Reason for CRM: pt says that she was prescribed amoxicillin. Pt says that she forgot to make PCP aware that amoxicillin causes her to have a yeast infection. Pt would like to know if PCP would send in something to pharmacy to help?  Pharmacy: [id number] [address], [place] (Phone) [phone] (Fax) >> Mar 06, 2019  3:58 PM Waylan Rocher, [place] L wrote: Patient wants diflucan instead

## 2019-03-07 ENCOUNTER — Telehealth: Payer: Self-pay

## 2019-03-07 MED ORDER — FLUCONAZOLE 150 MG PO TABS: ORAL_TABLET | ORAL | 0 refills | Status: DC

## 2019-03-07 NOTE — Telephone Encounter (Signed)
Please advise 

## 2019-03-07 NOTE — Telephone Encounter (Signed)
Talked to patient earlier, advised rx for diflucan was sent to her pharmacy

## 2019-03-07 NOTE — Telephone Encounter (Signed)
Please notify pt that I sent an rx for diflucan.  Call if symptoms worsen or fail to improve.

## 2019-03-07 NOTE — Telephone Encounter (Signed)
Patient advised of rx sent and to call if not better or if symptoms worsen. Patient notified we are doing virtual visits in the event she needs further evaluation.

## 2019-03-07 NOTE — Telephone Encounter (Signed)
Copied from [place] [id number]. Topic: General - Other >> Mar 06, 2019  3:50 PM Rainey [place] A wrote: Reason for CRM: Patient called and left a message in general mailbox stating that no one has given her a callback in regards to her prescription. Pt did not leave the name of prescription on voicemail. . I called pt and was unable to reach her but did leave a voicemail.

## 2019-03-21 ENCOUNTER — Other Ambulatory Visit: Payer: Self-pay | Admitting: Family

## 2019-03-26 ENCOUNTER — Ambulatory Visit: Payer: Managed Care, Other (non HMO) | Admitting: Family

## 2019-03-30 ENCOUNTER — Telehealth: Payer: Managed Care, Other (non HMO) | Admitting: Cardiology

## 2019-03-30 ENCOUNTER — Other Ambulatory Visit: Payer: Self-pay

## 2019-03-30 ENCOUNTER — Telehealth: Payer: Self-pay

## 2019-03-30 NOTE — Telephone Encounter (Signed)
Called patient three times and left voicemail to call back, will continue efforts.

## 2019-04-02 ENCOUNTER — Other Ambulatory Visit: Payer: Self-pay

## 2019-04-02 ENCOUNTER — Ambulatory Visit: Payer: Managed Care, Other (non HMO) | Admitting: Family

## 2019-04-02 DIAGNOSIS — Z91199 Patient's noncompliance with other medical treatment and regimen due to unspecified reason: Secondary | ICD-10-CM

## 2019-04-02 DIAGNOSIS — Z5329 Procedure and treatment not carried out because of patient's decision for other reasons: Secondary | ICD-10-CM

## 2019-04-02 NOTE — Progress Notes (Signed)
No show

## 2019-04-16 ENCOUNTER — Other Ambulatory Visit: Payer: Self-pay | Admitting: Family

## 2019-04-17 NOTE — Telephone Encounter (Signed)
Please contact pt. She missed her virtual visit and is past due for med follow up on zoloft. I sent a 7 day supply. She will need OV prior to additional refills.

## 2019-04-19 NOTE — Telephone Encounter (Signed)
Patient scheduled for Monday.

## 2019-04-23 ENCOUNTER — Other Ambulatory Visit: Payer: Self-pay

## 2019-04-23 ENCOUNTER — Telehealth: Payer: Self-pay | Admitting: Family

## 2019-04-23 ENCOUNTER — Telehealth (INDEPENDENT_AMBULATORY_CARE_PROVIDER_SITE_OTHER): Payer: Managed Care, Other (non HMO) | Admitting: Family

## 2019-04-23 DIAGNOSIS — F419 Anxiety disorder, unspecified: Secondary | ICD-10-CM | POA: Diagnosis not present

## 2019-04-23 DIAGNOSIS — G4700 Insomnia, unspecified: Secondary | ICD-10-CM

## 2019-04-23 DIAGNOSIS — F329 Major depressive disorder, single episode, unspecified: Secondary | ICD-10-CM

## 2019-04-23 DIAGNOSIS — F32A Depression, unspecified: Secondary | ICD-10-CM

## 2019-04-23 MED ORDER — ZOLPIDEM TARTRATE 5 MG PO TABS: 5.0000 mg | ORAL_TABLET | Freq: Every evening | ORAL | 0 refills | Status: DC | PRN

## 2019-04-23 MED ORDER — FLUOXETINE HCL 10 MG PO CAPS: 10.0000 mg | ORAL_CAPSULE | Freq: Every day | ORAL | 1 refills | Status: DC

## 2019-04-23 NOTE — Progress Notes (Signed)
Virtual Visit via Video Note  I connected with Dana Carpenter 04/23/19 at  9:40 AM EDT by a video enabled telemedicine application and verified that I am speaking with the correct person using two identifiers. This visit type was conducted due to national recommendations for restrictions regarding the COVID-19 Pandemic (e.g. social distancing).  This format is felt to be most appropriate for this patient at this time.   I discussed the limitations of evaluation and management by telemedicine and the availability of in person appointments. The patient expressed understanding and agreed to proceed.  Only the patient and myself were on today's video visit. The patient was at home and I was at home at the time of today's visit.   History of Present Illness:   Last visit she described low motivation, anhedonia, panic attacks.  We started her on zoloft. She reports that she felt very sleepy on the zoloft. Reports bad insomnia and bad dreams on zoloft. As a result she discontinued about a week ago. Still having insomnia. Has tried 4 melatonin and 2 benadryl in the past without improvement.  Reports that she is not sleeping well, eating a lot very emotions.  Reports that she still has panic attacks occasionally. Working from home and does not leave the house very often.   Son (who passed) would have been 21 in April. Cannot get herself together emotionally since his birthday.  Gets very frustrated when she goes out in public.  Not seeing a therapist. Denies SI/HI.    Video Visit from 04/23/2019 in [facility] at [employer]  PHQ-9 Total Score  25        Observations/Objective:   Gen: Awake, alert, no acute distress Resp: Breathing is even and non-labored Psych: calm, flattened affect Neuro: Alert and Oriented x 3, + facial symmetry, speech is clear.  Assessment and Plan:  Depression/anxiety- uncontrolled. D/c zoloft. Start trial of prozac 10mg .  Encouraged her to  establish with a therapist and I gave her the number for [facility] to call for a new patient appointment.    Insomnia-uncontrolled.  will give a short course of PRN ambien. Hopefully when we get her depression/anxiety under better control we can discontinue use of ambien.    Follow Up Instructions:    I discussed the assessment and treatment plan with the patient. The patient was provided an opportunity to ask questions and all were answered. The patient agreed with the plan and demonstrated an understanding of the instructions.   The patient was advised to call back or seek an in-person evaluation if the symptoms worsen or if the condition fails to improve as anticipated.    Nance Pear, NP

## 2019-04-23 NOTE — Telephone Encounter (Signed)
Return in about 4 weeks (around 05/21/2019) for follow up visit.  Left VM for patient to call back to set up appt

## 2019-05-15 ENCOUNTER — Other Ambulatory Visit: Payer: Self-pay | Admitting: Family

## 2019-05-15 NOTE — Telephone Encounter (Signed)
Sent a 30 day supply of prozac but I would like to bring her back in for a follow up before we send additional refills. Please contact pt to schedule a virtual visit.

## 2019-06-08 ENCOUNTER — Other Ambulatory Visit: Payer: Self-pay | Admitting: Family

## 2019-08-27 ENCOUNTER — Other Ambulatory Visit: Payer: Self-pay | Admitting: Family

## 2019-11-20 ENCOUNTER — Other Ambulatory Visit: Payer: Self-pay | Admitting: Family

## 2019-11-20 NOTE — Telephone Encounter (Signed)
Please contact pt to arrange a follow up virtual visit- Mondays preferably.

## 2019-11-21 NOTE — Telephone Encounter (Signed)
Doxyme for Monday 10:20

## 2019-11-26 ENCOUNTER — Other Ambulatory Visit: Payer: Self-pay

## 2019-11-26 ENCOUNTER — Encounter: Payer: Self-pay | Admitting: Family

## 2019-11-26 ENCOUNTER — Ambulatory Visit (INDEPENDENT_AMBULATORY_CARE_PROVIDER_SITE_OTHER): Payer: Managed Care, Other (non HMO) | Admitting: Family

## 2019-11-26 DIAGNOSIS — F32A Depression, unspecified: Secondary | ICD-10-CM

## 2019-11-26 DIAGNOSIS — F329 Major depressive disorder, single episode, unspecified: Secondary | ICD-10-CM

## 2019-11-26 DIAGNOSIS — G4700 Insomnia, unspecified: Secondary | ICD-10-CM

## 2019-11-26 DIAGNOSIS — R635 Abnormal weight gain: Secondary | ICD-10-CM

## 2019-11-26 MED ORDER — FLUOXETINE HCL 20 MG PO TABS: 20.0000 mg | ORAL_TABLET | Freq: Every day | ORAL | 3 refills | Status: DC

## 2019-11-26 MED ORDER — ZOLPIDEM TARTRATE 10 MG PO TABS
10.0000 mg | ORAL_TABLET | Freq: Every evening | ORAL | 0 refills | Status: DC | PRN
Start: 2019-11-26 — End: 2020-03-31

## 2019-11-26 NOTE — Progress Notes (Signed)
Virtual Visit via Video Note  I connected with Dana Carpenter on 11/26/19 at 10:20 AM EST by a video enabled telemedicine application and verified that I am speaking with the correct person using two identifiers.  Location: Patient: home Provider: home   I discussed the limitations of evaluation and management by telemedicine and the availability of in person appointments. The patient expressed understanding and agreed to proceed.  History of Present Illness:  Patient is a 43 yr old female who presents today for follow up of her depression.  Depression- She is maintained on prozac 10mg  which we started back in May. States that she does not feel much of a difference on this dose. Notes that she has been struggling a bit more recently due to the holiday's and the fact that 12/30 is the anniversary of her son's death.   She reports that the [place] worked while she was taking it and before she ran out. She has been taking benadryl and melatonin to go to sleep. This helps her fall asleep but then she wakes up at 2AM.   Reports + weight gain. 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   Wt Readings from Last 3 Encounters:  02/26/19 192 lb 9.6 oz (87.4 kg)  07/06/18 208 lb 6.4 oz (94.5 kg)  06/28/18 207 lb (93.9 kg)   Past Medical History:  Diagnosis Date  . Anxiety   . Depression   . PE (pulmonary embolism) 03/26/2013   RT LOWER LOBE  . Strep pharyngitis      Social History   Socioeconomic History  . Marital status: Single    Spouse name: Not on file  . Number of children: Not on file  . Years of education: Not on file  . Highest education level: Not on file  Occupational History  . Not on file  Tobacco Use  . Smoking status: Never Smoker  . Smokeless tobacco: Never Used  . Tobacco comment: never used tobacco  Substance and Sexual Activity  . Alcohol use: No    Alcohol/week: 0.0 standard drinks    Comment: occasional  . Drug use: No  .  Sexual activity: Yes    Partners: Male    Birth control/protection: None  Other Topics Concern  . Not on file  Social History Narrative   Lives with 2 sons [name] 2008, [name] 2011   Older son born 36- in and out of the home   Boyfriend, but single   Works in [employer]   Enjoys sleeping.   Completed some college   Sister lives locally, most of her family in [place] and has 2 aunts in [place]   Social Determinants of Health   Financial Resource Strain:   . Difficulty of Paying Living Expenses: Not on file  Food Insecurity:   . Worried About Charity fundraiser in the Last Year: Not on file  . Ran Out of Food in the Last Year: Not on file  Transportation Needs:   . Lack of Transportation (Medical): Not on file  . Lack of Transportation (Non-Medical): Not on file  Physical Activity:   . Days of Exercise per Week: Not on file  . Minutes of Exercise per Session: Not on file  Stress:   . Feeling of Stress : Not on file  Social Connections:   . Frequency of Communication with Friends and Family: Not on file  . Frequency of Social Gatherings with Friends and Family:  Not on file  . Attends Religious Services: Not on file  . Active Member of Clubs or Organizations: Not on file  . Attends Archivist Meetings: Not on file  . Marital Status: Not on file  Intimate Partner Violence:   . Fear of Current or Ex-Partner: Not on file  . Emotionally Abused: Not on file  . Physically Abused: Not on file  . Sexually Abused: Not on file    Past Surgical History:  Procedure Laterality Date  . DIAGNOSTIC LAPAROSCOPY WITH REMOVAL OF ECTOPIC PREGNANCY N/A 01/27/2017   Procedure: DIAGNOSTIC LAPAROSCOPY WITH RIGHT SALPINGECTOMY FOR RIGHT ECTOPIC PREGNANCY;  Surgeon: Chancy Milroy, MD;  Location: [facility];  Service: Gynecology;  Laterality: N/A;  . WISDOM TOOTH EXTRACTION Bilateral     Family History  Problem Relation Age of Onset  . Breast cancer Paternal  Grandmother   . Diabetes Paternal Grandfather   . Prostate cancer Paternal Grandfather   . Hypertension Father   . Heart disease Maternal Grandfather   . Cardiomyopathy Maternal Grandfather   . Healthy Sister        x 1  . Diabetes Sister 76       type II  . Hypertension Sister   . Heart attack Mother 5       deceased- also had lung disorder (? pulmonary fibrosis)  . Lung disease Mother   . Heart disease Maternal Aunt        heart disease  . Cardiomyopathy Maternal Aunt   . Heart disease Cousin   . Cardiomyopathy Maternal Aunt   . Cardiomyopathy Maternal Aunt     No Known Allergies  Current Outpatient Medications on File Prior to Visit  Medication Sig Dispense Refill  . Multiple Vitamins-Minerals (MULTIVITAMIN WITH MINERALS) tablet Take 1 tablet by mouth daily.     No current facility-administered medications on file prior to visit.    There were no vitals taken for this visit.      Observations/Objective:   Gen: Awake, alert, no acute distress Resp: Breathing is even and non-labored Psych: calm/pleasant demeanor Neuro: Alert and Oriented x 3, + facial symmetry, speech is clear.   Assessment and Plan:  Depression- uncontrolled. Will increase prozac from 10mg  to 20mg .  Insomnia- uncontrolled. Refilled ambien. Plan to bring her back for in person visit in 1 month so we can obtain a UDS and have her fill out a controlled substance contract.  Weight gain- We discussed diet and exercise.  I advised her that if her weight is up next visit we may need to consider an alternative rx for prozac. She verbalizes understanding.   Follow Up Instructions:    I discussed the assessment and treatment plan with the patient. The patient was provided an opportunity to ask questions and all were answered. The patient agreed with the plan and demonstrated an understanding of the instructions.   The patient was advised to call back or seek an in-person evaluation if the symptoms  worsen or if the condition fails to improve as anticipated.  Nance Pear, NP

## 2019-12-04 ENCOUNTER — Ambulatory Visit (INDEPENDENT_AMBULATORY_CARE_PROVIDER_SITE_OTHER): Payer: Managed Care, Other (non HMO) | Admitting: Internal Medicine

## 2019-12-04 ENCOUNTER — Other Ambulatory Visit: Payer: Self-pay

## 2019-12-04 DIAGNOSIS — R05 Cough: Secondary | ICD-10-CM

## 2019-12-04 DIAGNOSIS — R519 Headache, unspecified: Secondary | ICD-10-CM

## 2019-12-04 DIAGNOSIS — R059 Cough, unspecified: Secondary | ICD-10-CM

## 2019-12-04 DIAGNOSIS — J019 Acute sinusitis, unspecified: Secondary | ICD-10-CM | POA: Diagnosis not present

## 2019-12-04 DIAGNOSIS — R110 Nausea: Secondary | ICD-10-CM

## 2019-12-04 DIAGNOSIS — R0981 Nasal congestion: Secondary | ICD-10-CM

## 2019-12-04 MED ORDER — AMOXICILLIN 875 MG PO TABS: 875.0000 mg | ORAL_TABLET | Freq: Two times a day (BID) | ORAL | 0 refills | Status: DC

## 2019-12-04 NOTE — Progress Notes (Signed)
Subjective:    Patient ID: Dana Carpenter, female    DOB: 1976-06-13, 43 y.o.   MRN: [id number]  DOS:  12/04/2019 Type of visit - description: Virtual Visit via Video Note  I connected with the above patient  by a video enabled telemedicine application and verified that I am speaking with the correct person using two identifiers.   THIS ENCOUNTER IS A VIRTUAL VISIT DUE TO COVID-19 - PATIENT WAS NOT SEEN IN THE OFFICE. PATIENT HAS CONSENTED TO VIRTUAL VISIT / TELEMEDICINE VISIT   Location of patient: home  Location of provider: office  I discussed the limitations of evaluation and management by telemedicine and the availability of in person appointments. The patient expressed understanding and agreed to proceed.  History of Present Illness: Acute Symptoms started 3 days ago: Headache located at the forehead and behind the eyes bilaterally, sinus pressure, blowing green and yellow discharge from the nose.  Headache increased with moving her head. Also some cough which is dry. Symptoms are very similar to previous sinus infection. This time however OTCs are not helping. Overall today feels slightly better than she did with the onset of symptoms    Review of Systems No fever chills No vomiting or diarrhea No rash Taste and smell are normal  Past Medical History:  Diagnosis Date  . Anxiety   . Depression   . PE (pulmonary embolism) 03/26/2013   RT LOWER LOBE  . Strep pharyngitis     Past Surgical History:  Procedure Laterality Date  . DIAGNOSTIC LAPAROSCOPY WITH REMOVAL OF ECTOPIC PREGNANCY N/A 01/27/2017   Procedure: DIAGNOSTIC LAPAROSCOPY WITH RIGHT SALPINGECTOMY FOR RIGHT ECTOPIC PREGNANCY;  Surgeon: Chancy Milroy, MD;  Location: [facility];  Service: Gynecology;  Laterality: N/A;  . WISDOM TOOTH EXTRACTION Bilateral     Social History   Socioeconomic History  . Marital status: Single    Spouse name: Not on file  . Number of children: Not on file  . Years of  education: Not on file  . Highest education level: Not on file  Occupational History  . Not on file  Tobacco Use  . Smoking status: Never Smoker  . Smokeless tobacco: Never Used  . Tobacco comment: never used tobacco  Substance and Sexual Activity  . Alcohol use: No    Alcohol/week: 0.0 standard drinks    Comment: occasional  . Drug use: No  . Sexual activity: Yes    Partners: Male    Birth control/protection: None  Other Topics Concern  . Not on file  Social History Narrative   Lives with 2 sons [name] 2008, [name] 2011   Older son born 25- in and out of the home   Boyfriend, but single   Works in [employer]   Enjoys sleeping.   Completed some college   Sister lives locally, most of her family in [place] and has 2 aunts in [place]   Social Determinants of Health   Financial Resource Strain:   . Difficulty of Paying Living Expenses: Not on file  Food Insecurity:   . Worried About Charity fundraiser in the Last Year: Not on file  . Ran Out of Food in the Last Year: Not on file  Transportation Needs:   . Lack of Transportation (Medical): Not on file  . Lack of Transportation (Non-Medical): Not on file  Physical Activity:   . Days of Exercise per Week: Not on file  . Minutes of Exercise per Session: Not on file  Stress:   . Feeling of Stress : Not on file  Social Connections:   . Frequency of Communication with Friends and Family: Not on file  . Frequency of Social Gatherings with Friends and Family: Not on file  . Attends Religious Services: Not on file  . Active Member of Clubs or Organizations: Not on file  . Attends Archivist Meetings: Not on file  . Marital Status: Not on file  Intimate Partner Violence:   . Fear of Current or Ex-Partner: Not on file  . Emotionally Abused: Not on file  . Physically Abused: Not on file  . Sexually Abused: Not on file      Allergies as of 12/04/2019   No Known Allergies     Medication  List       Accurate as of December 04, 2019  9:38 AM. If you have any questions, ask your nurse or doctor.        FLUoxetine 20 MG tablet Commonly known as: PROZAC Take 1 tablet (20 mg total) by mouth daily.   multivitamin with minerals tablet Take 1 tablet by mouth daily.   zolpidem 10 MG tablet Commonly known as: AMBIEN Take 1 tablet (10 mg total) by mouth at bedtime as needed for sleep.           Objective:   Physical Exam LMP 11/20/2019  This is a virtual video visit, she is alert oriented x3, no apparent distress.  Speaking in complete sentences.  Face is symmetric.  No cough noted.    Assessment   43 year old female, in general healthy, history of pulmonary emboli before, not on birth control, LMP 11/20/2019 presents with:  Sinusitis: Symptoms are consistent with previous history of sinusitis, she does not look toxic.  We agreed on the following: Amoxicillin, continue OTCs including Flonase, call if not gradually better, call anytime if severe symptoms, fever chills. We will proceed with testing for Covid at our facility however if she gets a external testing I recommend her to keep [place] posted. She verbalized understanding and took notes of my instructions.    I discussed the assessment and treatment plan with the patient. The patient was provided an opportunity to ask questions and all were answered. The patient agreed with the plan and demonstrated an understanding of the instructions.   The patient was advised to call back or seek an in-person evaluation if the symptoms worsen or if the condition fails to improve as anticipated.

## 2019-12-20 ENCOUNTER — Other Ambulatory Visit: Payer: Self-pay | Admitting: Family

## 2019-12-20 ENCOUNTER — Other Ambulatory Visit: Payer: Self-pay

## 2019-12-21 ENCOUNTER — Encounter: Payer: Self-pay | Admitting: Medical

## 2019-12-21 ENCOUNTER — Ambulatory Visit (HOSPITAL_BASED_OUTPATIENT_CLINIC_OR_DEPARTMENT_OTHER)
Admission: RE | Admit: 2019-12-21 | Discharge: 2019-12-21 | Disposition: A | Discharge status: Home / Self Care | Payer: Managed Care, Other (non HMO) | Source: Ambulatory Visit | Attending: Medical | Admitting: Medical

## 2019-12-21 ENCOUNTER — Ambulatory Visit (INDEPENDENT_AMBULATORY_CARE_PROVIDER_SITE_OTHER): Payer: Managed Care, Other (non HMO) | Admitting: Medical

## 2019-12-21 VITALS — Temp 99.0°F | Ht 66.0 in | Wt 210.0 lb

## 2019-12-21 DIAGNOSIS — R05 Cough: Secondary | ICD-10-CM | POA: Insufficient documentation

## 2019-12-21 DIAGNOSIS — R059 Cough, unspecified: Secondary | ICD-10-CM

## 2019-12-21 DIAGNOSIS — J40 Bronchitis, not specified as acute or chronic: Secondary | ICD-10-CM

## 2019-12-21 MED ORDER — AZITHROMYCIN 250 MG PO TABS: ORAL_TABLET | ORAL | 0 refills | Status: DC

## 2019-12-21 MED ORDER — HYDROCODONE-HOMATROPINE 5-1.5 MG/5ML PO SYRP: 5.0000 mL | ORAL_SOLUTION | Freq: Four times a day (QID) | ORAL | 0 refills | Status: DC | PRN

## 2019-12-21 NOTE — Progress Notes (Signed)
Subjective:    Patient ID: Dana Carpenter, female    DOB: 1976/02/18, 44 y.o.   MRN: [id number]  HPI  Virtual Visit via Video Note  I connected with Dana Carpenter on 12/21/19 at  8:40 AM EST by a video enabled telemedicine application and verified that I am speaking with the correct person using two identifiers.  Location: Patient: home Provider: home   I discussed the limitations of evaluation and management by telemedicine and the availability of in person appointments. The patient expressed understanding and agreed to proceed.  Pt did not have bp machine or thermometer.  History of Present Illness:   Pt has recent cough that is moderate to severe. Pt states present since 12-06-2018.   Pt states cough is more at night. Pt states no obvious wheeze. Pt can feel a lot of pnd on back of throat. Pt states cough is productive.    Pt states some sneezing.   No fever, no chills, no nausea, no vomiting,no bodyaches and  no change in taste or smell.  Pt states in past does not have hx of severe cough.  No recent heart burn.  Pt got covid test on 12-13-2018 and was negative. Got done outside of [facility]. Result not in system.  No hx of chronic allergies but does get some sinus infection.  End of 2020 tx with amoxilllin an flonase.   Observations/Objective: General-no acute distress, pleasant, oriented. Sons nasal congested. Lungs- on inspection lungs appear unlabored. Neck- no tracheal deviation or jvd on inspection. Neuro- gross motor function appears intact. heent- she does not have any sinus pressure on self palpation.   Assessment and Plan: Signs an symptoms currently of bronchitis. Will rx azithromycin antibiotic  and hycodan cough medication. Rx advisement given. CXR today.  Update by my chart on Monday. If cough not improved then consider rx taper prednisone with albuterol inhaler.  covid test neg recently and doubt presently. If symptoms persist or worsen  then  reconsider and reteest..  Follow up 7 days or as needed  Follow Up Instructions:    I discussed the assessment and treatment plan with the patient. The patient was provided an opportunity to ask questions and all were answered. The patient agreed with the plan and demonstrated an understanding of the instructions.   The patient was advised to call back or seek an in-person evaluation if the symptoms worsen or if the condition fails to improve as anticipated.  I provided 20 minutes of non-face-to-face time during this encounter.   Mackie Pai, PA-C    Review of Systems  Constitutional: Positive for fatigue. Negative for chills and fever.       But not sleeping due to cough.  HENT: Positive for congestion.   Respiratory: Positive for cough. Negative for chest tightness, shortness of breath and wheezing.        Sob only after repeat cough episodes.  Productive cough.  Cardiovascular: Negative for chest pain and palpitations.  Gastrointestinal: Negative for abdominal pain.  Musculoskeletal: Negative for back pain and myalgias.  Skin: Negative for rash.  Neurological: Negative for dizziness and headaches.  Hematological: Negative for adenopathy. Does not bruise/bleed easily.  Psychiatric/Behavioral: Negative for behavioral problems.    Past Medical History:  Diagnosis Date  . Anxiety   . Depression   . PE (pulmonary embolism) 03/26/2013   RT LOWER LOBE  . Strep pharyngitis      Social History   Socioeconomic History  . Marital status: Single  Spouse name: Not on file  . Number of children: Not on file  . Years of education: Not on file  . Highest education level: Not on file  Occupational History  . Not on file  Tobacco Use  . Smoking status: Never Smoker  . Smokeless tobacco: Never Used  . Tobacco comment: never used tobacco  Substance and Sexual Activity  . Alcohol use: No    Alcohol/week: 0.0 standard drinks    Comment: occasional  . Drug use: No  .  Sexual activity: Yes    Partners: Male    Birth control/protection: None  Other Topics Concern  . Not on file  Social History Narrative   Lives with 2 sons [name] 2008, [name] 2011   Older son born 63- in and out of the home   Boyfriend, but single   Works in [employer]   Enjoys sleeping.   Completed some college   Sister lives locally, most of her family in [place] and has 2 aunts in [place]   Social Determinants of Health   Financial Resource Strain:   . Difficulty of Paying Living Expenses: Not on file  Food Insecurity:   . Worried About Charity fundraiser in the Last Year: Not on file  . Ran Out of Food in the Last Year: Not on file  Transportation Needs:   . Lack of Transportation (Medical): Not on file  . Lack of Transportation (Non-Medical): Not on file  Physical Activity:   . Days of Exercise per Week: Not on file  . Minutes of Exercise per Session: Not on file  Stress:   . Feeling of Stress : Not on file  Social Connections:   . Frequency of Communication with Friends and Family: Not on file  . Frequency of Social Gatherings with Friends and Family: Not on file  . Attends Religious Services: Not on file  . Active Member of Clubs or Organizations: Not on file  . Attends Archivist Meetings: Not on file  . Marital Status: Not on file  Intimate Partner Violence:   . Fear of Current or Ex-Partner: Not on file  . Emotionally Abused: Not on file  . Physically Abused: Not on file  . Sexually Abused: Not on file    Past Surgical History:  Procedure Laterality Date  . DIAGNOSTIC LAPAROSCOPY WITH REMOVAL OF ECTOPIC PREGNANCY N/A 01/27/2017   Procedure: DIAGNOSTIC LAPAROSCOPY WITH RIGHT SALPINGECTOMY FOR RIGHT ECTOPIC PREGNANCY;  Surgeon: Chancy Milroy, MD;  Location: [facility];  Service: Gynecology;  Laterality: N/A;  . WISDOM TOOTH EXTRACTION Bilateral     Family History  Problem Relation Age of Onset  . Breast cancer Paternal  Grandmother   . Diabetes Paternal Grandfather   . Prostate cancer Paternal Grandfather   . Hypertension Father   . Heart disease Maternal Grandfather   . Cardiomyopathy Maternal Grandfather   . Healthy Sister        x 1  . Diabetes Sister 58       type II  . Hypertension Sister   . Heart attack Mother 19       deceased- also had lung disorder (? pulmonary fibrosis)  . Lung disease Mother   . Heart disease Maternal Aunt        heart disease  . Cardiomyopathy Maternal Aunt   . Heart disease Cousin   . Cardiomyopathy Maternal Aunt   . Cardiomyopathy Maternal Aunt     No Known Allergies  Current Outpatient  Medications on File Prior to Visit  Medication Sig Dispense Refill  . FLUoxetine (PROZAC) 20 MG tablet TAKE 1 TABLET BY MOUTH EVERY DAY (Patient taking differently: 20 mg. ) 90 tablet 2  . Multiple Vitamins-Minerals (MULTIVITAMIN WITH MINERALS) tablet Take 1 tablet by mouth daily.    . traMADol (ULTRAM) 50 MG tablet Take 50 mg by mouth every 6 (six) hours as needed.    . zolpidem (AMBIEN) 10 MG tablet Take 1 tablet (10 mg total) by mouth at bedtime as needed for sleep. 30 tablet 0   No current facility-administered medications on file prior to visit.    Temp 99 F (37.2 C) (Temporal)   Ht 5\' 6"  (1.676 m)   Wt 210 lb (95.3 kg)   BMI 33.89 kg/m       Objective:   Physical Exam        Assessment & Plan:

## 2019-12-21 NOTE — Patient Instructions (Signed)
Signs an symptoms currently of bronchitis. Will rx azithromycin antibiotic  and hycodan cough medication. Rx advisement given. CXR today.  Update by my chart on Monday. If cough not improved then consider rx taper prednisone with albuterol inhaler.  covid test neg recently and doubt presently. If symptoms persist or worsen  then reconsider and reteest..  Follow up 7 days or as needed

## 2020-03-31 ENCOUNTER — Encounter: Payer: Self-pay | Admitting: Family

## 2020-03-31 ENCOUNTER — Other Ambulatory Visit: Payer: Self-pay

## 2020-03-31 ENCOUNTER — Ambulatory Visit (INDEPENDENT_AMBULATORY_CARE_PROVIDER_SITE_OTHER): Payer: Managed Care, Other (non HMO) | Admitting: Family

## 2020-03-31 VITALS — BP 117/70 | HR 85 | Temp 97.7°F | Resp 18 | Ht 66.0 in | Wt 212.6 lb

## 2020-03-31 DIAGNOSIS — Z0000 Encounter for general adult medical examination without abnormal findings: Secondary | ICD-10-CM

## 2020-03-31 DIAGNOSIS — R17 Unspecified jaundice: Secondary | ICD-10-CM

## 2020-03-31 DIAGNOSIS — F418 Other specified anxiety disorders: Secondary | ICD-10-CM

## 2020-03-31 DIAGNOSIS — J019 Acute sinusitis, unspecified: Secondary | ICD-10-CM | POA: Diagnosis not present

## 2020-03-31 LAB — BASIC METABOLIC PANEL
BUN: 11 mg/dL (ref 6–23)
CO2: 26 mEq/L (ref 19–32)
Calcium: 8.8 mg/dL (ref 8.4–10.5)
Chloride: 106 mEq/L (ref 96–112)
Creatinine, Ser: 0.86 mg/dL (ref 0.40–1.20)
GFR: 86.85 mL/min (ref >60.00)
Glucose, Bld: 97 mg/dL (ref 70–99)
Potassium: 3.9 mEq/L (ref 3.5–5.1)
Sodium: 138 mEq/L (ref 135–145)

## 2020-03-31 LAB — CBC WITH DIFFERENTIAL/PLATELET
Basophils Absolute: 0.1 10*3/uL (ref 0.0–0.1)
Basophils Relative: 1.7 % (ref 0.0–3.0)
Eosinophils Absolute: 0.3 10*3/uL (ref 0.0–0.7)
Eosinophils Relative: 5.5 % — ABNORMAL HIGH (ref 0.0–5.0)
HCT: 38.8 % (ref 36.0–46.0)
Hemoglobin: 12.6 g/dL (ref 12.0–15.0)
Lymphocytes Relative: 22.1 % (ref 12.0–46.0)
Lymphs Abs: 1.2 10*3/uL (ref 0.7–4.0)
MCHC: 32.6 g/dL (ref 30.0–36.0)
MCV: 85.7 fl (ref 78.0–100.0)
Monocytes Absolute: 0.4 10*3/uL (ref 0.1–1.0)
Monocytes Relative: 6.8 % (ref 3.0–12.0)
Neutro Abs: 3.4 10*3/uL (ref 1.4–7.7)
Neutrophils Relative %: 63.9 % (ref 43.0–77.0)
Platelets: 214 10*3/uL (ref 150.0–400.0)
RBC: 4.52 Mil/uL (ref 3.87–5.11)
RDW: 14.1 % (ref 11.5–15.5)
WBC: 5.4 10*3/uL (ref 4.0–10.5)

## 2020-03-31 LAB — HEPATIC FUNCTION PANEL
ALT: 11 U/L (ref 0–35)
AST: 15 U/L (ref 0–37)
Albumin: 3.8 g/dL (ref 3.5–5.2)
Alkaline Phosphatase: 56 U/L (ref 39–117)
Bilirubin, Direct: 0.1 mg/dL (ref 0.0–0.3)
Total Bilirubin: 0.7 mg/dL (ref 0.2–1.2)
Total Protein: 6.9 g/dL (ref 6.0–8.3)

## 2020-03-31 LAB — LIPID PANEL
Cholesterol: 156 mg/dL (ref 0–200)
HDL: 39.9 mg/dL (ref >39.00)
LDL Cholesterol: 102 mg/dL — ABNORMAL HIGH (ref 0–99)
NonHDL: 115.84
Total CHOL/HDL Ratio: 4
Triglycerides: 68 mg/dL (ref 0.0–149.0)
VLDL: 13.6 mg/dL (ref 0.0–40.0)

## 2020-03-31 LAB — TSH: TSH: 1.18 u[IU]/mL (ref 0.35–4.50)

## 2020-03-31 MED ORDER — OLOPATADINE HCL 0.1 % OP SOLN: 1.0000 [drp] | Freq: Two times a day (BID) | OPHTHALMIC | 5 refills | Status: DC

## 2020-03-31 MED ORDER — FLUOXETINE HCL 40 MG PO CAPS: 40.0000 mg | ORAL_CAPSULE | Freq: Every day | ORAL | 0 refills | Status: DC

## 2020-03-31 MED ORDER — FLUCONAZOLE 150 MG PO TABS: 150.0000 mg | ORAL_TABLET | Freq: Once | ORAL | 0 refills | Status: AC

## 2020-03-31 MED ORDER — AMOXICILLIN-POT CLAVULANATE 875-125 MG PO TABS
1.0000 | ORAL_TABLET | Freq: Two times a day (BID) | ORAL | 0 refills | Status: DC
Start: 2020-03-31 — End: 2020-09-30

## 2020-03-31 MED ORDER — FLUTICASONE PROPIONATE 50 MCG/ACT NA SUSP: 2.0000 | Freq: Every day | NASAL | 6 refills | Status: DC

## 2020-03-31 MED ORDER — LORATADINE 10 MG PO TABS
10.0000 mg | ORAL_TABLET | Freq: Every day | ORAL | 11 refills | Status: DC
Start: 2020-03-31 — End: 2021-07-08

## 2020-03-31 MED FILL — AMOX-CLAV 875-125 MG TABLET: 875-125 | 10 days supply | Qty: 20 | Fill #0

## 2020-03-31 MED FILL — FLUCONAZOLE 150 MG TABS: 150 | 1 days supply | Qty: 1 | Fill #0

## 2020-03-31 NOTE — Progress Notes (Signed)
Subjective:    Patient ID: Dana Carpenter, female    DOB: [birth year], 44 y.o.   MRN: [id number]  HPI   Patient is a 44 yr old female who presents today for complete physical.  Immunizations: Tdap 2019 Diet: needs improvement Exercise: not currently. In the process of moving. Plans to restart Wt Readings from Last 3 Encounters:  03/31/20 212 lb 9.6 oz (96.4 kg)  12/21/19 210 lb (95.3 kg)  02/26/19 192 lb 9.6 oz (87.4 kg)  Pap Smear: 07/06/2018 Mammogram:  Due [facility]: last year Dental: up to date  Depression- reports that she feels like she is frightened.  Not sleeping well at all. Reports that ambien caused bad dreams.  Reports sleeping poorly- only getting 3 hrs a night of sleep.  Feels a general fogginess/HA's the last few weeks. She is "spitting up a lot of green."  Has pain in her cheeks.  She is not taking any medications for allergies.  She uses a sinus rinse which helps some.      Review of Systems  Constitutional: Negative for unexpected weight change.  HENT: Positive for hearing loss (feels like left ear is blocked) and rhinorrhea.   Eyes: Negative for visual disturbance.  Respiratory: Negative for cough and shortness of breath.   Cardiovascular: Negative for chest pain.  Gastrointestinal: Positive for constipation (some chronic constipation). Negative for diarrhea.  Genitourinary: Negative for dysuria, frequency and menstrual problem.  Musculoskeletal: Negative for arthralgias and myalgias.  Skin: Negative for rash.  Neurological: Positive for headaches (sinus HA's).  Hematological: Negative for adenopathy.  Psychiatric/Behavioral:       See HPI   Past Medical History:  Diagnosis Date  . Anxiety   . Depression   . PE (pulmonary embolism) 03/26/2013   RT LOWER LOBE  . Strep pharyngitis      Social History   Socioeconomic History  . Marital status: Single    Spouse name: Not on file  . Number of children: Not on file  . Years of education: Not on file  .  Highest education level: Not on file  Occupational History  . Not on file  Tobacco Use  . Smoking status: Never Smoker  . Smokeless tobacco: Never Used  . Tobacco comment: never used tobacco  Substance and Sexual Activity  . Alcohol use: No    Alcohol/week: 0.0 standard drinks    Comment: occasional  . Drug use: No  . Sexual activity: Yes    Partners: Male    Birth control/protection: None  Other Topics Concern  . Not on file  Social History Narrative   Lives with 2 sons Dana Carpenter 2008, Dana Carpenter 2011   Older son born 38- in and out of the home   Boyfriend, but single   Works in [employer]   Enjoys sleeping.   Completed some college   Sister lives locally, most of her family in [place] and has 2 aunts in [place]   Social Determinants of Health   Financial Resource Strain:   . Difficulty of Paying Living Expenses:   Food Insecurity:   . Worried About Charity fundraiser in the Last Year:   . Arboriculturist in the Last Year:   Transportation Needs:   . Film/video editor (Medical):   Marland Kitchen Lack of Transportation (Non-Medical):   Physical Activity:   . Days of Exercise per Week:   . Minutes of Exercise per Session:   Stress:   . Feeling of Stress :  Social Connections:   . Frequency of Communication with Friends and Family:   . Frequency of Social Gatherings with Friends and Family:   . Attends Religious Services:   . Active Member of Clubs or Organizations:   . Attends Archivist Meetings:   Marland Kitchen Marital Status:   Intimate Partner Violence:   . Fear of Current or Ex-Partner:   . Emotionally Abused:   Marland Kitchen Physically Abused:   . Sexually Abused:     Past Surgical History:  Procedure Laterality Date  . DIAGNOSTIC LAPAROSCOPY WITH REMOVAL OF ECTOPIC PREGNANCY N/A 01/27/2017   Procedure: DIAGNOSTIC LAPAROSCOPY WITH RIGHT SALPINGECTOMY FOR RIGHT ECTOPIC PREGNANCY;  Surgeon: Chancy Milroy, MD;  Location: [facility];  Service: Gynecology;   Laterality: N/A;  . WISDOM TOOTH EXTRACTION Bilateral     Family History  Problem Relation Age of Onset  . Breast cancer Paternal Grandmother   . Diabetes Paternal Grandfather   . Prostate cancer Paternal Grandfather   . Hypertension Father   . Heart disease Maternal Grandfather   . Cardiomyopathy Maternal Grandfather   . Healthy Sister        x 1  . Diabetes Sister 8       type II  . Hypertension Sister   . Heart attack Mother 35       deceased- also had lung disorder (? pulmonary fibrosis)  . Lung disease Mother   . Heart disease Maternal Aunt        heart disease  . Cardiomyopathy Maternal Aunt   . Heart disease Cousin   . Cardiomyopathy Maternal Aunt   . Cardiomyopathy Maternal Aunt     No Known Allergies  Current Outpatient Medications on File Prior to Visit  Medication Sig Dispense Refill  . Multiple Vitamins-Minerals (MULTIVITAMIN WITH MINERALS) tablet Take 1 tablet by mouth daily.     No current facility-administered medications on file prior to visit.    BP 117/70 (BP Location: Left Arm, Patient Position: Sitting, Cuff Size: Large)   Pulse 85   Temp 97.7 F (36.5 C) (Temporal)   Resp 18   Ht 5\' 6"  (1.676 m)   Wt 212 lb 9.6 oz (96.4 kg)   LMP 03/14/2020   SpO2 98%   BMI 34.31 kg/m       Objective:   Physical Exam  Physical Exam  Constitutional: She is oriented to person, place, and time. She appears well-developed and well-nourished. No distress.  HENT:  Head: Normocephalic and atraumatic.  Right Ear: Tympanic membrane and ear canal normal.  Left Ear: Tympanic membrane and ear canal normal.  Mouth/Throat: not examined, pt wearing mask Eyes: Pupils are equal, round, and reactive to light. No scleral icterus.  Neck: Normal range of motion. No thyromegaly present.  Cardiovascular: Normal rate and regular rhythm.   No murmur heard. Pulmonary/Chest: Effort normal and breath sounds normal. No respiratory distress. He has no wheezes. She has no  rales. She exhibits no tenderness.  Abdominal: Soft. Bowel sounds are normal. She exhibits no distension and no mass. There is no tenderness. There is no rebound and no guarding.  Musculoskeletal: She exhibits no edema.  Lymphadenopathy:    She has no cervical adenopathy.  Neurological: She is alert and oriented to person, place, and time. She has normal patellar reflexes. She exhibits normal muscle tone. Coordination normal.  Skin: Skin is warm and dry.  Psychiatric: She has a normal mood and affect. Her behavior is normal. Judgment and thought content normal.  Breast/pelvic:  deferred          Assessment & Plan:   Preventative care- discussed healthy diet, exercise, weight loss. Immunizations reviewed and up to date. She plans to complete mammogram and pap smear with GYN. Obtain routine lab work.  Depression/anxiety- uncontrolled on prozac 20mg . Pt advised to schedule an appointment with counseling. Also advised the following:  Please increase your prozac 20mg  tabs to 1.5 tabs once daily for 1 week, then switch to 40mg  caps once daily on week two.  Sinusitis- rx with augmentin.  Recommended addition of claritin/flonase for seasonal allergies. Rx sent for diflucan to have on hand as she tends to get yeast infections with augmentin.  This visit occurred during the SARS-CoV-2 public health emergency.  Safety protocols were in place, including screening questions prior to the visit, additional usage of staff PPE, and extensive cleaning of exam room while observing appropriate contact time as indicated for disinfecting solutions.        Assessment & Plan:

## 2020-03-31 NOTE — Patient Instructions (Addendum)
Please increase your prozac 54m tabs to 1.5 tabs once daily for 1 week, then switch to 41mcaps once daily on week two. Start augmentin for sinus infection. You may use the diflucan tablet as needed if you develop a yeast infection. Complete lab work prior to leaving. Please call [name] schedule an appointment with a counselor- [phone]  Preventive Care [id number] Old, Female Preventive care refers to visits with your health care provider and lifestyle choices that can promote health and wellness. This includes:  A yearly physical exam. This may also be called an annual well check.  Regular dental visits and eye exams.  Immunizations.  Screening for certain conditions.  Healthy lifestyle choices, such as eating a healthy diet, getting regular exercise, not using drugs or products that contain nicotine and tobacco, and limiting alcohol use. What can I expect for my preventive care visit? Physical exam Your health care provider will check your:  Height and weight. This may be used to calculate body mass index (BMI), which tells if you are at a healthy weight.  Heart rate and blood pressure.  Skin for abnormal spots. Counseling Your health care provider may ask you questions about your:  Alcohol, tobacco, and drug use.  Emotional well-being.  Home and relationship well-being.  Sexual activity.  Eating habits.  Work and work enStatistician Method of birth control.  Menstrual cycle.  Pregnancy history. What immunizations do I need?  Influenza (flu) vaccine  This is recommended every year. Tetanus, diphtheria, and pertussis (Tdap) vaccine  You may need a Td booster every 10 years. Varicella (chickenpox) vaccine  You may need this if you have not been vaccinated. Zoster (shingles) vaccine  You may need this after age 4810Measles, mumps, and rubella (MMR) vaccine  You may need at least one dose of MMR if you were born in 1957 or  later. You may also need a second dose. Pneumococcal conjugate (PCV13) vaccine  You may need this if you have certain conditions and were not previously vaccinated. Pneumococcal polysaccharide (PPSV23) vaccine  You may need one or two doses if you smoke cigarettes or if you have certain conditions. Meningococcal conjugate (MenACWY) vaccine  You may need this if you have certain conditions. Hepatitis A vaccine  You may need this if you have certain conditions or if you travel or work in places where you may be exposed to hepatitis A. Hepatitis B vaccine  You may need this if you have certain conditions or if you travel or work in places where you may be exposed to hepatitis B. Haemophilus influenzae type b (Hib) vaccine  You may need this if you have certain conditions. Human papillomavirus (HPV) vaccine  If recommended by your health care provider, you may need three doses over 6 months. You may receive vaccines as individual doses or as more than one vaccine together in one shot (combination vaccines). Talk with your health care provider about the risks and benefits of combination vaccines. What tests do I need? Blood tests  Lipid and cholesterol levels. These may be checked every 5 years, or more frequently if you are over 5050ears old.  Hepatitis C test.  Hepatitis B test. Screening  Lung cancer screening. You may have this screening every year starting at age 8162f you have a 30-pack-year history of smoking and currently smoke or have quit within the past 15 years.  Colorectal cancer screening. All adults should have this screening starting at age  47 and continuing until age 74. Your health care provider may recommend screening at age 99 if you are at increased risk. You will have tests every 1-10 years, depending on your results and the type of screening test.  Diabetes screening. This is done by checking your blood sugar (glucose) after you have not eaten for a while  (fasting). You may have this done every 1-3 years.  Mammogram. This may be done every 1-2 years. Talk with your health care provider about when you should start having regular mammograms. This may depend on whether you have a family history of breast cancer.  BRCA-related cancer screening. This may be done if you have a family history of breast, ovarian, tubal, or peritoneal cancers.  Pelvic exam and Pap test. This may be done every 3 years starting at age 52. Starting at age 4, this may be done every 5 years if you have a Pap test in combination with an HPV test. Other tests  Sexually transmitted disease (STD) testing.  Bone density scan. This is done to screen for osteoporosis. You may have this scan if you are at high risk for osteoporosis. Follow these instructions at home: Eating and drinking  Eat a diet that includes fresh fruits and vegetables, whole grains, lean protein, and low-fat dairy.  Take vitamin and mineral supplements as recommended by your health care provider.  Do not drink alcohol if: ? Your health care provider tells you not to drink. ? You are pregnant, may be pregnant, or are planning to become pregnant.  If you drink alcohol: ? Limit how much you have to 0-1 drink a day. ? Be aware of how much alcohol is in your drink. In the [place] one drink equals one 12 oz bottle of beer (355 mL), one 5 oz glass of wine (148 mL), or one 1 oz glass of hard liquor (44 mL). Lifestyle  Take daily care of your teeth and gums.  Stay active. Exercise for at least 30 minutes on 5 or more days each week.  Do not use any products that contain nicotine or tobacco, such as cigarettes, e-cigarettes, and chewing tobacco. If you need help quitting, ask your health care provider.  If you are sexually active, practice safe sex. Use a condom or other form of birth control (contraception) in order to prevent pregnancy and STIs (sexually transmitted infections).  If told by your health  care provider, take low-dose aspirin daily starting at age 70. What's next?  Visit your health care provider once a year for a well check visit.  Ask your health care provider how often you should have your eyes and teeth checked.  Stay up to date on all vaccines. This information is not intended to replace advice given to you by your health care provider. Make sure you discuss any questions you have with your health care provider. Document Revised: 08/03/2018 Document Reviewed: 08/03/2018 Elsevier Patient Education  2020 [employer].

## 2020-04-03 ENCOUNTER — Other Ambulatory Visit: Payer: Self-pay | Admitting: Family

## 2020-04-03 DIAGNOSIS — N6489 Other specified disorders of breast: Secondary | ICD-10-CM

## 2020-04-22 ENCOUNTER — Other Ambulatory Visit: Payer: Self-pay

## 2020-04-22 ENCOUNTER — Ambulatory Visit
Admission: RE | Admit: 2020-04-22 | Discharge: 2020-04-22 | Disposition: A | Discharge status: Home / Self Care | Payer: Managed Care, Other (non HMO) | Source: Ambulatory Visit | Attending: Family | Admitting: Family

## 2020-04-22 DIAGNOSIS — N6489 Other specified disorders of breast: Secondary | ICD-10-CM

## 2020-04-28 ENCOUNTER — Ambulatory Visit: Payer: Managed Care, Other (non HMO) | Admitting: Family

## 2020-04-28 DIAGNOSIS — Z0289 Encounter for other administrative examinations: Secondary | ICD-10-CM

## 2020-06-24 ENCOUNTER — Other Ambulatory Visit: Payer: Self-pay | Admitting: Family

## 2020-07-28 ENCOUNTER — Ambulatory Visit (INDEPENDENT_AMBULATORY_CARE_PROVIDER_SITE_OTHER): Payer: Managed Care, Other (non HMO) | Admitting: Family Medicine

## 2020-07-28 ENCOUNTER — Other Ambulatory Visit: Payer: Self-pay

## 2020-07-28 ENCOUNTER — Other Ambulatory Visit (HOSPITAL_COMMUNITY)
Admission: RE | Admit: 2020-07-28 | Discharge: 2020-07-28 | Disposition: A | Discharge status: Home / Self Care | Payer: Managed Care, Other (non HMO) | Source: Ambulatory Visit | Attending: Family Medicine | Admitting: Family Medicine

## 2020-07-28 ENCOUNTER — Encounter: Payer: Self-pay | Admitting: Family Medicine

## 2020-07-28 VITALS — BP 111/65 | HR 72 | Ht 66.0 in | Wt 216.0 lb

## 2020-07-28 DIAGNOSIS — N898 Other specified noninflammatory disorders of vagina: Secondary | ICD-10-CM | POA: Diagnosis not present

## 2020-07-28 DIAGNOSIS — Z01419 Encounter for gynecological examination (general) (routine) without abnormal findings: Secondary | ICD-10-CM | POA: Diagnosis not present

## 2020-07-28 NOTE — Progress Notes (Signed)
GYNECOLOGY ANNUAL PREVENTATIVE CARE ENCOUNTER NOTE  Subjective:   Dana Carpenter is a 44 y.o. [phone] female here for a routine annual gynecologic exam.  Current complaints: vaginal odor.   Denies abnormal vaginal bleeding, discharge, pelvic pain, problems with intercourse or other gynecologic concerns.    No perimenopausal symptoms.   Gynecologic History Patient's last menstrual period was 07/08/2020. Patient is sexually active  Contraception: none Last Pap: 2019. Results were: normal Last mammogram: 04/2020. Results were: normal  Obstetric History OB History  Gravida Para Term Preterm AB Living  8 4 4   4 4   SAB TAB Ectopic Multiple Live Births    2 2   4     # Outcome Date GA Lbr Len/2nd Weight Sex Delivery Anes PTL Lv  8 Term 2011 [redacted]w[redacted]d  7 lb (3.175 kg) M Vag-Spont   LIV  7 Term 2008 [redacted]w[redacted]d  7 lb (3.175 kg) M Vag-Spont   LIV  6 Term 1999 [redacted]w[redacted]d  7 lb (3.175 kg) M Vag-Spont   LIV  5 Term 1997 [redacted]w[redacted]d  7 lb (3.175 kg) M Vag-Spont   LIV  4 TAB           3 TAB           2 Ectopic           1 Ectopic             Past Medical History:  Diagnosis Date  . Anxiety   . Depression   . PE (pulmonary embolism) 03/26/2013   RT LOWER LOBE  . Strep pharyngitis     Past Surgical History:  Procedure Laterality Date  . DIAGNOSTIC LAPAROSCOPY WITH REMOVAL OF ECTOPIC PREGNANCY N/A 01/27/2017   Procedure: DIAGNOSTIC LAPAROSCOPY WITH RIGHT SALPINGECTOMY FOR RIGHT ECTOPIC PREGNANCY;  Surgeon: Chancy Milroy, MD;  Location: [facility];  Service: Gynecology;  Laterality: N/A;  . WISDOM TOOTH EXTRACTION Bilateral     Current Outpatient Medications on File Prior to Visit  Medication Sig Dispense Refill  . FLUoxetine (PROZAC) 40 MG capsule TAKE 1 CAPSULE BY MOUTH EVERY DAY 90 capsule 0  . Multiple Vitamins-Minerals (MULTIVITAMIN WITH MINERALS) tablet Take 1 tablet by mouth daily.    Marland Kitchen amoxicillin-clavulanate (AUGMENTIN) 875-125 MG tablet Take 1 tablet by mouth 2 (two) times daily. (Patient not  taking: Reported on 07/28/2020) 20 tablet 0  . fluticasone (FLONASE) 50 MCG/ACT nasal spray Place 2 sprays into both nostrils daily. (Patient not taking: Reported on 07/28/2020) 16 g 6  . loratadine (CLARITIN) 10 MG tablet Take 1 tablet (10 mg total) by mouth daily. (Patient not taking: Reported on 07/28/2020) 30 tablet 11  . olopatadine (PATANOL) 0.1 % ophthalmic solution Place 1 drop into both eyes 2 (two) times daily. (Patient not taking: Reported on 07/28/2020) 5 mL 5   No current facility-administered medications on file prior to visit.    No Known Allergies  Social History   Socioeconomic History  . Marital status: Single    Spouse name: Not on file  . Number of children: Not on file  . Years of education: Not on file  . Highest education level: Not on file  Occupational History  . Not on file  Tobacco Use  . Smoking status: Never Smoker  . Smokeless tobacco: Never Used  . Tobacco comment: never used tobacco  Substance and Sexual Activity  . Alcohol use: No    Alcohol/week: 0.0 standard drinks    Comment: occasional  . Drug use: No  .  Sexual activity: Yes    Partners: Male    Birth control/protection: None  Other Topics Concern  . Not on file  Social History Narrative   Lives with 2 sons [name] 2008, [name] 2011   Older son born 42- in and out of the home   Boyfriend, but single   Works in [employer]   Enjoys sleeping.   Completed some college   Sister lives locally, most of her family in [place] and has 2 aunts in [employer]gallery [employer]   Social Determinants of Health   Financial Resource Strain:   . Difficulty of Paying Living Expenses: Not on file  Food Insecurity:   . Worried About Charity fundraiser in the Last Year: Not on file  . Ran Out of Food in the Last Year: Not on file  Transportation Needs:   . Lack of Transportation (Medical): Not on file  . Lack of Transportation (Non-Medical): Not on file  Physical Activity:   . Days of Exercise  per Week: Not on file  . Minutes of Exercise per Session: Not on file  Stress:   . Feeling of Stress : Not on file  Social Connections:   . Frequency of Communication with Friends and Family: Not on file  . Frequency of Social Gatherings with Friends and Family: Not on file  . Attends Religious Services: Not on file  . Active Member of Clubs or Organizations: Not on file  . Attends Archivist Meetings: Not on file  . Marital Status: Not on file  Intimate Partner Violence:   . Fear of Current or Ex-Partner: Not on file  . Emotionally Abused: Not on file  . Physically Abused: Not on file  . Sexually Abused: Not on file    Family History  Problem Relation Age of Onset  . Breast cancer Paternal Grandmother   . Diabetes Paternal Grandfather   . Prostate cancer Paternal Grandfather   . Hypertension Father   . Heart disease Maternal Grandfather   . Cardiomyopathy Maternal Grandfather   . Healthy Sister        x 1  . Diabetes Sister 41       type II  . Hypertension Sister   . Heart attack Mother 53       deceased- also had lung disorder (? pulmonary fibrosis)  . Lung disease Mother   . Heart disease Maternal Aunt        heart disease  . Cardiomyopathy Maternal Aunt   . Heart disease Cousin   . Cardiomyopathy Maternal Aunt   . Cardiomyopathy Maternal Aunt     The following portions of the patient's history were reviewed and updated as appropriate: allergies, current medications, past family history, past medical history, past social history, past surgical history and problem list.  Review of Systems Pertinent items are noted in HPI.   Objective:  BP 111/65   Pulse 72   Ht 5\' 6"  (1.676 m)   Wt 216 lb (98 kg)   LMP 07/08/2020   BMI 34.86 kg/m  Wt Readings from Last 3 Encounters:  07/28/20 216 lb (98 kg)  03/31/20 212 lb 9.6 oz (96.4 kg)  12/21/19 210 lb (95.3 kg)     Chaperone present during exam  CONSTITUTIONAL: Well-developed, well-nourished female in  no acute distress.  HENT:  Normocephalic, atraumatic, External right and left ear normal. Oropharynx is clear and moist EYES: Conjunctivae and EOM are normal. Pupils are equal, round, and reactive to light. No scleral  icterus.  NECK: Normal range of motion, supple, no masses.  Normal thyroid.   CARDIOVASCULAR: Normal heart rate noted, regular rhythm RESPIRATORY: Clear to auscultation bilaterally. Effort and breath sounds normal, no problems with respiration noted. BREASTS: Symmetric in size. No masses, skin changes, nipple drainage, or lymphadenopathy. ABDOMEN: Soft, normal bowel sounds, no distention noted.  No tenderness, rebound or guarding.  PELVIC: Normal appearing external genitalia; normal appearing vaginal mucosa and cervix.  No abnormal discharge noted. MUSCULOSKELETAL: Normal range of motion. No tenderness.  No cyanosis, clubbing, or edema.  2+ distal pulses. SKIN: Skin is warm and dry. No rash noted. Not diaphoretic. No erythema. No pallor. NEUROLOGIC: Alert and oriented to person, place, and time. Normal reflexes, muscle tone coordination. No cranial nerve deficit noted. PSYCHIATRIC: Normal mood and affect. Normal behavior. Normal judgment and thought content.  Assessment:  Annual gynecologic examination with pap smear   Plan:  1. Well Woman Exam Mammogram scheduled STD testing discussed. Patient requested testing  2. Vaginal irritation - Cervicovaginal ancillary only( [facility])   Routine preventative health maintenance measures emphasized. Please refer to After Visit Summary for other counseling recommendations.    Loma Boston, [place] for [employer]

## 2020-07-29 LAB — CERVICOVAGINAL ANCILLARY ONLY
Bacterial Vaginitis (gardnerella): NEGATIVE
Candida Glabrata: NEGATIVE
Candida Vaginitis: NEGATIVE
Chlamydia: NEGATIVE
Comment: NEGATIVE
Comment: NEGATIVE
Comment: NEGATIVE
Comment: NEGATIVE
Comment: NEGATIVE
Comment: NORMAL
Neisseria Gonorrhea: NEGATIVE
Trichomonas: NEGATIVE

## 2020-09-30 ENCOUNTER — Telehealth (INDEPENDENT_AMBULATORY_CARE_PROVIDER_SITE_OTHER): Payer: Managed Care, Other (non HMO) | Admitting: Family

## 2020-09-30 ENCOUNTER — Other Ambulatory Visit: Payer: Self-pay

## 2020-09-30 ENCOUNTER — Ambulatory Visit (HOSPITAL_BASED_OUTPATIENT_CLINIC_OR_DEPARTMENT_OTHER)
Admission: RE | Admit: 2020-09-30 | Discharge: 2020-09-30 | Disposition: A | Discharge status: Home / Self Care | Payer: Managed Care, Other (non HMO) | Source: Ambulatory Visit | Attending: Family | Admitting: Family

## 2020-09-30 ENCOUNTER — Encounter: Payer: Self-pay | Admitting: Family

## 2020-09-30 VITALS — BP 122/78 | HR 70 | Resp 16

## 2020-09-30 DIAGNOSIS — R0602 Shortness of breath: Secondary | ICD-10-CM | POA: Diagnosis not present

## 2020-09-30 DIAGNOSIS — J189 Pneumonia, unspecified organism: Secondary | ICD-10-CM | POA: Diagnosis not present

## 2020-09-30 DIAGNOSIS — R002 Palpitations: Secondary | ICD-10-CM

## 2020-09-30 DIAGNOSIS — R0789 Other chest pain: Secondary | ICD-10-CM

## 2020-09-30 LAB — POCT URINE PREGNANCY: Preg Test, Ur: NEGATIVE

## 2020-09-30 MED ORDER — IOHEXOL 350 MG/ML SOLN
100.0000 mL | Freq: Once | INTRAVENOUS | Status: AC | PRN
  Administered 2020-09-30: 100 mL via INTRAVENOUS

## 2020-09-30 MED ORDER — AZITHROMYCIN 250 MG PO TABS: ORAL_TABLET | ORAL | 0 refills | Status: DC

## 2020-09-30 NOTE — Progress Notes (Signed)
Virtual Visit via Video Note  I connected with Dana Carpenter on 09/30/20 at  1:40 PM EDT by a video enabled telemedicine application and verified that I am speaking with the correct person using two identifiers.  Location: Patient: home Provider: work   I discussed the limitations of evaluation and management by telemedicine and the availability of in person appointments. The patient expressed understanding and agreed to proceed. Only the patient and myself were present for today's video call.   History of Present Illness:  Patient reports palpitations "all day" for the last 1 week.  She reports that the palpitations are constant. She denies associated anxiety but does endorse associated SOB which started 2 days ago.    Reports that cough is mild and intermittent.  Had pfizer vaccines.  She denies associated fever.  No loss of taste or smell.  Reports that periods have been heavy.    HR 76 BPM reports HR is regular per patient report  Past Medical History:  Diagnosis Date  . Anxiety   . Depression   . PE (pulmonary embolism) 03/26/2013   RT LOWER LOBE  . Strep pharyngitis      Social History   Socioeconomic History  . Marital status: Single    Spouse name: Not on file  . Number of children: Not on file  . Years of education: Not on file  . Highest education level: Not on file  Occupational History  . Not on file  Tobacco Use  . Smoking status: Never Smoker  . Smokeless tobacco: Never Used  . Tobacco comment: never used tobacco  Substance and Sexual Activity  . Alcohol use: No    Alcohol/week: 0.0 standard drinks    Comment: occasional  . Drug use: No  . Sexual activity: Yes    Partners: Male    Birth control/protection: None  Other Topics Concern  . Not on file  Social History Narrative   Lives with 2 sons [name] 2008, [name] 2011   Older son born 50- in and out of the home   Boyfriend, but single   Works in [employer]   Enjoys sleeping.    Completed some college   Sister lives locally, most of her family in [place] and has 2 aunts in [employer]   Social Determinants of Health   Financial Resource Strain:   . Difficulty of Paying Living Expenses: Not on file  Food Insecurity:   . Worried About Charity fundraiser in the Last Year: Not on file  . Ran Out of Food in the Last Year: Not on file  Transportation Needs:   . Lack of Transportation (Medical): Not on file  . Lack of Transportation (Non-Medical): Not on file  Physical Activity:   . Days of Exercise per Week: Not on file  . Minutes of Exercise per Session: Not on file  Stress:   . Feeling of Stress : Not on file  Social Connections:   . Frequency of Communication with Friends and Family: Not on file  . Frequency of Social Gatherings with Friends and Family: Not on file  . Attends Religious Services: Not on file  . Active Member of Clubs or Organizations: Not on file  . Attends Archivist Meetings: Not on file  . Marital Status: Not on file  Intimate Partner Violence:   . Fear of Current or Ex-Partner: Not on file  . Emotionally Abused: Not on file  . Physically Abused: Not on file  .  Sexually Abused: Not on file    Past Surgical History:  Procedure Laterality Date  . DIAGNOSTIC LAPAROSCOPY WITH REMOVAL OF ECTOPIC PREGNANCY N/A 01/27/2017   Procedure: DIAGNOSTIC LAPAROSCOPY WITH RIGHT SALPINGECTOMY FOR RIGHT ECTOPIC PREGNANCY;  Surgeon: Chancy Milroy, MD;  Location: [facility];  Service: Gynecology;  Laterality: N/A;  . WISDOM TOOTH EXTRACTION Bilateral     Family History  Problem Relation Age of Onset  . Breast cancer Paternal Grandmother   . Diabetes Paternal Grandfather   . Prostate cancer Paternal Grandfather   . Hypertension Father   . Heart disease Maternal Grandfather   . Cardiomyopathy Maternal Grandfather   . Healthy Sister        x 1  . Diabetes Sister 56       type II  . Hypertension Sister   . Heart attack Mother 90        deceased- also had lung disorder (? pulmonary fibrosis)  . Lung disease Mother   . Heart disease Maternal Aunt        heart disease  . Cardiomyopathy Maternal Aunt   . Heart disease Cousin   . Cardiomyopathy Maternal Aunt   . Cardiomyopathy Maternal Aunt     No Known Allergies  Current Outpatient Medications on File Prior to Visit  Medication Sig Dispense Refill  . FLUoxetine (PROZAC) 40 MG capsule TAKE 1 CAPSULE BY MOUTH EVERY DAY 90 capsule 0  . fluticasone (FLONASE) 50 MCG/ACT nasal spray Place 2 sprays into both nostrils daily. 16 g 6  . loratadine (CLARITIN) 10 MG tablet Take 1 tablet (10 mg total) by mouth daily. 30 tablet 11  . Multiple Vitamins-Minerals (MULTIVITAMIN WITH MINERALS) tablet Take 1 tablet by mouth daily.    Marland Kitchen olopatadine (PATANOL) 0.1 % ophthalmic solution Place 1 drop into both eyes 2 (two) times daily. 5 mL 5   No current facility-administered medications on file prior to visit.    BP 122/78 (BP Location: Left Arm, Patient Position: Sitting, Cuff Size: Large)   Pulse 70   Resp 16   SpO2 95%       Observations/Objective:   Gen: Awake, alert, no acute distress Resp: Breathing is even and non-labored Psych: calm/pleasant demeanor Neuro: Alert and Oriented x 3, + facial symmetry, speech is clear.   Assessment and Plan:  SOB- given hx of PE this is concerning. Will bring her into the office for EKG, urine HCG, CMET, CBC, TSH and a stat CT angio Chest to rule out PE.    Addendum:  Pt came in for EKG. EKG tracing is personally reviewed.  EKG notes NSR.  No acute changes.   CTA chest is performed and notes the following:  IMPRESSION: 1. No evidence of pulmonary embolus. 2. Small airspace opacity in the right lower lobe with an adjacent trace right-sided pleural effusion. Findings may represent atelectasis or pneumonia.  Reviewed with pt.  Will plan rx with azithromycin for PNA. She is advised to follow up in 1 week and to call sooner if symptoms  fail to improve. Pt verbalizes understanding.    Follow Up Instructions:    I discussed the assessment and treatment plan with the patient. The patient was provided an opportunity to ask questions and all were answered. The patient agreed with the plan and demonstrated an understanding of the instructions.   The patient was advised to call back or seek an in-person evaluation if the symptoms worsen or if the condition fails to improve as anticipated.  Artem Bunte  Rowe Pavy, NP

## 2020-10-01 LAB — COMPREHENSIVE METABOLIC PANEL
AG Ratio: 1.2 (calc) (ref 1.0–2.5)
ALT: 14 U/L (ref 6–29)
AST: 16 U/L (ref 10–30)
Albumin: 4.2 g/dL (ref 3.6–5.1)
Alkaline phosphatase (APISO): 71 U/L (ref 31–125)
BUN: 16 mg/dL (ref 7–25)
CO2: 25 mmol/L (ref 20–32)
Calcium: 9.8 mg/dL (ref 8.6–10.2)
Chloride: 104 mmol/L (ref 98–110)
Creat: 0.8 mg/dL (ref 0.50–1.10)
Globulin: 3.4 g/dL (calc) (ref 1.9–3.7)
Glucose, Bld: 93 mg/dL (ref 65–99)
Potassium: 3.9 mmol/L (ref 3.5–5.3)
Sodium: 139 mmol/L (ref 135–146)
Total Bilirubin: 0.6 mg/dL (ref 0.2–1.2)
Total Protein: 7.6 g/dL (ref 6.1–8.1)

## 2020-10-01 LAB — CBC WITH DIFFERENTIAL/PLATELET
Absolute Monocytes: 644 cells/uL (ref 200–950)
Basophils Absolute: 78 cells/uL (ref 0–200)
Basophils Relative: 0.9 %
Eosinophils Absolute: 479 cells/uL (ref 15–500)
Eosinophils Relative: 5.5 %
HCT: 42.7 % (ref 35.0–45.0)
Hemoglobin: 13.8 g/dL (ref 11.7–15.5)
Lymphs Abs: 1836 cells/uL (ref 850–3900)
MCH: 27.8 pg (ref 27.0–33.0)
MCHC: 32.3 g/dL (ref 32.0–36.0)
MCV: 86.1 fL (ref 80.0–100.0)
MPV: 13.3 fL — ABNORMAL HIGH (ref 7.5–12.5)
Monocytes Relative: 7.4 %
Neutro Abs: 5664 cells/uL (ref 1500–7800)
Neutrophils Relative %: 65.1 %
Platelets: 232 10*3/uL (ref 140–400)
RBC: 4.96 10*6/uL (ref 3.80–5.10)
RDW: 13.1 % (ref 11.0–15.0)
Total Lymphocyte: 21.1 %
WBC: 8.7 10*3/uL (ref 3.8–10.8)

## 2020-10-01 LAB — TSH: TSH: 1.79 mIU/L

## 2021-01-06 ENCOUNTER — Other Ambulatory Visit: Payer: Self-pay

## 2021-01-06 ENCOUNTER — Telehealth: Payer: Self-pay

## 2021-01-06 ENCOUNTER — Telehealth (INDEPENDENT_AMBULATORY_CARE_PROVIDER_SITE_OTHER): Payer: Managed Care, Other (non HMO) | Admitting: Family

## 2021-01-06 VITALS — Wt 221.0 lb

## 2021-01-06 DIAGNOSIS — K219 Gastro-esophageal reflux disease without esophagitis: Secondary | ICD-10-CM | POA: Diagnosis not present

## 2021-01-06 DIAGNOSIS — F418 Other specified anxiety disorders: Secondary | ICD-10-CM | POA: Diagnosis not present

## 2021-01-06 DIAGNOSIS — K5909 Other constipation: Secondary | ICD-10-CM

## 2021-01-06 MED ORDER — SERTRALINE HCL 50 MG PO TABS: ORAL_TABLET | ORAL | 0 refills | Status: DC

## 2021-01-06 MED ORDER — PANTOPRAZOLE SODIUM 40 MG PO TBEC: 40.0000 mg | DELAYED_RELEASE_TABLET | Freq: Every day | ORAL | 3 refills | Status: DC

## 2021-01-06 NOTE — Telephone Encounter (Signed)
Caller wanting to schedule an appt with Melissa.  Telephone: [phone]

## 2021-01-06 NOTE — Progress Notes (Signed)
Virtual Visit via Video Note  I connected with Dana Carpenter on 01/06/21 at  4:20 PM EST by a video enabled telemedicine application and verified that I am speaking with the correct person using two identifiers.  Location: Patient: home Provider: work   I discussed the limitations of evaluation and management by telemedicine and the availability of in person appointments. The patient expressed understanding and agreed to proceed. Only the patient and myself were present for today's video call.   History of Present Illness:  Patient is a 45 yr old female who presents today with several concerns:  GERD- wakes up with throat hurting and vomiting. Food feels like it is her chest.  tums do not help.  constipation, trying   Anxiety/depression- reports recurrent heart palpitations. Feels like she is easily agitated, works from home.  Feels like she is "getting depressed." She has been on prozac in the past.   Constipation- only goes once a week.  She has tried fiber in her drink without improvement.    Weight is 221lbs   Observations/Objective:   Gen: Awake, alert, no acute distress Resp: Breathing is even and non-labored Psych: calm/pleasant demeanor Neuro: Alert and Oriented x 3, + facial symmetry, speech is clear.   Assessment and Plan:  Anxiety/depression-  We will initiate zoloft 50mg . I instructed pt to start 1/2 tablet once daily for 1 week and then increase to a full tablet once daily on week two as tolerated.  We discussed common side effects such as nausea, drowsiness and weight gain.  Also discussed rare but serious side effect of suicide ideation.  She is instructed to discontinue medication go directly to ED if this occurs.  Pt verbalizes understanding.  Plan follow up in 1 month to evaluate progress.     GAD 7 : Generalized Anxiety Score 01/06/2021  Nervous, Anxious, on Edge 3  Control/stop worrying 3  Worry too much - different things 3  Trouble relaxing 3  Restless 1   Easily annoyed or irritable 3  Afraid - awful might happen 1  Total GAD 7 Score 17  Anxiety Difficulty Very difficult   Depression screen [facility] 2/9 01/06/2021 11/26/2019 04/23/2019 02/26/2019 02/17/2018  Decreased Interest 0 3 3 0 2  Down, Depressed, Hopeless 3 3 3 1 3   PHQ - 2 Score 3 6 6 1 5   Altered sleeping 3 3 3  - 3  Tired, decreased energy 3 3 3  - 3  Change in appetite 3 3 3  - 3  Feeling bad or failure about yourself  3 3 3  - 3  Trouble concentrating 3 0 3 - 3  Moving slowly or fidgety/restless 0 0 3 - 0  Suicidal thoughts 1 1 1  - 1  PHQ-9 Score 19 19 25  - 21  Difficult doing work/chores Somewhat difficult (No Data) Somewhat difficult - Somewhat difficult   GERD- uncontrolled. Discussed GERD dietary changes and addition of protonix 40mg  once daily.  Chronic constipation- advised pt as follows:  Drink 64+ oz of water a day and continue a high fiber diet. Add miralax 1 cap in 8 oz of fluid once daily.   Follow Up Instructions:    I discussed the assessment and treatment plan with the patient. The patient was provided an opportunity to ask questions and all were answered. The patient agreed with the plan and demonstrated an understanding of the instructions.   The patient was advised to call back or seek an in-person evaluation if the symptoms worsen or if  the condition fails to improve as anticipated.  Nance Pear, NP

## 2021-01-06 NOTE — Patient Instructions (Addendum)
Add protonix 40mg  once daily for reflux. Add zoloft 50mg  1/2 tab once daily for 1 week, then increase to a full tab once daily on week two. Call [facility] to schedule an appointment with a counselor: [phone]  Drink 64+ oz of water a day and continue a high fiber diet. Add miralax 1 cap in 8 oz of fluid once daily.    Food Choices for Gastroesophageal Reflux Disease, Adult When you have gastroesophageal reflux disease (GERD), the foods you eat and your eating habits are very important. Choosing the right foods can help ease your discomfort. Think about working with a food expert (dietitian) to help you make good choices. What are tips for following this plan? Reading food labels  Look for foods that are low in saturated fat. Foods that may help with your symptoms include: ? Foods that have less than 5% of daily value (DV) of fat. ? Foods that have 0 grams of trans fat. Cooking  Do not fry your food.  Cook your food by baking, steaming, grilling, or broiling. These are all methods that do not need a lot of fat for cooking.  To add flavor, try to use herbs that are low in spice and acidity. Meal planning  Choose healthy foods that are low in fat, such as: ? Fruits and vegetables. ? Whole grains. ? Low-fat dairy products. ? Lean meats, fish, and poultry.  Eat small meals often instead of eating 3 large meals each day. Eat your meals slowly in a place where you are relaxed. Avoid bending over or lying down until 2-3 hours after eating.  Limit high-fat foods such as fatty meats or fried foods.  Limit your intake of fatty foods, such as oils, butter, and shortening.  Avoid the following as told by your doctor: ? Foods that cause symptoms. These may be different for different people. Keep a food diary to keep track of foods that cause symptoms. ? Alcohol. ? Drinking a lot of liquid with meals. ? Eating meals during the 2-3 hours before bed.   Lifestyle  Stay  at a healthy weight. Ask your doctor what weight is healthy for you. If you need to lose weight, work with your doctor to do so safely.  Exercise for at least 30 minutes on 5 or more days each week, or as told by your doctor.  Wear loose-fitting clothes.  Do not smoke or use any products that contain nicotine or tobacco. If you need help quitting, ask your doctor.  Sleep with the head of your bed higher than your feet. Use a wedge under the mattress or blocks under the bed frame to raise the head of the bed.  Chew sugar-free gum after meals. What foods should eat? Eat a healthy, well-balanced diet of fruits, vegetables, whole grains, low-fat dairy products, lean meats, fish, and poultry. Each person is different. Foods that may cause symptoms in one person may not cause any symptoms in another person. Work with your doctor to find foods that are safe for you. The items listed above may not be a complete list of what you can eat and drink. Contact a food expert for more options.   What foods should I avoid? Limiting some of these foods may help in managing the symptoms of GERD. Everyone is different. Talk with a food expert or your doctor to help you find the exact foods to avoid, if any. Fruits Any fruits prepared with added fat. Any fruits that cause  symptoms. For some people, this may include citrus fruits, such as oranges, grapefruit, pineapple, and lemons. Vegetables Deep-fried vegetables. [place] fries. Any vegetables prepared with added fat. Any vegetables that cause symptoms. For some people, this may include tomatoes and tomato products, chili peppers, onions and garlic, and horseradish. Grains Pastries or quick breads with added fat. Meats and other proteins High-fat meats, such as fatty beef or pork, hot dogs, ribs, ham, sausage, salami, and bacon. Fried meat or protein, including fried fish and fried chicken. Nuts and nut butters, in large amounts. Dairy Whole milk and chocolate  milk. Sour cream. Cream. Ice cream. Cream cheese. Milkshakes. Fats and oils Butter. Margarine. Shortening. Ghee. Beverages Coffee and tea, with or without caffeine. Carbonated beverages. Sodas. Energy drinks. Fruit juice made with acidic fruits, such as orange or grapefruit. Tomato juice. Alcoholic drinks. Sweets and desserts Chocolate and cocoa. Donuts. Seasonings and condiments Pepper. Peppermint and spearmint. Added salt. Any condiments, herbs, or seasonings that cause symptoms. For some people, this may include curry, hot sauce, or vinegar-based salad dressings. The items listed above may not be a complete list of what you should not eat and drink. Contact a food expert for more options. Questions to ask your doctor Diet and lifestyle changes are often the first steps that are taken to manage symptoms of GERD. If diet and lifestyle changes do not help, talk with your doctor about taking medicines. Where to find more information  International Foundation for Gastrointestinal Disorders: aboutgerd.org Summary  When you have GERD, food and lifestyle choices are very important in easing your symptoms.  Eat small meals often instead of 3 large meals a day. Eat your meals slowly and in a place where you are relaxed.  Avoid bending over or lying down until 2-3 hours after eating.  Limit high-fat foods such as fatty meats or fried foods. This information is not intended to replace advice given to you by your health care provider. Make sure you discuss any questions you have with your health care provider. Document Revised: 06/02/2020 Document Reviewed: 06/02/2020 Elsevier Patient Education  Universal.

## 2021-01-07 ENCOUNTER — Other Ambulatory Visit: Payer: Self-pay | Admitting: Family

## 2021-01-07 NOTE — Telephone Encounter (Signed)
Pt seen yesterday.

## 2021-01-08 NOTE — Telephone Encounter (Signed)
Okay to refuse? Looks like pt is supposed to be taking Fluoxetine 40 mg daily.

## 2021-02-15 ENCOUNTER — Ambulatory Visit
Admission: EM | Admit: 2021-02-15 | Discharge: 2021-02-15 | Disposition: A | Discharge status: Home / Self Care | Payer: Managed Care, Other (non HMO) | Attending: Emergency Medicine | Admitting: Emergency Medicine

## 2021-02-15 ENCOUNTER — Encounter: Payer: Self-pay | Admitting: *Deleted

## 2021-02-15 ENCOUNTER — Other Ambulatory Visit: Payer: Self-pay

## 2021-02-15 DIAGNOSIS — J069 Acute upper respiratory infection, unspecified: Secondary | ICD-10-CM | POA: Diagnosis not present

## 2021-02-15 DIAGNOSIS — J111 Influenza due to unidentified influenza virus with other respiratory manifestations: Secondary | ICD-10-CM

## 2021-02-15 MED ORDER — BENZONATATE 100 MG PO CAPS
100.0000 mg | ORAL_CAPSULE | Freq: Three times a day (TID) | ORAL | 0 refills | Status: DC | PRN
Start: 2021-02-15 — End: 2021-02-17

## 2021-02-15 MED ORDER — KETOROLAC TROMETHAMINE 30 MG/ML IJ SOLN
30.0000 mg | Freq: Once | INTRAMUSCULAR | Status: AC
  Administered 2021-02-15: 30 mg via INTRAMUSCULAR

## 2021-02-15 NOTE — ED Provider Notes (Signed)
[facility]    CSN: [id number] Arrival date & time: 02/15/21  1215      History   Chief Complaint Chief Complaint  Patient presents with  . Fever  . Cough    HPI Dana Carpenter is a 45 y.o. female.   Dana Carpenter presents with complaints of fever, cough, congestion, body aches, headache which started 2-3 days ago. No gi symptoms. Occasional shortness of breath with exertion. No chest pain . Has taken ibuprofen and robitussin which have minimally helped. Hasn't taken any medications today. No skin rash. Son here today with same symptoms which started prior to her symptoms. No other known ill contacts. No previous covid infection. Home covid testing yesterday was negative. History of PE.     ROS per HPI, negative if not otherwise mentioned.      Past Medical History:  Diagnosis Date  . Anxiety   . Depression   . PE (pulmonary embolism) 03/26/2013   RT LOWER LOBE  . Strep pharyngitis     Patient Active Problem List   Diagnosis Date Noted  . Atypical squamous cells of undetermined significance (ASCUS) on Papanicolaou smear of cervix 10/17/2015  . Left knee pain 10/01/2015  . Preventative health care 02/13/2015  . Malar rash 11/27/2014  . Acne 11/27/2014  . Grief at loss of child 03/27/2014  . Abnormal uterine bleeding (AUB) 08/21/2013  . History of pulmonary embolism 03/26/2013    Past Surgical History:  Procedure Laterality Date  . DIAGNOSTIC LAPAROSCOPY WITH REMOVAL OF ECTOPIC PREGNANCY N/A 01/27/2017   Procedure: DIAGNOSTIC LAPAROSCOPY WITH RIGHT SALPINGECTOMY FOR RIGHT ECTOPIC PREGNANCY;  Surgeon: Chancy Milroy, MD;  Location: [facility];  Service: Gynecology;  Laterality: N/A;  . WISDOM TOOTH EXTRACTION Bilateral     OB History    Gravida  8   Para  4   Term  4   Preterm      AB  4   Living  4     SAB      IAB  2   Ectopic  2   Multiple      Live Births  4            Home Medications    Prior to Admission  medications   Medication Sig Start Date End Date Taking? Authorizing Provider  benzonatate (TESSALON) 100 MG capsule Take 1-2 capsules (100-200 mg total) by mouth 3 (three) times daily as needed for cough. 02/15/21  Yes Zigmund Gottron, NP  Multiple Vitamins-Minerals (MULTIVITAMIN WITH MINERALS) tablet Take 1 tablet by mouth daily.   Yes [provider]  pantoprazole (PROTONIX) 40 MG tablet Take 1 tablet (40 mg total) by mouth daily. 01/06/21  Yes [name], NP  sertraline (ZOLOFT) 50 MG tablet 1/2 tab by mouth once daily at bedtime for 1 week, then increase to a full tab once daily on week two 01/06/21  Yes [name], NP  fluticasone (FLONASE) 50 MCG/ACT nasal spray Place 2 sprays into both nostrils daily. 03/31/20   [name], NP  loratadine (CLARITIN) 10 MG tablet Take 1 tablet (10 mg total) by mouth daily. 03/31/20   [name], NP  olopatadine (PATANOL) 0.1 % ophthalmic solution Place 1 drop into both eyes 2 (two) times daily. 03/31/20   [name], NP    Family History Family History  Problem Relation Age of Onset  . Breast cancer Paternal Grandmother   . Diabetes Paternal Grandfather   . Prostate cancer Paternal Grandfather   .  Hypertension Father   . Heart disease Maternal Grandfather   . Cardiomyopathy Maternal Grandfather   . Healthy Sister        x 1  . Diabetes Sister 31       type II  . Hypertension Sister   . Heart attack Mother 77       deceased- also had lung disorder (? pulmonary fibrosis)  . Lung disease Mother   . Heart disease Maternal Aunt        heart disease  . Cardiomyopathy Maternal Aunt   . Heart disease Cousin   . Cardiomyopathy Maternal Aunt   . Cardiomyopathy Maternal Aunt     Social History Social History   Tobacco Use  . Smoking status: Never Smoker  . Smokeless tobacco: Never Used  . Tobacco comment: never used tobacco  Vaping Use  . Vaping Use: Never used  Substance Use Topics  . Alcohol  use: No    Alcohol/week: 0.0 standard drinks    Comment: occasional  . Drug use: No     Allergies   Patient has no known allergies.   Review of Systems Review of Systems   Physical Exam Triage Vital Signs ED Triage Vitals  Enc Vitals Group     BP 02/15/21 1226 106/73     Pulse Rate 02/15/21 1226 (!) 107     Resp 02/15/21 1226 18     Temp 02/15/21 1226 99.1 F (37.3 C)     Temp Source 02/15/21 1226 Oral     SpO2 02/15/21 1226 96 %     Weight --      Height --      Head Circumference --      Peak Flow --      Pain Score 02/15/21 1228 10     Pain Loc --      Pain Edu? --      Excl. in [place]? --    No data found.  Updated Vital Signs BP 106/73   Pulse (!) 107   Temp 99.1 F (37.3 C) (Oral)   Resp 18   LMP 02/15/2021 (Exact Date)   SpO2 96%   Visual Acuity Right Eye Distance:   Left Eye Distance:   Bilateral Distance:    Right Eye Near:   Left Eye Near:    Bilateral Near:     Physical Exam Constitutional:      General: She is not in acute distress.    Appearance: She is well-developed. She is ill-appearing.  HENT:     Right Ear: Tympanic membrane and ear canal normal.     Left Ear: Tympanic membrane and ear canal normal.     Nose: Nose normal.     Mouth/Throat:     Mouth: Mucous membranes are moist.  Cardiovascular:     Rate and Rhythm: Normal rate.  Pulmonary:     Effort: Pulmonary effort is normal.  Skin:    General: Skin is warm and dry.  Neurological:     Mental Status: She is alert and oriented to person, place, and time.      UC Treatments / Results  Labs (all labs ordered are listed, but only abnormal results are displayed) Labs Reviewed  COVID-19, FLU A+B NAA    EKG   Radiology No results found.  Procedures Procedures (including critical care time)  Medications Ordered in UC Medications  ketorolac (TORADOL) 30 MG/ML injection 30 mg (has no administration in time range)    Initial Impression /  Assessment and Plan / UC  Course  I have reviewed the triage vital signs and the nursing notes.  Pertinent labs & imaging results that were available during my care of the patient were reviewed by me and considered in my medical decision making (see chart for details).     Non toxic. Benign physical exam. History and physical consistent with viral illness. Covid and influenza testing pending and isolation instructions provided.  Supportive cares recommended. Return precautions provided. Patient verbalized understanding and agreeable to plan.   Final Clinical Impressions(s) / UC Diagnoses   Final diagnoses:  Acute upper respiratory infection  Influenza-like illness     Discharge Instructions     Self isolate until covid results are back.  We will notify you by phone if it is positive. Your negative results will be sent through your MyChart.    If it is positive you need to isolate from others for a total of 5 days. If no fever for 24 hours without medications, and symptoms improving you may end isolation on day 6, but wear a mask if around any others for an additional 5 days.   We have tested for influenza as well.  Push fluids to ensure adequate hydration and keep secretions thin.  Tylenol and/or ibuprofen as needed for pain or fevers.  Don't take ibuprofen for another 8 hours.  Over the counter medications such as dayquil/ nyquil or mucinex d to help with symptoms.  If symptoms worsen or do not improve in the next week to return to be seen or to follow up with your PCP.     ED Prescriptions    Medication Sig Dispense Auth. Provider   benzonatate (TESSALON) 100 MG capsule Take 1-2 capsules (100-200 mg total) by mouth 3 (three) times daily as needed for cough. 21 capsule Zigmund Gottron, NP     PDMP not reviewed this encounter.   Zigmund Gottron, NP 02/15/21 1301

## 2021-02-15 NOTE — ED Triage Notes (Signed)
C/O tactile fever, chills, cough, body aches, runny nose and nasal congestion.  Has been taking IBU and Robitussin.  States had negative Covid home test yesterday.

## 2021-02-15 NOTE — Discharge Instructions (Addendum)
Self isolate until covid results are back.  We will notify you by phone if it is positive. Your negative results will be sent through your MyChart.    If it is positive you need to isolate from others for a total of 5 days. If no fever for 24 hours without medications, and symptoms improving you may end isolation on day 6, but wear a mask if around any others for an additional 5 days.   We have tested for influenza as well.  Push fluids to ensure adequate hydration and keep secretions thin.  Tylenol and/or ibuprofen as needed for pain or fevers.  Don't take ibuprofen for another 8 hours.  Over the counter medications such as dayquil/ nyquil or mucinex d to help with symptoms.  If symptoms worsen or do not improve in the next week to return to be seen or to follow up with your PCP.

## 2021-02-16 LAB — COVID-19, FLU A+B NAA
Influenza A, NAA: NOT DETECTED
Influenza B, NAA: NOT DETECTED
SARS-CoV-2, NAA: NOT DETECTED

## 2021-02-17 ENCOUNTER — Telehealth (INDEPENDENT_AMBULATORY_CARE_PROVIDER_SITE_OTHER): Payer: Managed Care, Other (non HMO) | Admitting: Family

## 2021-02-17 ENCOUNTER — Other Ambulatory Visit: Payer: Self-pay

## 2021-02-17 DIAGNOSIS — J111 Influenza due to unidentified influenza virus with other respiratory manifestations: Secondary | ICD-10-CM

## 2021-02-17 DIAGNOSIS — F32A Depression, unspecified: Secondary | ICD-10-CM | POA: Diagnosis not present

## 2021-02-17 MED ORDER — SERTRALINE HCL 50 MG PO TABS: 50.0000 mg | ORAL_TABLET | Freq: Every day | ORAL | 0 refills | Status: DC

## 2021-02-17 NOTE — Progress Notes (Signed)
Virtual Visit via Video Note  I connected with Dana Carpenter on 02/17/21 at 10:20 AM EDT by a video enabled telemedicine application and verified that I am speaking with the correct person using two identifiers.  Location: Patient: home Provider: work   I discussed the limitations of evaluation and management by telemedicine and the availability of in person appointments. The patient expressed understanding and agreed to proceed. Only the patient and myself were present for today's video call.   History of Present Illness:  Patient is a 45 year old female who presents today with several symptoms.  Her symptoms include body aches, fever, diarrhea, nasal congestion, fatigue, and weakness.  She also reports anorexia.  Her symptoms began on 02/12/2021.  She went to the urgent care on 02/15/2021 where she tested negative for flu and for Covid.  Her son who had similar symptoms tested positive for the flu.  The patient has had Covid vaccination but did not have a flu shot this season.  She is using daytime and nighttime therafu and tylenol/ibuprofen.    Depression- reports mood is up and down on the zoloft. Reports that it is helping.  She has had some weight gain.  Reports today's weight was 229 Wt Readings from Last 3 Encounters:  01/06/21 221 lb (100.2 kg)  07/28/20 216 lb (98 kg)  03/31/20 212 lb 9.6 oz (96.4 kg)     Observations/Objective:   Gen: Awake, alert, no acute distress Resp: Breathing is even and non-labored Psych: calm/pleasant demeanor Neuro: Alert and Oriented x 3, + facial symmetry, speech is clear.   Assessment and Plan:  Influenza-history and exposure most consistent with influenza infection despite negative swab.  Advised patient on supportive measures including rest, hydration, management of pain/fever with Tylenol or Motrin.  Also advised use of Imodium as needed for diarrhea.  I did advise her not to use Tylenol with her TheraFlu blood products as they also  contain acetaminophen.  She is advised to let me know if she develops worsening cough, or if symptoms do not improve in the next 3 to 4 days.  She is outside of the window for Tamiflu unfortunately.  Depression-overall her depression symptoms seem to be improving, however I remain concerned about her weight gain.  She is motivated to stay on the Zoloft and not to have to discontinue due to further weight gain.  I have advised her to continue to monitor her weight closely let me know if she has any further weight gain.  Otherwise I would like to see her back no later than 3 months from today.  We will plan to reweigh at that time.  Follow Up Instructions:    I discussed the assessment and treatment plan with the patient. The patient was provided an opportunity to ask questions and all were answered. The patient agreed with the plan and demonstrated an understanding of the instructions.   The patient was advised to call back or seek an in-person evaluation if the symptoms worsen or if the condition fails to improve as anticipated.  Nance Pear, NP

## 2021-03-24 ENCOUNTER — Ambulatory Visit (INDEPENDENT_AMBULATORY_CARE_PROVIDER_SITE_OTHER): Payer: Managed Care, Other (non HMO) | Admitting: Bariatrics

## 2021-03-24 ENCOUNTER — Encounter (INDEPENDENT_AMBULATORY_CARE_PROVIDER_SITE_OTHER): Payer: Self-pay

## 2021-04-07 ENCOUNTER — Ambulatory Visit (INDEPENDENT_AMBULATORY_CARE_PROVIDER_SITE_OTHER): Payer: Managed Care, Other (non HMO) | Admitting: Bariatrics

## 2021-04-08 ENCOUNTER — Other Ambulatory Visit: Payer: Self-pay | Admitting: Family

## 2021-04-23 ENCOUNTER — Other Ambulatory Visit: Payer: Self-pay | Admitting: Family

## 2021-05-25 ENCOUNTER — Other Ambulatory Visit: Payer: Self-pay

## 2021-05-25 ENCOUNTER — Emergency Department (HOSPITAL_BASED_OUTPATIENT_CLINIC_OR_DEPARTMENT_OTHER)
Admission: EM | Admit: 2021-05-25 | Discharge: 2021-05-25 | Disposition: A | Discharge status: Home / Self Care | Payer: Managed Care, Other (non HMO) | Attending: Emergency Medicine | Admitting: Emergency Medicine

## 2021-05-25 ENCOUNTER — Encounter (HOSPITAL_BASED_OUTPATIENT_CLINIC_OR_DEPARTMENT_OTHER): Payer: Self-pay

## 2021-05-25 ENCOUNTER — Emergency Department (HOSPITAL_BASED_OUTPATIENT_CLINIC_OR_DEPARTMENT_OTHER): Payer: Managed Care, Other (non HMO)

## 2021-05-25 DIAGNOSIS — R319 Hematuria, unspecified: Secondary | ICD-10-CM | POA: Diagnosis present

## 2021-05-25 DIAGNOSIS — N390 Urinary tract infection, site not specified: Secondary | ICD-10-CM | POA: Diagnosis not present

## 2021-05-25 LAB — PREGNANCY, URINE: Preg Test, Ur: NEGATIVE

## 2021-05-25 LAB — URINALYSIS, ROUTINE W REFLEX MICROSCOPIC
Glucose, UA: NEGATIVE mg/dL
Ketones, ur: NEGATIVE mg/dL
Nitrite: POSITIVE — AB
Protein, ur: >300 mg/dL — AB
Specific Gravity, Urine: 1.025 (ref 1.005–1.030)
pH: 6.5 (ref 5.0–8.0)

## 2021-05-25 LAB — URINALYSIS, MICROSCOPIC (REFLEX)
RBC / HPF: >50 RBC/hpf (ref 0–5)
WBC, UA: >50 WBC/hpf (ref 0–5)

## 2021-05-25 MED ORDER — CEPHALEXIN 500 MG PO CAPS: 500.0000 mg | ORAL_CAPSULE | Freq: Four times a day (QID) | ORAL | 0 refills | Status: AC

## 2021-05-25 MED ORDER — HYDROCODONE-ACETAMINOPHEN 5-325 MG PO TABS
2.0000 | ORAL_TABLET | Freq: Once | ORAL | Status: AC
  Administered 2021-05-25: 16:00:00 2 via ORAL
  Filled 2021-05-25: qty 2

## 2021-05-25 MED ORDER — HYDROCODONE-ACETAMINOPHEN 5-325 MG PO TABS: 1.0000 | ORAL_TABLET | ORAL | 0 refills | Status: DC | PRN

## 2021-05-25 NOTE — ED Provider Notes (Signed)
[facility] Provider Note   CSN: [id number] Arrival date & time: 05/25/21  1342     History Chief Complaint  Patient presents with   Hematuria    Dana Carpenter is a 45 y.o. female.  The history is provided by the patient. No language interpreter was used.  Hematuria This is a new problem. The current episode started 6 to 12 hours ago. The problem occurs constantly. The problem has been gradually worsening. Associated symptoms include abdominal pain. Nothing aggravates the symptoms. Nothing relieves the symptoms. The treatment provided no relief.      Past Medical History:  Diagnosis Date   Anxiety    Depression    PE (pulmonary embolism) 03/26/2013   RT LOWER LOBE   Strep pharyngitis     Patient Active Problem List   Diagnosis Date Noted   Atypical squamous cells of undetermined significance (ASCUS) on Papanicolaou smear of cervix 10/17/2015   Left knee pain 10/01/2015   Preventative health care 02/13/2015   Malar rash 11/27/2014   Acne 11/27/2014   Grief at loss of child 03/27/2014   Abnormal uterine bleeding (AUB) 08/21/2013   History of pulmonary embolism 03/26/2013    Past Surgical History:  Procedure Laterality Date   DIAGNOSTIC LAPAROSCOPY WITH REMOVAL OF ECTOPIC PREGNANCY N/A 01/27/2017   Procedure: DIAGNOSTIC LAPAROSCOPY WITH RIGHT SALPINGECTOMY FOR RIGHT ECTOPIC PREGNANCY;  Surgeon: Chancy Milroy, MD;  Location: [facility];  Service: Gynecology;  Laterality: N/A;   WISDOM TOOTH EXTRACTION Bilateral      OB History     Gravida  8   Para  4   Term  4   Preterm      AB  4   Living  4      SAB      IAB  2   Ectopic  2   Multiple      Live Births  4           Family History  Problem Relation Age of Onset   Breast cancer Paternal Grandmother    Diabetes Paternal Grandfather    Prostate cancer Paternal Grandfather    Hypertension Father    Heart disease Maternal Grandfather    Cardiomyopathy  Maternal Grandfather    Healthy Sister        x 1   Diabetes Sister 37       type II   Hypertension Sister    Heart attack Mother 98       deceased- also had lung disorder (? pulmonary fibrosis)   Lung disease Mother    Heart disease Maternal Aunt        heart disease   Cardiomyopathy Maternal Aunt    Heart disease Cousin    Cardiomyopathy Maternal Aunt    Cardiomyopathy Maternal Aunt     Social History   Tobacco Use   Smoking status: Never   Smokeless tobacco: Never   Tobacco comments:    never used tobacco  Vaping Use   Vaping Use: Never used  Substance Use Topics   Alcohol use: No    Alcohol/week: 0.0 standard drinks    Comment: occasional   Drug use: No    Home Medications Prior to Admission medications   Medication Sig Start Date End Date Taking? Authorizing Provider  cephALEXin (KEFLEX) 500 MG capsule Take 1 capsule (500 mg total) by mouth 4 (four) times daily for 10 days. 05/25/21 06/04/21 Yes Fransico Meadow, PA-C  HYDROcodone-acetaminophen (NORCO/VICODIN) [phone]  MG tablet Take 1 tablet by mouth every 4 (four) hours as needed for moderate pain. 05/25/21 05/25/22 Yes Caryl Ada K, PA-C  benzonatate (TESSALON) 100 MG capsule Take by mouth 3 (three) times daily as needed for cough.    [provider]  fluticasone (FLONASE) 50 MCG/ACT nasal spray Place 2 sprays into both nostrils daily. 03/31/20   [name], NP  loratadine (CLARITIN) 10 MG tablet Take 1 tablet (10 mg total) by mouth daily. 03/31/20   [name], NP  Multiple Vitamins-Minerals (MULTIVITAMIN WITH MINERALS) tablet Take 1 tablet by mouth daily.    [provider]  olopatadine (PATANOL) 0.1 % ophthalmic solution Place 1 drop into both eyes 2 (two) times daily. 03/31/20   [name], NP  pantoprazole (PROTONIX) 40 MG tablet Take 1 tablet (40 mg total) by mouth daily. 01/06/21   [name], NP  sertraline (ZOLOFT) 50 MG tablet Take 1 tablet (50 mg total) by  mouth daily. 04/23/21   [name], NP    Allergies    Patient has no known allergies.  Review of Systems   Review of Systems  Gastrointestinal:  Positive for abdominal pain.  Genitourinary:  Positive for hematuria.  All other systems reviewed and are negative.  Physical Exam Updated Vital Signs BP (!) 126/103 (BP Location: Left Arm)   Pulse 89   Temp 98.2 F (36.8 C) (Oral)   Resp 18   Ht 5\' 6"  (1.676 m)   Wt 100.2 kg   LMP 05/13/2021   SpO2 98%   BMI 35.67 kg/m   Physical Exam Vitals and nursing note reviewed.  Constitutional:      Appearance: She is well-developed.  HENT:     Head: Normocephalic.     Mouth/Throat:     Mouth: Mucous membranes are moist.  Eyes:     Pupils: Pupils are equal, round, and reactive to light.  Cardiovascular:     Rate and Rhythm: Normal rate and regular rhythm.  Pulmonary:     Effort: Pulmonary effort is normal.  Abdominal:     General: There is no distension.  Musculoskeletal:        General: Normal range of motion.     Cervical back: Normal range of motion.  Skin:    General: Skin is warm.  Neurological:     Mental Status: She is alert and oriented to person, place, and time.  Psychiatric:        Mood and Affect: Mood normal.    ED Results / Procedures / Treatments   Labs (all labs ordered are listed, but only abnormal results are displayed) Labs Reviewed  URINALYSIS, ROUTINE W REFLEX MICROSCOPIC - Abnormal; Notable for the following components:      Result Value   Color, Urine BROWN (*)    APPearance TURBID (*)    Hgb urine dipstick LARGE (*)    Bilirubin Urine MODERATE (*)    Protein, ur >300 (*)    Nitrite POSITIVE (*)    Leukocytes,Ua MODERATE (*)    All other components within normal limits  URINALYSIS, MICROSCOPIC (REFLEX) - Abnormal; Notable for the following components:   Bacteria, UA MANY (*)    All other components within normal limits  URINE CULTURE  PREGNANCY, URINE     EKG None  Radiology CT Renal Stone Study  Result Date: 05/25/2021 CLINICAL DATA:  Hematuria, dysuria EXAM: CT ABDOMEN AND PELVIS WITHOUT CONTRAST TECHNIQUE: Multidetector CT imaging of the abdomen and pelvis was performed following the standard  protocol without IV contrast. COMPARISON:  None. FINDINGS: Lower chest: No acute abnormality. Hepatobiliary: No focal liver abnormality is seen. No gallstones, gallbladder wall thickening, or biliary dilatation. Pancreas: Unremarkable. Spleen: Unremarkable. Adrenals/Urinary Tract: Adrenals are unremarkable. There are no renal calculi. No hydronephrosis. Ureters are normal in caliber. Bladder is partially distended with circumferential wall thickening. Stomach/Bowel: Stomach is within normal limits. Bowel is normal in caliber. Normal appendix. Minimal sigmoid diverticulosis. Vascular/Lymphatic: No significant vascular abnormality on this noncontrast study. No enlarged lymph nodes. Reproductive: Uterus and bilateral adnexa are unremarkable. Other: No free fluid.  Abdominal wall is unremarkable. Musculoskeletal: No acute osseous abnormality. IMPRESSION: Bladder wall thickening, which may be on the basis of underdistention or cystitis. No urinary tract calculi or hydronephrosis. Minimal sigmoid diverticulosis. Electronically Signed   By: Macy Mis M.D.   On: 05/25/2021 16:13    Procedures Procedures   Medications Ordered in ED Medications  HYDROcodone-acetaminophen (NORCO/VICODIN) 5-325 MG per tablet 2 tablet (2 tablets Oral Given 05/25/21 1554)    ED Course  I have reviewed the triage vital signs and the nursing notes.  Pertinent labs & imaging results that were available during my care of the patient were reviewed by me and considered in my medical decision making (see chart for details).    MDM Rules/Calculators/A&P                          MDM:  Ua shaws greater than 50 wbc and rbc's  Ct scan is normal  Pt advised culture is pending.   Recheck with primary in 1 week  Final Clinical Impression(s) / ED Diagnoses Final diagnoses:  Urinary tract infection without hematuria, site unspecified  Hematuria, unspecified type    Rx / DC Orders ED Discharge Orders          Ordered    cephALEXin (KEFLEX) 500 MG capsule  4 times daily        05/25/21 1644    HYDROcodone-acetaminophen (NORCO/VICODIN) 5-325 MG tablet  Every 4 hours PRN        05/25/21 1644          An After Visit Summary was printed and given to the patient.    Fransico Meadow, [place] 05/25/21 1843    Gareth Morgan, MD 05/26/21 1435

## 2021-05-25 NOTE — ED Triage Notes (Signed)
Pt c/o hematuria, dysuria and freq-sx started yesterday-NAD-steady gait

## 2021-05-25 NOTE — Discharge Instructions (Signed)
Return if any problems.

## 2021-05-28 LAB — URINE CULTURE: Culture: >=100000 — AB

## 2021-05-29 ENCOUNTER — Telehealth: Payer: Self-pay | Admitting: *Deleted

## 2021-05-29 NOTE — Telephone Encounter (Signed)
Post ED Visit - Positive Culture Follow-up  Culture report reviewed by antimicrobial stewardship pharmacist: [facility] []  Elenor Quinones, Pharm.D. []  Heide Guile, Pharm.D., BCPS AQ-ID []  Parks Neptune, Pharm.D., BCPS []  Alycia Rossetti, Pharm.D., BCPS []  Jackson, Pharm.D., BCPS, AAHIVP []  Legrand Como, Pharm.D., BCPS, AAHIVP []  Salome Arnt, PharmD, BCPS []  Johnnette Gourd, PharmD, BCPS []  Hughes Better, PharmD, BCPS []  Leeroy Cha, PharmD []  Laqueta Linden, PharmD, BCPS []  Albertina Parr, PharmD  [place] Team []  Leodis Sias, PharmD []  Lindell Spar, PharmD []  Royetta Asal, PharmD []  Graylin Shiver, Rph []  Rema Fendt) Glennon Mac, PharmD []  Arlyn Dunning, PharmD []  Netta Cedars, PharmD []  Dia Sitter, PharmD []  Leone Haven, PharmD []  Gretta Arab, PharmD []  Theodis Shove, PharmD []  Peggyann Juba, PharmD []  Reuel Boom, PharmD   Positive urine culture Treated with Cephalexin, organism sensitive to the same and no further patient follow-up is required at this time.  Harlon Flor [facility] 05/29/2021, 11:06 AM

## 2021-06-28 ENCOUNTER — Other Ambulatory Visit: Payer: Self-pay | Admitting: Family

## 2021-07-08 ENCOUNTER — Other Ambulatory Visit: Payer: Self-pay

## 2021-07-08 ENCOUNTER — Ambulatory Visit (INDEPENDENT_AMBULATORY_CARE_PROVIDER_SITE_OTHER): Payer: Managed Care, Other (non HMO) | Admitting: Family

## 2021-07-08 ENCOUNTER — Other Ambulatory Visit: Payer: Self-pay | Admitting: Family

## 2021-07-08 VITALS — Wt 230.0 lb

## 2021-07-08 DIAGNOSIS — E669 Obesity, unspecified: Secondary | ICD-10-CM

## 2021-07-08 DIAGNOSIS — F32A Depression, unspecified: Secondary | ICD-10-CM

## 2021-07-08 HISTORY — DX: Obesity, unspecified: E66.9

## 2021-07-08 MED ORDER — PANTOPRAZOLE SODIUM 40 MG PO TBEC: 40.0000 mg | DELAYED_RELEASE_TABLET | Freq: Every day | ORAL | 1 refills | Status: DC

## 2021-07-08 MED ORDER — DULOXETINE HCL 30 MG PO CPEP: ORAL_CAPSULE | ORAL | 0 refills | Status: DC

## 2021-07-08 NOTE — Assessment & Plan Note (Signed)
Overall improved.  She is concerned about weight gain. We discussed transitioning to Cymbalta which is more weight neutral than sertraline. Pt is advised as follows:  Cut Zoloft in half and take 1/2 tab once daily for 1 week, then stop. Start cymbalta '30mg'$  once daily for 1 week, then increase to caps once daily.

## 2021-07-08 NOTE — Patient Instructions (Signed)
Cut Zoloft in half and take 1/2 tab once daily for 1 week, then stop. Start cymbalta '30mg'$  once daily for 1 week, then increase to caps once daily.

## 2021-07-08 NOTE — Assessment & Plan Note (Signed)
Recommended that she try increasing exercise from 3 days a week to 5 days a week. Recommended that she change bacon to [place] bacon or [place] sausage and cut back on late night snacking.

## 2021-07-08 NOTE — Progress Notes (Signed)
MyChart Video Visit    Virtual Visit via Video Note   This visit type was conducted due to national recommendations for restrictions regarding the COVID-19 Pandemic (e.g. social distancing) in an effort to limit this patient's exposure and mitigate transmission in our community. This patient is at least at moderate risk for complications without adequate follow up. This format is felt to be most appropriate for this patient at this time. Physical exam was limited by quality of the video and audio technology used for the visit. CMA was able to get the patient set up on a video visit.  Patient location: Home Patient and provider in visit Provider location: Office  I discussed the limitations of evaluation and management by telemedicine and the availability of in person appointments. The patient expressed understanding and agreed to proceed.  Visit Date: 07/08/2021  Today's healthcare provider: Nance Pear, NP     Subjective:    Patient ID: Dana Carpenter, female    DOB: 1976-04-12, 45 y.o.   MRN: [id number]  Chief Complaint  Patient presents with   Depression    HPI Patient is in today for a video visit.   Depression- She continues Sertraline '50mg'$  once daily. She tells me that overall her depression symptoms have been better. She is working on her diet and exercise.  She is walking 3 days a week.  Despite these efforts she has continued to gain more weight.   Wt Readings from Last 3 Encounters:  07/08/21 230 lb (104.3 kg)  05/25/21 221 lb (100.2 kg)  01/06/21 221 lb (100.2 kg)   Reports a typical diet:  2 eggs/2 pieces of bacon Grilled chicken/salad- apple cider vinegar Makes dinner at home (spinach brussel sprouts- chicken or steak) tries to avoid carb with dinner  Trying to eat more fruit Water and diet pepsi (1 a day) She does late night snacking.    Past Medical History:  Diagnosis Date   Anxiety    Depression    PE (pulmonary embolism) 03/26/2013    RT LOWER LOBE   Strep pharyngitis     Past Surgical History:  Procedure Laterality Date   DIAGNOSTIC LAPAROSCOPY WITH REMOVAL OF ECTOPIC PREGNANCY N/A 01/27/2017   Procedure: DIAGNOSTIC LAPAROSCOPY WITH RIGHT SALPINGECTOMY FOR RIGHT ECTOPIC PREGNANCY;  Surgeon: Chancy Milroy, MD;  Location: [facility];  Service: Gynecology;  Laterality: N/A;   WISDOM TOOTH EXTRACTION Bilateral     Family History  Problem Relation Age of Onset   Breast cancer Paternal Grandmother    Diabetes Paternal Grandfather    Prostate cancer Paternal Grandfather    Hypertension Father    Heart disease Maternal Grandfather    Cardiomyopathy Maternal Grandfather    Healthy Sister        x 1   Diabetes Sister 36       type II   Hypertension Sister    Heart attack Mother 37       deceased- also had lung disorder (? pulmonary fibrosis)   Lung disease Mother    Heart disease Maternal Aunt        heart disease   Cardiomyopathy Maternal Aunt    Heart disease Cousin    Cardiomyopathy Maternal Aunt    Cardiomyopathy Maternal Aunt     Social History   Socioeconomic History   Marital status: Single    Spouse name: Not on file   Number of children: Not on file   Years of education: Not on file  Highest education level: Not on file  Occupational History   Not on file  Tobacco Use   Smoking status: Never   Smokeless tobacco: Never   Tobacco comments:    never used tobacco  Vaping Use   Vaping Use: Never used  Substance and Sexual Activity   Alcohol use: No    Alcohol/week: 0.0 standard drinks    Comment: occasional   Drug use: No   Sexual activity: Not on file  Other Topics Concern   Not on file  Social History Narrative   Lives with 2 sons [name] 2008, [name] 2011   Older son born 11- in and out of the home   Boyfriend, but single   Works in [employer]   Enjoys sleeping.   Completed some college   Sister lives locally, most of her family in [place] and has 2 aunts in  [employer]useum/gallery conserv[place]   Social Determinants of Health   Financial Resource Strain: Not on file  Food Insecurity: Not on file  Transportation Needs: Not on file  Physical Activity: Not on file  Stress: Not on file  Social Connections: Not on file  Intimate Partner Violence: Not on file    Outpatient Medications Prior to Visit  Medication Sig Dispense Refill   pantoprazole (PROTONIX) 40 MG tablet Take 1 tablet (40 mg total) by mouth daily. 30 tablet 3   sertraline (ZOLOFT) 50 MG tablet Take 1 tablet (50 mg total) by mouth daily. 90 tablet 1   benzonatate (TESSALON) 100 MG capsule Take by mouth 3 (three) times daily as needed for cough.     fluticasone (FLONASE) 50 MCG/ACT nasal spray Place 2 sprays into both nostrils daily. 16 g 6   HYDROcodone-acetaminophen (NORCO/VICODIN) 5-325 MG tablet Take 1 tablet by mouth every 4 (four) hours as needed for moderate pain. 10 tablet 0   loratadine (CLARITIN) 10 MG tablet Take 1 tablet (10 mg total) by mouth daily. 30 tablet 11   Multiple Vitamins-Minerals (MULTIVITAMIN WITH MINERALS) tablet Take 1 tablet by mouth daily.     olopatadine (PATANOL) 0.1 % ophthalmic solution Place 1 drop into both eyes 2 (two) times daily. 5 mL 5   No facility-administered medications prior to visit.    No Known Allergies  ROS     Objective:    Physical Exam  Wt 230 lb (104.3 kg)   BMI 37.12 kg/m  Wt Readings from Last 3 Encounters:  07/08/21 230 lb (104.3 kg)  05/25/21 221 lb (100.2 kg)  01/06/21 221 lb (100.2 kg)    Diabetic Foot Exam - Simple   No data filed    Lab Results  Component Value Date   WBC 8.7 09/30/2020   HGB 13.8 09/30/2020   HCT 42.7 09/30/2020   PLT 232 09/30/2020   GLUCOSE 93 09/30/2020   CHOL 156 03/31/2020   TRIG 68.0 03/31/2020   HDL 39.90 03/31/2020   LDLCALC 102 (H) 03/31/2020   ALT 14 09/30/2020   AST 16 09/30/2020   NA 139 09/30/2020   K 3.9 09/30/2020   CL 104 09/30/2020   CREATININE 0.80 09/30/2020   BUN 16 09/30/2020    CO2 25 09/30/2020   TSH 1.79 09/30/2020   HGBA1C 5.4 02/11/2015    Lab Results  Component Value Date   TSH 1.79 09/30/2020   Lab Results  Component Value Date   WBC 8.7 09/30/2020   HGB 13.8 09/30/2020   HCT 42.7 09/30/2020   MCV 86.1 09/30/2020   PLT 232  09/30/2020   Lab Results  Component Value Date   NA 139 09/30/2020   K 3.9 09/30/2020   CO2 25 09/30/2020   GLUCOSE 93 09/30/2020   BUN 16 09/30/2020   CREATININE 0.80 09/30/2020   BILITOT 0.6 09/30/2020   ALKPHOS 56 03/31/2020   AST 16 09/30/2020   ALT 14 09/30/2020   PROT 7.6 09/30/2020   ALBUMIN 3.8 03/31/2020   CALCIUM 9.8 09/30/2020   ANIONGAP 3 (L) 12/29/2014   GFR 86.85 03/31/2020   Lab Results  Component Value Date   CHOL 156 03/31/2020   Lab Results  Component Value Date   HDL 39.90 03/31/2020   Lab Results  Component Value Date   LDLCALC 102 (H) 03/31/2020   Lab Results  Component Value Date   TRIG 68.0 03/31/2020   Lab Results  Component Value Date   CHOLHDL 4 03/31/2020   Lab Results  Component Value Date   HGBA1C 5.4 02/11/2015    Gen: Awake, alert, no acute distress Resp: Breathing is even and non-labored Psych: calm/pleasant demeanor Neuro: Alert and Oriented x 3, + facial symmetry, speech is clear.     Assessment & Plan:   Problem List Items Addressed This Visit       Unprioritized   Obesity (BMI 30-39.9)    Recommended that she try increasing exercise from 3 days a week to 5 days a week. Recommended that she change bacon to [place] bacon or [place] sausage and cut back on late night snacking.        Depression - Primary    Overall improved.  She is concerned about weight gain. We discussed transitioning to Cymbalta which is more weight neutral than sertraline. Pt is advised as follows:  Cut Zoloft in half and take 1/2 tab once daily for 1 week, then stop. Start cymbalta '30mg'$  once daily for 1 week, then increase to caps once daily.        Relevant Medications    DULoxetine (CYMBALTA) 30 MG capsule     Meds ordered this encounter  Medications   DULoxetine (CYMBALTA) 30 MG capsule    Sig: Take 1 cap by mouth once daily for 1 week, then increase to a full tab once daily on week two.    Dispense:  60 capsule    Refill:  0    Order Specific Question:   Supervising Provider    Answer:   Penni Homans A [[id number]]   pantoprazole (PROTONIX) 40 MG tablet    Sig: Take 1 tablet (40 mg total) by mouth daily.    Dispense:  90 tablet    Refill:  1    Order Specific Question:   Supervising Provider    Answer:   Penni Homans A [[id number]]    I discussed the assessment and treatment plan with the patient. The patient was provided an opportunity to ask questions and all were answered. The patient agreed with the plan and demonstrated an understanding of the instructions.   The patient was advised to call back or seek an in-person evaluation if the symptoms worsen or if the condition fails to improve as anticipated.  I provided 20 minutes of face-to-face time during this encounter.   Nance Pear, NP [facility] [phone] (phone) [phone] (fax)  [place]

## 2021-08-01 ENCOUNTER — Other Ambulatory Visit: Payer: Self-pay | Admitting: Family

## 2021-08-04 NOTE — Telephone Encounter (Signed)
Patient advised of refill and scheduled to be seen on virtual visit  08-18-21

## 2021-08-04 NOTE — Telephone Encounter (Signed)
Let's bring her back in the next 1-2 weeks please.

## 2021-08-18 ENCOUNTER — Other Ambulatory Visit: Payer: Self-pay | Admitting: Family

## 2021-08-18 ENCOUNTER — Telehealth: Payer: Self-pay | Admitting: Family

## 2021-08-18 ENCOUNTER — Other Ambulatory Visit: Payer: Self-pay

## 2021-08-18 ENCOUNTER — Other Ambulatory Visit (HOSPITAL_BASED_OUTPATIENT_CLINIC_OR_DEPARTMENT_OTHER): Payer: Self-pay

## 2021-08-18 ENCOUNTER — Telehealth (INDEPENDENT_AMBULATORY_CARE_PROVIDER_SITE_OTHER): Payer: Managed Care, Other (non HMO) | Admitting: Family

## 2021-08-18 DIAGNOSIS — J019 Acute sinusitis, unspecified: Secondary | ICD-10-CM

## 2021-08-18 DIAGNOSIS — F32A Depression, unspecified: Secondary | ICD-10-CM

## 2021-08-18 DIAGNOSIS — E669 Obesity, unspecified: Secondary | ICD-10-CM | POA: Diagnosis not present

## 2021-08-18 MED ORDER — FLUCONAZOLE 150 MG PO TABS: ORAL_TABLET | ORAL | 0 refills | Status: DC

## 2021-08-18 MED ORDER — FLUCONAZOLE 150 MG PO TABS
ORAL_TABLET | ORAL | 0 refills | Status: DC
  Filled 2021-08-18: qty 1, 1d supply, fill #0

## 2021-08-18 MED ORDER — AMOXICILLIN-POT CLAVULANATE 875-125 MG PO TABS: 1.0000 | ORAL_TABLET | Freq: Two times a day (BID) | ORAL | 0 refills | Status: DC

## 2021-08-18 MED ORDER — DULOXETINE HCL 60 MG PO CPEP: 60.0000 mg | ORAL_CAPSULE | Freq: Every day | ORAL | 1 refills | Status: DC

## 2021-08-18 MED ORDER — AMOXICILLIN-POT CLAVULANATE 875-125 MG PO TABS
1.0000 | ORAL_TABLET | Freq: Two times a day (BID) | ORAL | 0 refills | Status: DC
  Filled 2021-08-18: qty 20, 10d supply, fill #0

## 2021-08-18 NOTE — Telephone Encounter (Signed)
Augmentin on back order at [facility]. It is instock at the [facility]. Rx's resent downstairs and pt is aware.

## 2021-08-18 NOTE — Assessment & Plan Note (Signed)
Stable with transition to cymbalta '60mg'$  once daily. Continue same.

## 2021-08-18 NOTE — Assessment & Plan Note (Signed)
New. Will rx with augmentin bid x 10 days.  She is also given diflucan as needed if she develops yeast infection.

## 2021-08-18 NOTE — Telephone Encounter (Signed)
This medication is:   Pharmacy comment: Product Backordered/Unavailable.

## 2021-08-18 NOTE — Progress Notes (Signed)
MyChart Video Visit    Virtual Visit via Video Note   This visit type was conducted due to national recommendations for restrictions regarding the COVID-19 Pandemic (e.g. social distancing) in an effort to limit this patient's exposure and mitigate transmission in our community. This patient is at least at moderate risk for complications without adequate follow up. This format is felt to be most appropriate for this patient at this time. Physical exam was limited by quality of the video and audio technology used for the visit. CMA was able to get the patient set up on a video visit.  Patient location: Home Patient and provider in visit Provider location: Office  I discussed the limitations of evaluation and management by telemedicine and the availability of in person appointments. The patient expressed understanding and agreed to proceed.  Visit Date: 08/18/2021  Today's healthcare provider: Nance Pear, NP     Subjective:    Patient ID: Dana Carpenter, female    DOB: 08/02/1976, 45 y.o.   MRN: [id number]  Chief Complaint  Patient presents with   Depression    Follow up, "doing better on medication"   Nasal Congestion    Complains of nasal congestion with sinus headache and pressure.     HPI Patient is in today for a video visit.  Weight gain: She expressed concern during her last visit regarding her weight gain despite eating healthy and exercising. She was switched from 50 mg Zoloft to 60 mg Cymbalta to try and combat weight gain without reducing psychological improvements. She is interested in joining the healthy living program that we offer.  Depression: She denies any symptom setbacks with recent medication change from 50 mg Zoloft to 60 mg Cymbalta. She is currently sleeping better so has stopped usage of benadryl to sleep.  Exercise: She currently walks 4 times a week for at least 1 hour while her son is at football practice.  Diet: She is not currently  counting calories but is maintaining a healthy diet.  Sinus Issue: She has been experiencing sinus infection symptoms which include pressure, congestion, and not feeling normal. This has been ongoing for a week.   Past Medical History:  Diagnosis Date   Anxiety    Depression    PE (pulmonary embolism) 03/26/2013   RT LOWER LOBE   Strep pharyngitis     Past Surgical History:  Procedure Laterality Date   DIAGNOSTIC LAPAROSCOPY WITH REMOVAL OF ECTOPIC PREGNANCY N/A 01/27/2017   Procedure: DIAGNOSTIC LAPAROSCOPY WITH RIGHT SALPINGECTOMY FOR RIGHT ECTOPIC PREGNANCY;  Surgeon: Chancy Milroy, MD;  Location: [facility];  Service: Gynecology;  Laterality: N/A;   WISDOM TOOTH EXTRACTION Bilateral     Family History  Problem Relation Age of Onset   Breast cancer Paternal Grandmother    Diabetes Paternal Grandfather    Prostate cancer Paternal Grandfather    Hypertension Father    Heart disease Maternal Grandfather    Cardiomyopathy Maternal Grandfather    Healthy Sister        x 1   Diabetes Sister 71       type II   Hypertension Sister    Heart attack Mother 74       deceased- also had lung disorder (? pulmonary fibrosis)   Lung disease Mother    Heart disease Maternal Aunt        heart disease   Cardiomyopathy Maternal Aunt    Heart disease Cousin    Cardiomyopathy Maternal Aunt  Cardiomyopathy Maternal Aunt     Social History   Socioeconomic History   Marital status: Single    Spouse name: Not on file   Number of children: Not on file   Years of education: Not on file   Highest education level: Not on file  Occupational History   Not on file  Tobacco Use   Smoking status: Never   Smokeless tobacco: Never   Tobacco comments:    never used tobacco  Vaping Use   Vaping Use: Never used  Substance and Sexual Activity   Alcohol use: No    Alcohol/week: 0.0 standard drinks    Comment: occasional   Drug use: No   Sexual activity: Not on file  Other Topics Concern    Not on file  Social History Narrative   Lives with 2 sons [name] 2008, [name] 2011   Older son born 71- in and out of the home   Boyfriend, but single   Works in [employer]   Enjoys sleeping.   Completed some college   Sister lives locally, most of her family in [place] and has 2 aunts in [employer]gallery [employer]   Social Determinants of Health   Financial Resource Strain: Not on file  Food Insecurity: Not on file  Transportation Needs: Not on file  Physical Activity: Not on file  Stress: Not on file  Social Connections: Not on file  Intimate Partner Violence: Not on file    Outpatient Medications Prior to Visit  Medication Sig Dispense Refill   pantoprazole (PROTONIX) 40 MG tablet Take 1 tablet (40 mg total) by mouth daily. 90 tablet 1   DULoxetine (CYMBALTA) 30 MG capsule Take 2 capsules (60 mg total) by mouth daily. 60 capsule 0   No facility-administered medications prior to visit.    No Known Allergies  Review of Systems  HENT:  Positive for congestion, ear pain, sinus pain and sore throat.       Objective:    Physical Exam Constitutional:      General: She is not in acute distress.    Appearance: Normal appearance. She is not ill-appearing.  HENT:     Head: Normocephalic and atraumatic.     Right Ear: External ear normal.     Left Ear: External ear normal.  Cardiovascular:     Rate and Rhythm: Normal rate and regular rhythm.  Neurological:     Mental Status: She is alert.  Psychiatric:        Behavior: Behavior normal.        Judgment: Judgment normal.    There were no vitals taken for this visit. Wt Readings from Last 3 Encounters:  07/08/21 230 lb (104.3 kg)  05/25/21 221 lb (100.2 kg)  01/06/21 221 lb (100.2 kg)    Diabetic Foot Exam - Simple   No data filed    Lab Results  Component Value Date   WBC 8.7 09/30/2020   HGB 13.8 09/30/2020   HCT 42.7 09/30/2020   PLT 232 09/30/2020   GLUCOSE 93 09/30/2020   CHOL 156 03/31/2020    TRIG 68.0 03/31/2020   HDL 39.90 03/31/2020   LDLCALC 102 (H) 03/31/2020   ALT 14 09/30/2020   AST 16 09/30/2020   NA 139 09/30/2020   K 3.9 09/30/2020   CL 104 09/30/2020   CREATININE 0.80 09/30/2020   BUN 16 09/30/2020   CO2 25 09/30/2020   TSH 1.79 09/30/2020   HGBA1C 5.4 02/11/2015    Lab Results  Component Value Date   TSH 1.79 09/30/2020   Lab Results  Component Value Date   WBC 8.7 09/30/2020   HGB 13.8 09/30/2020   HCT 42.7 09/30/2020   MCV 86.1 09/30/2020   PLT 232 09/30/2020   Lab Results  Component Value Date   NA 139 09/30/2020   K 3.9 09/30/2020   CO2 25 09/30/2020   GLUCOSE 93 09/30/2020   BUN 16 09/30/2020   CREATININE 0.80 09/30/2020   BILITOT 0.6 09/30/2020   ALKPHOS 56 03/31/2020   AST 16 09/30/2020   ALT 14 09/30/2020   PROT 7.6 09/30/2020   ALBUMIN 3.8 03/31/2020   CALCIUM 9.8 09/30/2020   ANIONGAP 3 (L) 12/29/2014   GFR 86.85 03/31/2020   Lab Results  Component Value Date   CHOL 156 03/31/2020   Lab Results  Component Value Date   HDL 39.90 03/31/2020   Lab Results  Component Value Date   LDLCALC 102 (H) 03/31/2020   Lab Results  Component Value Date   TRIG 68.0 03/31/2020   Lab Results  Component Value Date   CHOLHDL 4 03/31/2020   Lab Results  Component Value Date   HGBA1C 5.4 02/11/2015       Assessment & Plan:   Problem List Items Addressed This Visit       Unprioritized   Obesity (BMI 30-39.9) - Primary    Pt is frustrated with lack of weight loss. Recommended referral to Medical Weight management.  She is agreeable.       Relevant Orders   Amb Ref to Medical Weight Management   Depression    Stable with transition to cymbalta '60mg'$  once daily. Continue same.       Relevant Medications   DULoxetine (CYMBALTA) 60 MG capsule   Acute sinusitis    New. Will rx with augmentin bid x 10 days.  She is also given diflucan as needed if she develops yeast infection.       Relevant Medications    amoxicillin-clavulanate (AUGMENTIN) 875-125 MG tablet   fluconazole (DIFLUCAN) 150 MG tablet    Meds ordered this encounter  Medications   DULoxetine (CYMBALTA) 60 MG capsule    Sig: Take 1 capsule (60 mg total) by mouth daily.    Dispense:  90 capsule    Refill:  1    Order Specific Question:   Supervising Provider    Answer:   Penni Homans A [[id number]]   amoxicillin-clavulanate (AUGMENTIN) 875-125 MG tablet    Sig: Take 1 tablet by mouth 2 (two) times daily.    Dispense:  20 tablet    Refill:  0    Order Specific Question:   Supervising Provider    Answer:   Penni Homans A [[id number]]   fluconazole (DIFLUCAN) 150 MG tablet    Sig: Take 1 tablet by mouth as needed for yeast infection.    Dispense:  1 tablet    Refill:  0    Order Specific Question:   Supervising Provider    Answer:   Penni Homans A [[id number]]    I discussed the assessment and treatment plan with the patient. The patient was provided an opportunity to ask questions and all were answered. The patient agreed with the plan and demonstrated an understanding of the instructions.   The patient was advised to call back or seek an in-person evaluation if the symptoms worsen or if the condition fails to improve as anticipated.   I,Lyric Barr-McArthur,acting as a Education administrator for [employer]  Inda Castle, NP.,have documented all relevant documentation on the behalf of Nance Pear, NP,as directed by  Nance Pear, NP while in the presence of Nance Pear, NP.   I provided 20 minutes of face-to-face time during this encounter.   Nance Pear, NP [facility] [phone] (phone) [phone] (fax)  [place]

## 2021-08-18 NOTE — Assessment & Plan Note (Signed)
Pt is frustrated with lack of weight loss. Recommended referral to Medical Weight management.  She is agreeable.

## 2022-01-03 ENCOUNTER — Other Ambulatory Visit: Payer: Self-pay | Admitting: Family

## 2022-01-13 ENCOUNTER — Ambulatory Visit (HOSPITAL_BASED_OUTPATIENT_CLINIC_OR_DEPARTMENT_OTHER): Admission: RE | Admit: 2022-01-13 | Payer: Managed Care, Other (non HMO) | Source: Ambulatory Visit

## 2022-01-13 ENCOUNTER — Other Ambulatory Visit (HOSPITAL_BASED_OUTPATIENT_CLINIC_OR_DEPARTMENT_OTHER): Payer: Self-pay

## 2022-01-13 ENCOUNTER — Ambulatory Visit (INDEPENDENT_AMBULATORY_CARE_PROVIDER_SITE_OTHER): Payer: Managed Care, Other (non HMO) | Admitting: Family

## 2022-01-13 ENCOUNTER — Encounter: Payer: Self-pay | Admitting: Family

## 2022-01-13 VITALS — BP 119/64 | HR 70 | Temp 98.5°F | Resp 16 | Ht 66.0 in | Wt 240.0 lb

## 2022-01-13 DIAGNOSIS — G4700 Insomnia, unspecified: Secondary | ICD-10-CM

## 2022-01-13 DIAGNOSIS — K5909 Other constipation: Secondary | ICD-10-CM

## 2022-01-13 DIAGNOSIS — R635 Abnormal weight gain: Secondary | ICD-10-CM | POA: Diagnosis not present

## 2022-01-13 DIAGNOSIS — G43909 Migraine, unspecified, not intractable, without status migrainosus: Secondary | ICD-10-CM | POA: Insufficient documentation

## 2022-01-13 DIAGNOSIS — Z23 Encounter for immunization: Secondary | ICD-10-CM

## 2022-01-13 DIAGNOSIS — G43809 Other migraine, not intractable, without status migrainosus: Secondary | ICD-10-CM | POA: Diagnosis not present

## 2022-01-13 DIAGNOSIS — N946 Dysmenorrhea, unspecified: Secondary | ICD-10-CM | POA: Insufficient documentation

## 2022-01-13 DIAGNOSIS — M7989 Other specified soft tissue disorders: Secondary | ICD-10-CM

## 2022-01-13 DIAGNOSIS — J3489 Other specified disorders of nose and nasal sinuses: Secondary | ICD-10-CM | POA: Insufficient documentation

## 2022-01-13 DIAGNOSIS — E669 Obesity, unspecified: Secondary | ICD-10-CM

## 2022-01-13 DIAGNOSIS — Z0001 Encounter for general adult medical examination with abnormal findings: Secondary | ICD-10-CM | POA: Diagnosis not present

## 2022-01-13 DIAGNOSIS — Z1211 Encounter for screening for malignant neoplasm of colon: Secondary | ICD-10-CM

## 2022-01-13 HISTORY — DX: Insomnia, unspecified: G47.00

## 2022-01-13 HISTORY — DX: Migraine, unspecified, not intractable, without status migrainosus: G43.909

## 2022-01-13 HISTORY — DX: Other specified soft tissue disorders: M79.89

## 2022-01-13 HISTORY — DX: Other constipation: K59.09

## 2022-01-13 HISTORY — DX: Other specified disorders of nose and nasal sinuses: J34.89

## 2022-01-13 LAB — LIPID PANEL
Cholesterol: 150 mg/dL (ref 0–200)
HDL: 47.4 mg/dL (ref >39.00)
LDL Cholesterol: 88 mg/dL (ref 0–99)
NonHDL: 102.78
Total CHOL/HDL Ratio: 3
Triglycerides: 72 mg/dL (ref 0.0–149.0)
VLDL: 14.4 mg/dL (ref 0.0–40.0)

## 2022-01-13 LAB — COMPREHENSIVE METABOLIC PANEL
ALT: 15 U/L (ref 0–35)
AST: 16 U/L (ref 0–37)
Albumin: 3.7 g/dL (ref 3.5–5.2)
Alkaline Phosphatase: 60 U/L (ref 39–117)
BUN: 14 mg/dL (ref 6–23)
CO2: 29 mEq/L (ref 19–32)
Calcium: 9.1 mg/dL (ref 8.4–10.5)
Chloride: 105 mEq/L (ref 96–112)
Creatinine, Ser: 1 mg/dL (ref 0.40–1.20)
GFR: 68.04 mL/min (ref >60.00)
Glucose, Bld: 94 mg/dL (ref 70–99)
Potassium: 4.4 mEq/L (ref 3.5–5.1)
Sodium: 139 mEq/L (ref 135–145)
Total Bilirubin: 0.6 mg/dL (ref 0.2–1.2)
Total Protein: 6.8 g/dL (ref 6.0–8.3)

## 2022-01-13 LAB — TSH: TSH: 1.45 u[IU]/mL (ref 0.35–5.50)

## 2022-01-13 MED ORDER — KETOROLAC TROMETHAMINE 60 MG/2ML IM SOLN
60.0000 mg | Freq: Once | INTRAMUSCULAR | Status: AC
  Administered 2022-01-13: 60 mg via INTRAMUSCULAR

## 2022-01-13 MED ORDER — HYDROXYZINE PAMOATE 25 MG PO CAPS
25.0000 mg | ORAL_CAPSULE | Freq: Every evening | ORAL | 3 refills | Status: DC | PRN
  Filled 2022-01-13: qty 30, 30d supply, fill #0
  Filled 2022-04-23: qty 30, 30d supply, fill #1
  Filled 2022-07-14 – 2022-09-10 (×2): qty 30, 30d supply, fill #2
  Filled 2022-12-28: qty 30, 30d supply, fill #3

## 2022-01-13 MED ORDER — ONDANSETRON HCL 4 MG PO TABS
4.0000 mg | ORAL_TABLET | Freq: Three times a day (TID) | ORAL | 0 refills | Status: DC | PRN
  Filled 2022-01-13: qty 9, 3d supply, fill #0
  Filled 2022-01-13: qty 20, 7d supply, fill #0

## 2022-01-13 MED ORDER — SUMATRIPTAN SUCCINATE 50 MG PO TABS
50.0000 mg | ORAL_TABLET | ORAL | 0 refills | Status: DC | PRN
  Filled 2022-01-13: qty 9, 4d supply, fill #0
  Filled 2022-07-14: qty 9, 4d supply, fill #1

## 2022-01-13 NOTE — Assessment & Plan Note (Signed)
Recommended trial of claritin.

## 2022-01-13 NOTE — Assessment & Plan Note (Signed)
Recommended that she begin motrin 2 days before onset of her period then prn during period.

## 2022-01-13 NOTE — Assessment & Plan Note (Signed)
New. Will rx with prn imitrex, prn zofran. Toradol 60mg  IM given today.

## 2022-01-13 NOTE — Assessment & Plan Note (Signed)
Uncontrolled. Trial of atarax HS PRN.

## 2022-01-13 NOTE — Patient Instructions (Addendum)
Please complete lab work prior to leaving. You may use imitrex as needed for migraine and zofran as needed for nausea.  Complete ultrasound on the first floor to rule out blood clot. For sleep, you can use hydroxyzine nightly as needed. Please schedule an appointment with your GYN for pap smear.

## 2022-01-13 NOTE — Assessment & Plan Note (Addendum)
Wt Readings from Last 3 Encounters:  01/13/22 240 lb (108.9 kg)  07/08/21 230 lb (104.3 kg)  05/25/21 221 lb (100.2 kg)   New. Will check TSH.

## 2022-01-13 NOTE — Assessment & Plan Note (Signed)
Concerning for DVT given her previous Hx of PE. Will refer for stat LE doppler to rule out DVT this am.

## 2022-01-13 NOTE — Progress Notes (Signed)
Subjective:   By signing my name below, I, [name], attest that this documentation has been prepared under the direction and in the presence of [name], NP 01/13/2022    Patient ID: Dana Carpenter, female    DOB: [birth year], 46 y.o.   MRN: [id number]  Chief Complaint  Patient presents with   Annual Exam         HPI Patient is in today for a comprehensive physical exam.  Weight- She would like advice to help with weight management. She fasts and eats a lot of vegetables and protein. She has also started going to the gym but is still not losing weight. Wt Readings from Last 3 Encounters:  01/13/22 240 lb (108.9 kg)  07/08/21 230 lb (104.3 kg)  05/25/21 221 lb (100.2 kg)    Sleep- She has been finding it hard to sleep. She is using 3 tablets of benadryl every night but it only puts her to sleep for a short time. Wakes up again around 3 am. She would like to try a different medication.  Swelling- She reports that for the last 2 weeks, her left leg has been swollen and aching from the knee region to the ankle region.  Headaches- She has had "nagging" headaches and are often accompanied with nausea and dizziness. She has not tried any medication to resolve. Notes it is about a pain level of 10 right now.   Sinus- She reports she has had a some nasal drainage.   Constipation- She reports she is always constipated. She has been using dulcolax but it is only effective for one day.  Menstrual cycles- She adds that they are regular but the cramps are becoming more severe. No changes in consistency or flow.  Mammogram- Last checked on 04/22/2020. Results were normal. DUE  Pap Smear- Last checked on 07/06/2018. Results were normal. Repeat in 3-5 years. Will schedule with OBGYN  Immunizations- She will receive the flu shot today. UTD on tetanus vaccine. She has 2 Covid-19 vaccines at this time.  Dental and Vision- UTD on dental and vision.  Social History- She does not  drink alcohol or use tobacco products. She does not use birth control but is okay with getting pregnant.  No changes in family medical history. No recent surgeries.  She denies having any unexpected weight change, ear pain, hearing loss and rhinorrhea, visual disturbance, chest pain, vomiting, diarrhea and blood in stool, or dysuria and frequency, for arthralgias, rash,adenopathy, depression or anxiety at this time   Past Medical History:  Diagnosis Date   Anxiety    Depression    PE (pulmonary embolism) 03/26/2013   RT LOWER LOBE   Strep pharyngitis     Past Surgical History:  Procedure Laterality Date   DIAGNOSTIC LAPAROSCOPY WITH REMOVAL OF ECTOPIC PREGNANCY N/A 01/27/2017   Procedure: DIAGNOSTIC LAPAROSCOPY WITH RIGHT SALPINGECTOMY FOR RIGHT ECTOPIC PREGNANCY;  Surgeon: Chancy Milroy, MD;  Location: [facility];  Service: Gynecology;  Laterality: N/A;   WISDOM TOOTH EXTRACTION Bilateral     Family History  Problem Relation Age of Onset   Breast cancer Paternal Grandmother    Diabetes Paternal Grandfather    Prostate cancer Paternal Grandfather    Hypertension Father    Heart disease Maternal Grandfather    Cardiomyopathy Maternal Grandfather    Healthy Sister        x 1   Diabetes Sister 68       type II   Hypertension Sister  Heart attack Mother 16       deceased- also had lung disorder (? pulmonary fibrosis)   Lung disease Mother    Heart disease Maternal Aunt        heart disease   Cardiomyopathy Maternal Aunt    Heart disease Cousin    Cardiomyopathy Maternal Aunt    Cardiomyopathy Maternal Aunt     Social History   Socioeconomic History   Marital status: Single    Spouse name: Not on file   Number of children: Not on file   Years of education: Not on file   Highest education level: Not on file  Occupational History   Not on file  Tobacco Use   Smoking status: Never   Smokeless tobacco: Never   Tobacco comments:    never used tobacco  Vaping Use    Vaping Use: Never used  Substance and Sexual Activity   Alcohol use: No    Alcohol/week: 0.0 standard drinks    Comment: occasional   Drug use: No   Sexual activity: Not on file  Other Topics Concern   Not on file  Social History Narrative   Lives with 2 sons [name] 2008, [name] 2011   Older son born 76- in and out of the home   Boyfriend, but single   Works in [employer]   Enjoys sleeping.   Completed some college   Sister lives locally, most of her family in [place] and has 2 aunts in [place]   Social Determinants of Health   Financial Resource Strain: Not on file  Food Insecurity: Not on file  Transportation Needs: Not on file  Physical Activity: Not on file  Stress: Not on file  Social Connections: Not on file  Intimate Partner Violence: Not on file    Outpatient Medications Prior to Visit  Medication Sig Dispense Refill   DULoxetine (CYMBALTA) 60 MG capsule Take 1 capsule (60 mg total) by mouth daily. 90 capsule 1   pantoprazole (PROTONIX) 40 MG tablet TAKE 1 TABLET BY MOUTH EVERY DAY 90 tablet 0   amoxicillin-clavulanate (AUGMENTIN) 875-125 MG tablet Take 1 tablet by mouth 2 (two) times daily. 20 tablet 0   fluconazole (DIFLUCAN) 150 MG tablet Take 1 tablet by mouth as needed for yeast infection. 1 tablet 0   No facility-administered medications prior to visit.    No Known Allergies  Review of Systems  Constitutional:  Negative for fever.  HENT:  Negative for ear pain, hearing loss and sore throat.        (-)nystagmus (-)adenopathy  Eyes:  Negative for blurred vision.  Respiratory:  Positive for cough and sputum production. Negative for shortness of breath and wheezing.   Cardiovascular:  Positive for leg swelling (left leg). Negative for chest pain.  Gastrointestinal:  Positive for constipation and nausea. Negative for blood in stool, diarrhea and vomiting.  Genitourinary:  Negative for dysuria and frequency.  Musculoskeletal:   Positive for myalgias (left leg). Negative for joint pain.  Skin:  Negative for rash.  Neurological:  Positive for headaches.  Psychiatric/Behavioral:  Negative for depression. The patient has insomnia. The patient is not nervous/anxious.       Objective:    Physical Exam Constitutional:      General: She is not in acute distress.    Appearance: Normal appearance. She is not ill-appearing.  HENT:     Head: Normocephalic and atraumatic.     Right Ear: Tympanic membrane, ear canal and external ear normal.  Left Ear: Tympanic membrane, ear canal and external ear normal.  Eyes:     Extraocular Movements: Extraocular movements intact.     Pupils: Pupils are equal, round, and reactive to light.  Cardiovascular:     Rate and Rhythm: Normal rate and regular rhythm.     Pulses: Normal pulses.     Heart sounds: Normal heart sounds. No murmur heard. Pulmonary:     Effort: Pulmonary effort is normal. No respiratory distress.     Breath sounds: Normal breath sounds. No wheezing or rhonchi.  Chest:  Breasts:    Right: Normal.     Left: Normal.  Abdominal:     General: Bowel sounds are normal. There is no distension.     Palpations: Abdomen is soft.     Tenderness: There is no abdominal tenderness. There is no guarding or rebound.  Musculoskeletal:     Cervical back: Neck supple.     Left lower leg: 2+ Edema present.     Comments: Swelling in left leg from ankle to region above knee  Lymphadenopathy:     Cervical: No cervical adenopathy.  Skin:    General: Skin is warm and dry.  Neurological:     Mental Status: She is alert and oriented to person, place, and time.     Deep Tendon Reflexes:     Reflex Scores:      Patellar reflexes are 2+ on the right side and 2+ on the left side. Psychiatric:        Behavior: Behavior normal.        Judgment: Judgment normal.    BP 119/64 (BP Location: Right Arm, Patient Position: Sitting, Cuff Size: Large)    Pulse 70    Temp 98.5 F (36.9  C) (Oral)    Resp 16    Ht _0  (1.676 m)    Wt 240 lb (108.9 kg)    SpO2 100%    BMI 38.74 kg/m  Wt Readings from Last 3 Encounters:  01/13/22 240 lb (108.9 kg)  07/08/21 230 lb (104.3 kg)  05/25/21 221 lb (100.2 kg)   Lab Results  Component Value Date   WBC 8.7 09/30/2020   HGB 13.8 09/30/2020   HCT 42.7 09/30/2020   PLT 232 09/30/2020   GLUCOSE 93 09/30/2020   CHOL 156 03/31/2020   TRIG 68.0 03/31/2020   HDL 39.90 03/31/2020   LDLCALC 102 (H) 03/31/2020   ALT 14 09/30/2020   AST 16 09/30/2020   NA 139 09/30/2020   K 3.9 09/30/2020   CL 104 09/30/2020   CREATININE 0.80 09/30/2020   BUN 16 09/30/2020   CO2 25 09/30/2020   TSH 1.79 09/30/2020   HGBA1C 5.4 02/11/2015    Lab Results  Component Value Date   TSH 1.79 09/30/2020   Lab Results  Component Value Date   WBC 8.7 09/30/2020   HGB 13.8 09/30/2020   HCT 42.7 09/30/2020   MCV 86.1 09/30/2020   PLT 232 09/30/2020   Lab Results  Component Value Date   NA 139 09/30/2020   K 3.9 09/30/2020   CO2 25 09/30/2020   GLUCOSE 93 09/30/2020   BUN 16 09/30/2020   CREATININE 0.80 09/30/2020   BILITOT 0.6 09/30/2020   ALKPHOS 56 03/31/2020   AST 16 09/30/2020   ALT 14 09/30/2020   PROT 7.6 09/30/2020   ALBUMIN 3.8 03/31/2020   CALCIUM 9.8 09/30/2020   ANIONGAP 3 (L) 12/29/2014   GFR 86.85 03/31/2020   Lab Results  Component Value Date   CHOL 156 03/31/2020   Lab Results  Component Value Date   HDL 39.90 03/31/2020   Lab Results  Component Value Date   LDLCALC 102 (H) 03/31/2020   Lab Results  Component Value Date   TRIG 68.0 03/31/2020   Lab Results  Component Value Date   CHOLHDL 4 03/31/2020   Lab Results  Component Value Date   HGBA1C 5.4 02/11/2015       Assessment & Plan:   Problem List Items Addressed This Visit       Unprioritized   Weight gain    Wt Readings from Last 3 Encounters:  01/13/22 240 lb (108.9 kg)  07/08/21 230 lb (104.3 kg)  05/25/21 221 lb (100.2 kg)  New.  Will check TSH.        Relevant Orders   TSH   Rhinorrhea    Recommended trial of claritin.       Preventative health care - Primary    Discussed diet/exercise/weight loss. Due for pap- recommended that she schedule follow up with GYN. Mammo is due. Colo is due.  Flu shot today. Also recommended covid bivalent booster.       Relevant Orders   MM 3D SCREEN BREAST BILATERAL   Obesity (BMI 30-39.9)    We discussed counting calories and trying to keep her calories 1200-1300/day for weight loss reasons.       Migraine without status migrainosus, not intractable    New. Will rx with prn imitrex, prn zofran. Toradol $RemoveBeforeD'60mg'[id number]$  IM given today.       Relevant Medications   SUMAtriptan (IMITREX) 50 MG tablet   Left leg swelling    Concerning for DVT given her previous Hx of PE. Will refer for stat LE doppler to rule out DVT this am.       Relevant Orders   US Venous Img Lower Unilateral Left   Insomnia    Uncontrolled. Trial of atarax HS PRN.       Dysmenorrhea    Recommended that she begin motrin 2 days before onset of her period then prn during period.       Chronic constipation    Recommended that she add miralax 1 tablespoon once daily and titrate upward to achieve on soft BM/day. Increased fiber/water in diet.       Other Visit Diagnoses     Needs flu shot       Relevant Orders   Flu Vaccine QUAD 6+ mos PF IM (Fluarix Quad PF) (Completed)   Colon cancer screening       Relevant Orders   Ambulatory referral to Gastroenterology   Morbid obesity ([place])       Relevant Orders   Comp Met (CMET)   Lipid panel        Meds ordered this encounter  Medications   hydrOXYzine (VISTARIL) 25 MG capsule    Sig: Take 1 capsule (25 mg total) by mouth at bedtime as needed.    Dispense:  30 capsule    Refill:  3    Order Specific Question:   Supervising Provider    Answer:   Penni Homans A [[id number]]   SUMAtriptan (IMITREX) 50 MG tablet    Sig: Take 1 tablet (50 mg total) by mouth  every 2 (two) hours as needed for migraine. May repeat in 2 hours if headache persists or recurs.    Dispense:  10 tablet    Refill:  0    Order Specific Question:  Supervising Provider    Answer:   Penni Homans A [[id number]]   ondansetron (ZOFRAN) 4 MG tablet    Sig: Take 1 tablet (4 mg total) by mouth every 8 (eight) hours as needed for nausea or vomiting.    Dispense:  20 tablet    Refill:  0    Order Specific Question:   Supervising Provider    Answer:   Penni Homans A [[id number]]   ketorolac (TORADOL) injection 60 mg    I,[name],acting as a scribe for [employer], NP.,have documented all relevant documentation on the behalf of Nance Pear, NP,as directed by  Nance Pear, NP while in the presence of Nance Pear, NP.   I, [name], NP, personally preformed the services described in this documentation.  All medical record entries made by the scribe were at my direction and in my presence.  I have reviewed the chart and discharge instructions (if applicable) and agree that the record reflects my personal performance and is accurate and complete. 01/13/2022

## 2022-01-13 NOTE — Assessment & Plan Note (Signed)
Discussed diet/exercise/weight loss. Due for pap- recommended that she schedule follow up with GYN. Mammo is due. Colo is due.  Flu shot today. Also recommended covid bivalent booster.

## 2022-01-13 NOTE — Assessment & Plan Note (Signed)
We discussed counting calories and trying to keep her calories 1200-1300/day for weight loss reasons.

## 2022-01-13 NOTE — Assessment & Plan Note (Signed)
Recommended that she add miralax 1 tablespoon once daily and titrate upward to achieve on soft BM/day. Increased fiber/water in diet.

## 2022-01-15 ENCOUNTER — Telehealth (HOSPITAL_BASED_OUTPATIENT_CLINIC_OR_DEPARTMENT_OTHER): Payer: Self-pay

## 2022-02-01 ENCOUNTER — Inpatient Hospital Stay (HOSPITAL_BASED_OUTPATIENT_CLINIC_OR_DEPARTMENT_OTHER): Admission: RE | Admit: 2022-02-01 | Payer: Managed Care, Other (non HMO) | Source: Ambulatory Visit

## 2022-02-02 ENCOUNTER — Other Ambulatory Visit: Payer: Self-pay | Admitting: Family

## 2022-02-03 ENCOUNTER — Other Ambulatory Visit: Payer: Self-pay

## 2022-02-03 ENCOUNTER — Ambulatory Visit (HOSPITAL_BASED_OUTPATIENT_CLINIC_OR_DEPARTMENT_OTHER)
Admission: RE | Admit: 2022-02-03 | Discharge: 2022-02-03 | Disposition: A | Discharge status: Home / Self Care | Payer: Managed Care, Other (non HMO) | Source: Ambulatory Visit | Attending: Family | Admitting: Family

## 2022-02-03 ENCOUNTER — Encounter (HOSPITAL_BASED_OUTPATIENT_CLINIC_OR_DEPARTMENT_OTHER): Payer: Self-pay

## 2022-02-03 DIAGNOSIS — Z0001 Encounter for general adult medical examination with abnormal findings: Secondary | ICD-10-CM | POA: Diagnosis present

## 2022-02-03 DIAGNOSIS — Z1231 Encounter for screening mammogram for malignant neoplasm of breast: Secondary | ICD-10-CM | POA: Diagnosis not present

## 2022-02-03 MED ORDER — PANTOPRAZOLE SODIUM 40 MG PO TBEC: 40.0000 mg | DELAYED_RELEASE_TABLET | Freq: Every day | ORAL | 0 refills | Status: DC

## 2022-02-08 ENCOUNTER — Ambulatory Visit: Payer: Managed Care, Other (non HMO) | Admitting: Family

## 2022-02-08 ENCOUNTER — Other Ambulatory Visit (HOSPITAL_BASED_OUTPATIENT_CLINIC_OR_DEPARTMENT_OTHER): Payer: Self-pay

## 2022-02-08 DIAGNOSIS — E669 Obesity, unspecified: Secondary | ICD-10-CM | POA: Diagnosis not present

## 2022-02-08 DIAGNOSIS — K5909 Other constipation: Secondary | ICD-10-CM

## 2022-02-08 DIAGNOSIS — J3489 Other specified disorders of nose and nasal sinuses: Secondary | ICD-10-CM | POA: Diagnosis not present

## 2022-02-08 DIAGNOSIS — N946 Dysmenorrhea, unspecified: Secondary | ICD-10-CM

## 2022-02-08 DIAGNOSIS — G4700 Insomnia, unspecified: Secondary | ICD-10-CM

## 2022-02-08 DIAGNOSIS — K219 Gastro-esophageal reflux disease without esophagitis: Secondary | ICD-10-CM

## 2022-02-08 DIAGNOSIS — G43909 Migraine, unspecified, not intractable, without status migrainosus: Secondary | ICD-10-CM

## 2022-02-08 HISTORY — DX: Gastro-esophageal reflux disease without esophagitis: K21.9

## 2022-02-08 MED ORDER — PANTOPRAZOLE SODIUM 40 MG PO TBEC
40.0000 mg | DELAYED_RELEASE_TABLET | Freq: Every day | ORAL | 1 refills | Status: DC
  Filled 2022-02-08: qty 90, 90d supply, fill #0
  Filled 2022-07-14 – 2022-09-10 (×2): qty 90, 90d supply, fill #1

## 2022-02-08 NOTE — Progress Notes (Signed)
Subjective:   By signing my name below, I, Dana Carpenter, attest that this documentation has been prepared under the direction and in the presence of Dana Alar, NP 02/08/2022       Patient ID: Dana Carpenter, female    DOB: [birth year], 46 y.o.   MRN: [id number]  Chief Complaint  Patient presents with   Insomnia    Here for follow up    Constipation    Here for follow up    HPI Patient is in today for an office visit and 4 week f/u  Insomnia- She was prescribed atarax to manage her sleep and she is very impressed with it. She has been able to sleep through the night. Sinus- She was prescribed Claritin at her last visit and she reports it has worked. Migraines- She has been able to use Imitrex twice to manage the migraines and has not had to use Zofran for nausea. Constipation- She started miralax and has been doing well with it. Dysmenorrhea- She was given motrin at her her last visit and she used it for her last menstrual cycle but adds it did not help. She reports it was still "rough". She has a gynecology appointment coming up and is interested  in a low-dose birth control. Weight gain- She is still bothered by her weight. She has an issue with binging and is always late night snacking. She is not exercising consistently due to changes at home and her job. She is on the wait list for the [facility] program.  She is requesting for a refill on 40 mg Protonix.  Past Medical History:  Diagnosis Date   Anxiety    Depression    PE (pulmonary embolism) 03/26/2013   RT LOWER LOBE   Strep pharyngitis     Past Surgical History:  Procedure Laterality Date   DIAGNOSTIC LAPAROSCOPY WITH REMOVAL OF ECTOPIC PREGNANCY N/A 01/27/2017   Procedure: DIAGNOSTIC LAPAROSCOPY WITH RIGHT SALPINGECTOMY FOR RIGHT ECTOPIC PREGNANCY;  Surgeon: Chancy Milroy, MD;  Location: [facility];  Service: Gynecology;  Laterality: N/A;   WISDOM TOOTH EXTRACTION Bilateral     Family  History  Problem Relation Age of Onset   Breast cancer Paternal Grandmother    Diabetes Paternal Grandfather    Prostate cancer Paternal Grandfather    Hypertension Father    Heart disease Maternal Grandfather    Cardiomyopathy Maternal Grandfather    Healthy Sister        x 1   Diabetes Sister 50       type II   Hypertension Sister    Heart attack Mother 36       deceased- also had lung disorder (? pulmonary fibrosis)   Lung disease Mother    Heart disease Maternal Aunt        heart disease   Cardiomyopathy Maternal Aunt    Heart disease Cousin    Cardiomyopathy Maternal Aunt    Cardiomyopathy Maternal Aunt     Social History   Socioeconomic History   Marital status: Single    Spouse name: Not on file   Number of children: Not on file   Years of education: Not on file   Highest education level: Not on file  Occupational History   Not on file  Tobacco Use   Smoking status: Never   Smokeless tobacco: Never   Tobacco comments:    never used tobacco  Vaping Use   Vaping Use: Never used  Substance and Sexual  Activity   Alcohol use: No    Alcohol/week: 0.0 standard drinks    Comment: occasional   Drug use: No   Sexual activity: Not on file  Other Topics Concern   Not on file  Social History Narrative   Lives with 2 sons Dana Carpenter 2008, Dana Carpenter 2011   Older son born 5- in and out of the home   Boyfriend, but single   Works in [employer]   Enjoys sleeping.   Completed some college   Sister lives locally, most of her family in [place] and has 2 aunts in [place]   Social Determinants of Health   Financial Resource Strain: Not on file  Food Insecurity: Not on file  Transportation Needs: Not on file  Physical Activity: Not on file  Stress: Not on file  Social Connections: Not on file  Intimate Partner Violence: Not on file    Outpatient Medications Prior to Visit  Medication Sig Dispense Refill   DULoxetine (CYMBALTA) 60 MG capsule  Take 1 capsule (60 mg total) by mouth daily. 90 capsule 1   hydrOXYzine (VISTARIL) 25 MG capsule Take 1 capsule (25 mg total) by mouth at bedtime as needed. 30 capsule 3   ondansetron (ZOFRAN) 4 MG tablet Take 1 tablet (4 mg total) by mouth every 8 (eight) hours as needed for nausea or vomiting. 20 tablet 0   SUMAtriptan (IMITREX) 50 MG tablet Take 1 tablet (50 mg total) by mouth every 2 (two) hours as needed for migraine. May repeat in 2 hours if headache persists or recurs. 10 tablet 0   pantoprazole (PROTONIX) 40 MG tablet Take 1 tablet (40 mg total) by mouth daily. 90 tablet 0   No facility-administered medications prior to visit.    No Known Allergies  Review of Systems  Constitutional:  Negative for fever.  HENT:  Negative for ear pain and hearing loss.        (-)nystagmus (-)adenopathy  Eyes:  Negative for blurred vision.  Respiratory:  Negative for cough, shortness of breath and wheezing.   Cardiovascular:  Negative for chest pain and leg swelling.  Gastrointestinal:  Negative for blood in stool, diarrhea, nausea and vomiting.  Genitourinary:  Negative for dysuria and frequency.       (+) dysmenorrhea   Musculoskeletal:  Negative for joint pain and myalgias.  Skin:  Negative for rash.  Neurological:  Negative for headaches.  Psychiatric/Behavioral:  Negative for depression. The patient is not nervous/anxious.       Objective:    Physical Exam Constitutional:      General: She is not in acute distress.    Appearance: Normal appearance. She is not ill-appearing.  HENT:     Head: Normocephalic and atraumatic.     Right Ear: External ear normal.     Left Ear: External ear normal.  Eyes:     Extraocular Movements: Extraocular movements intact.     Pupils: Pupils are equal, round, and reactive to light.  Cardiovascular:     Rate and Rhythm: Normal rate and regular rhythm.     Pulses: Normal pulses.     Heart sounds: Normal heart sounds. No murmur heard. Pulmonary:      Effort: Pulmonary effort is normal. No respiratory distress.     Breath sounds: Normal breath sounds. No wheezing or rhonchi.  Abdominal:     General: Bowel sounds are normal. There is no distension.     Palpations: Abdomen is soft.     Tenderness: There  is no abdominal tenderness. There is no guarding or rebound.  Musculoskeletal:     Cervical back: Neck supple.  Lymphadenopathy:     Cervical: No cervical adenopathy.  Skin:    General: Skin is warm and dry.  Neurological:     Mental Status: She is alert and oriented to person, place, and time.  Psychiatric:        Behavior: Behavior normal.        Judgment: Judgment normal.    BP 113/70 (BP Location: Right Arm, Patient Position: Sitting, Cuff Size: Large)    Pulse 67    Temp 98.4 F (36.9 C) (Oral)    Resp 16    Wt 237 lb (107.5 kg)    LMP 01/27/2022    SpO2 98%    BMI 38.25 kg/m  Wt Readings from Last 3 Encounters:  02/08/22 237 lb (107.5 kg)  01/13/22 240 lb (108.9 kg)  07/08/21 230 lb (104.3 kg)    Diabetic Foot Exam - Simple   No data filed    Lab Results  Component Value Date   WBC 8.7 09/30/2020   HGB 13.8 09/30/2020   HCT 42.7 09/30/2020   PLT 232 09/30/2020   GLUCOSE 94 01/13/2022   CHOL 150 01/13/2022   TRIG 72.0 01/13/2022   HDL 47.40 01/13/2022   LDLCALC 88 01/13/2022   ALT 15 01/13/2022   AST 16 01/13/2022   NA 139 01/13/2022   K 4.4 01/13/2022   CL 105 01/13/2022   CREATININE 1.00 01/13/2022   BUN 14 01/13/2022   CO2 29 01/13/2022   TSH 1.45 01/13/2022   HGBA1C 5.4 02/11/2015    Lab Results  Component Value Date   TSH 1.45 01/13/2022   Lab Results  Component Value Date   WBC 8.7 09/30/2020   HGB 13.8 09/30/2020   HCT 42.7 09/30/2020   MCV 86.1 09/30/2020   PLT 232 09/30/2020   Lab Results  Component Value Date   NA 139 01/13/2022   K 4.4 01/13/2022   CO2 29 01/13/2022   GLUCOSE 94 01/13/2022   BUN 14 01/13/2022   CREATININE 1.00 01/13/2022   BILITOT 0.6 01/13/2022   ALKPHOS 60  01/13/2022   AST 16 01/13/2022   ALT 15 01/13/2022   PROT 6.8 01/13/2022   ALBUMIN 3.7 01/13/2022   CALCIUM 9.1 01/13/2022   ANIONGAP 3 (L) 12/29/2014   GFR 68.04 01/13/2022   Lab Results  Component Value Date   CHOL 150 01/13/2022   Lab Results  Component Value Date   HDL 47.40 01/13/2022   Lab Results  Component Value Date   LDLCALC 88 01/13/2022   Lab Results  Component Value Date   TRIG 72.0 01/13/2022   Lab Results  Component Value Date   CHOLHDL 3 01/13/2022   Lab Results  Component Value Date   HGBA1C 5.4 02/11/2015       Assessment & Plan:   Problem List Items Addressed This Visit       Unprioritized   Rhinorrhea    Improved. Continue claritin once daily.       Obesity (BMI 30-39.9)    Wt Readings from Last 3 Encounters:  02/08/22 237 lb (107.5 kg)  01/13/22 240 lb (108.9 kg)  07/08/21 230 lb (104.3 kg)  She is frustrated with her weight. She is on the weight list at [facility]. I encouraged her to work on increasing exercise, limiting late night snacking.  Call to see if there are any  cancellations at [facility].       Migraine without status migrainosus, not intractable    Improved- good response with imitrex prn. Continue same.       Insomnia    Improved on hs prn Atarax.  Continue same.       GERD (gastroesophageal reflux disease)    Stable on protonix '40mg'$ . Continue same.       Relevant Medications   pantoprazole (PROTONIX) 40 MG tablet   Dysmenorrhea    Ongoing, advised her to keep her upcoming appointment with GYN.       Chronic constipation    Improved with use of daily miralax.  Continue same.         Meds ordered this encounter  Medications   pantoprazole (PROTONIX) 40 MG tablet    Sig: Take 1 tablet (40 mg total) by mouth daily.    Dispense:  90 tablet    Refill:  1    Order Specific Question:   Supervising Provider    Answer:   Penni Homans A [[id number]]    I,Dana Carpenter,acting as  a scribe for Nance Pear, NP.,have documented all relevant documentation on the behalf of Nance Pear, NP,as directed by  Nance Pear, NP while in the presence of Nance Pear, NP.   I, Dana Alar, NP, personally preformed the services described in this documentation.  All medical record entries made by the scribe were at my direction and in my presence.  I have reviewed the chart and discharge instructions (if applicable) and agree that the record reflects my personal performance and is accurate and complete. 02/08/2022

## 2022-02-08 NOTE — Assessment & Plan Note (Signed)
Improved with use of daily miralax.  Continue same.  ?

## 2022-02-08 NOTE — Assessment & Plan Note (Signed)
Ongoing, advised her to keep her upcoming appointment with GYN.  ?

## 2022-02-08 NOTE — Assessment & Plan Note (Addendum)
Wt Readings from Last 3 Encounters:  ?02/08/22 237 lb (107.5 kg)  ?01/13/22 240 lb (108.9 kg)  ?07/08/21 230 lb (104.3 kg)  ? ?She is frustrated with her weight. She is on the weight list at [facility]. I encouraged her to work on increasing exercise, limiting late night snacking.  Call to see if there are any cancellations at [facility].  ?

## 2022-02-08 NOTE — Assessment & Plan Note (Signed)
Stable on protonix 40mg.  Continue same.  

## 2022-02-08 NOTE — Patient Instructions (Signed)
Please keep your upcoming appointment with GYN.  ?

## 2022-02-08 NOTE — Assessment & Plan Note (Signed)
Improved. Continue claritin once daily.  ?

## 2022-02-08 NOTE — Assessment & Plan Note (Signed)
Improved on hs prn Atarax.  Continue same.  ?

## 2022-02-08 NOTE — Assessment & Plan Note (Signed)
Improved- good response with imitrex prn. Continue same.  ?

## 2022-02-10 ENCOUNTER — Ambulatory Visit (AMBULATORY_SURGERY_CENTER): Payer: Managed Care, Other (non HMO) | Admitting: *Deleted

## 2022-02-10 ENCOUNTER — Other Ambulatory Visit: Payer: Self-pay

## 2022-02-10 VITALS — Ht 66.0 in | Wt 235.0 lb

## 2022-02-10 DIAGNOSIS — Z1211 Encounter for screening for malignant neoplasm of colon: Secondary | ICD-10-CM

## 2022-02-10 MED ORDER — NA SULFATE-K SULFATE-MG SULF 17.5-3.13-1.6 GM/177ML PO SOLN: 2.0000 | Freq: Once | ORAL | 0 refills | Status: AC

## 2022-02-10 NOTE — Progress Notes (Signed)
No egg or soy allergy known to patient  ?No issues known to pt with past sedation with any surgeries or procedures ?Patient denies ever being told they had issues or difficulty with intubation  ?No FH of Malignant Hyperthermia ?Pt is not on diet pills ?Pt is not on  home 02  ?Pt is not on blood thinners  ?Pt denies issues with constipation, states uses Miralax daily. Encouraged to continue to use daily prior to exam ?No A fib or A flutter ? ?Pt is fully vaccinated  for Covid  ? ? ? ?Due to the COVID-19 pandemic we are asking patients to follow certain guidelines in PV and the [place]   ?Pt aware of COVID protocols and [facility] guidelines  ? ?PV completed over the phone. Pt verified name, DOB, address and insurance during PV today.  ?Pt mailed instruction packet with copy of consent form to read and not return, and instructions.  ?Pt encouraged to call with questions or issues.  ?If pt has My chart, procedure instructions sent via My Chart   ?

## 2022-02-11 ENCOUNTER — Telehealth: Payer: Self-pay | Admitting: Family

## 2022-02-11 ENCOUNTER — Emergency Department (HOSPITAL_BASED_OUTPATIENT_CLINIC_OR_DEPARTMENT_OTHER): Payer: Managed Care, Other (non HMO)

## 2022-02-11 ENCOUNTER — Other Ambulatory Visit: Payer: Self-pay

## 2022-02-11 ENCOUNTER — Other Ambulatory Visit (HOSPITAL_BASED_OUTPATIENT_CLINIC_OR_DEPARTMENT_OTHER): Payer: Self-pay

## 2022-02-11 ENCOUNTER — Emergency Department (HOSPITAL_BASED_OUTPATIENT_CLINIC_OR_DEPARTMENT_OTHER)
Admission: EM | Admit: 2022-02-11 | Discharge: 2022-02-11 | Disposition: A | Discharge status: Home / Self Care | Payer: Managed Care, Other (non HMO) | Attending: Emergency Medicine | Admitting: Emergency Medicine

## 2022-02-11 ENCOUNTER — Encounter: Payer: Managed Care, Other (non HMO) | Admitting: Family Medicine

## 2022-02-11 ENCOUNTER — Encounter (HOSPITAL_BASED_OUTPATIENT_CLINIC_OR_DEPARTMENT_OTHER): Payer: Self-pay

## 2022-02-11 DIAGNOSIS — M791 Myalgia, unspecified site: Secondary | ICD-10-CM | POA: Diagnosis not present

## 2022-02-11 DIAGNOSIS — R509 Fever, unspecified: Secondary | ICD-10-CM | POA: Diagnosis not present

## 2022-02-11 DIAGNOSIS — R112 Nausea with vomiting, unspecified: Secondary | ICD-10-CM | POA: Insufficient documentation

## 2022-02-11 DIAGNOSIS — R109 Unspecified abdominal pain: Secondary | ICD-10-CM | POA: Diagnosis not present

## 2022-02-11 DIAGNOSIS — Z20822 Contact with and (suspected) exposure to covid-19: Secondary | ICD-10-CM | POA: Diagnosis not present

## 2022-02-11 DIAGNOSIS — J111 Influenza due to unidentified influenza virus with other respiratory manifestations: Secondary | ICD-10-CM

## 2022-02-11 LAB — URINALYSIS, ROUTINE W REFLEX MICROSCOPIC
Bilirubin Urine: NEGATIVE
Glucose, UA: NEGATIVE mg/dL
Hgb urine dipstick: NEGATIVE
Ketones, ur: NEGATIVE mg/dL
Leukocytes,Ua: NEGATIVE
Nitrite: NEGATIVE
Protein, ur: NEGATIVE mg/dL
Specific Gravity, Urine: 1.02 (ref 1.005–1.030)
pH: 5 (ref 5.0–8.0)

## 2022-02-11 LAB — CBC WITH DIFFERENTIAL/PLATELET
Abs Immature Granulocytes: 0.06 10*3/uL (ref 0.00–0.07)
Basophils Absolute: 0.1 10*3/uL (ref 0.0–0.1)
Basophils Relative: 0 %
Eosinophils Absolute: 0.2 10*3/uL (ref 0.0–0.5)
Eosinophils Relative: 1 %
HCT: 42 % (ref 36.0–46.0)
Hemoglobin: 13.8 g/dL (ref 12.0–15.0)
Immature Granulocytes: 0 %
Lymphocytes Relative: 4 %
Lymphs Abs: 0.6 10*3/uL — ABNORMAL LOW (ref 0.7–4.0)
MCH: 28 pg (ref 26.0–34.0)
MCHC: 32.9 g/dL (ref 30.0–36.0)
MCV: 85.2 fL (ref 80.0–100.0)
Monocytes Absolute: 0.4 10*3/uL (ref 0.1–1.0)
Monocytes Relative: 3 %
Neutro Abs: 16.3 10*3/uL — ABNORMAL HIGH (ref 1.7–7.7)
Neutrophils Relative %: 92 %
Platelets: 262 10*3/uL (ref 150–400)
RBC: 4.93 MIL/uL (ref 3.87–5.11)
RDW: 14.2 % (ref 11.5–15.5)
WBC: 17.7 10*3/uL — ABNORMAL HIGH (ref 4.0–10.5)
nRBC: 0 % (ref 0.0–0.2)

## 2022-02-11 LAB — COMPREHENSIVE METABOLIC PANEL
ALT: 13 U/L (ref 0–44)
AST: 16 U/L (ref 15–41)
Albumin: 3.9 g/dL (ref 3.5–5.0)
Alkaline Phosphatase: 61 U/L (ref 38–126)
Anion gap: 9 (ref 5–15)
BUN: 16 mg/dL (ref 6–20)
CO2: 24 mmol/L (ref 22–32)
Calcium: 9.2 mg/dL (ref 8.9–10.3)
Chloride: 105 mmol/L (ref 98–111)
Creatinine, Ser: 1.11 mg/dL — ABNORMAL HIGH (ref 0.44–1.00)
GFR, Estimated: >60 mL/min (ref >60)
Glucose, Bld: 114 mg/dL — ABNORMAL HIGH (ref 70–99)
Potassium: 4 mmol/L (ref 3.5–5.1)
Sodium: 138 mmol/L (ref 135–145)
Total Bilirubin: 0.5 mg/dL (ref 0.3–1.2)
Total Protein: 7.5 g/dL (ref 6.5–8.1)

## 2022-02-11 LAB — PREGNANCY, URINE: Preg Test, Ur: NEGATIVE

## 2022-02-11 LAB — RESP PANEL BY RT-PCR (FLU A&B, COVID) ARPGX2
Influenza A by PCR: NEGATIVE
Influenza B by PCR: NEGATIVE
SARS Coronavirus 2 by RT PCR: NEGATIVE

## 2022-02-11 LAB — LIPASE, BLOOD: Lipase: 34 U/L (ref 11–51)

## 2022-02-11 MED ORDER — SODIUM CHLORIDE 0.9 % IV BOLUS
1000.0000 mL | Freq: Once | INTRAVENOUS | Status: AC
  Administered 2022-02-11: 12:00:00 1000 mL via INTRAVENOUS

## 2022-02-11 MED ORDER — ONDANSETRON HCL 4 MG/2ML IJ SOLN
4.0000 mg | Freq: Once | INTRAMUSCULAR | Status: AC
  Administered 2022-02-11: 12:00:00 4 mg via INTRAVENOUS
  Filled 2022-02-11: qty 2

## 2022-02-11 MED ORDER — KETOROLAC TROMETHAMINE 15 MG/ML IJ SOLN
15.0000 mg | Freq: Once | INTRAMUSCULAR | Status: AC
  Administered 2022-02-11: 13:00:00 15 mg via INTRAVENOUS
  Filled 2022-02-11: qty 1

## 2022-02-11 MED ORDER — ACETAMINOPHEN 500 MG PO TABS
1000.0000 mg | ORAL_TABLET | Freq: Once | ORAL | Status: AC
  Administered 2022-02-11: 12:00:00 1000 mg via ORAL
  Filled 2022-02-11: qty 2

## 2022-02-11 MED ORDER — ONDANSETRON HCL 4 MG PO TABS
4.0000 mg | ORAL_TABLET | Freq: Four times a day (QID) | ORAL | 0 refills | Status: DC
  Filled 2022-02-11: qty 9, 3d supply, fill #0
  Filled 2022-07-14: qty 3, 1d supply, fill #1

## 2022-02-11 NOTE — ED Provider Notes (Signed)
[facility] EMERGENCY DEPARTMENT Provider Note   CSN: [id number] Arrival date & time: 02/11/22  1127     History  Chief Complaint  Patient presents with   Generalized Body Aches    Dana Carpenter is a 46 y.o. female.  The history is provided by the patient.  Illness Location:  Generalied Quality:  Body aches Severity:  Mild Onset quality:  Gradual Duration:  2 hours Timing:  Constant Progression:  Unchanged Chronicity:  New Relieved by:  Nothing Worsened by:  Nothing Associated symptoms: abdominal pain, fever, myalgias, nausea and vomiting   Associated symptoms: no chest pain, no congestion, no cough, no diarrhea, no ear pain, no fatigue, no headaches, no loss of consciousness, no rash, no rhinorrhea, no shortness of breath, no sore throat and no wheezing   Risk factors:  Anxiety, PE, depression     Home Medications Prior to Admission medications   Medication Sig Start Date End Date Taking? Authorizing Provider  ondansetron (ZOFRAN) 4 MG tablet Take 1 tablet (4 mg total) by mouth every 6 (six) hours. 02/11/22  Yes Eldred Lievanos, DO  DULoxetine (CYMBALTA) 60 MG capsule Take 1 capsule (60 mg total) by mouth daily. 08/18/21   [name], NP  hydrOXYzine (VISTARIL) 25 MG capsule Take 1 capsule (25 mg total) by mouth at bedtime as needed. 01/13/22   [name], NP  Multiple Vitamin (MULTIVITAMIN) tablet Take 1 tablet by mouth daily.    [provider]  pantoprazole (PROTONIX) 40 MG tablet Take 1 tablet (40 mg total) by mouth daily. 02/08/22   [name], NP  Probiotic Product (PROBIOTIC-10 PO) Take by mouth.    [provider]  SODIUM FLUORIDE 5000 PPM 1.1 % PSTE Take by mouth. 01/21/22   [provider]  SUMAtriptan (IMITREX) 50 MG tablet Take 1 tablet (50 mg total) by mouth every 2 (two) hours as needed for migraine. May repeat in 2 hours if headache persists or recurs. 01/13/22   [name], NP       Allergies    Patient has no known allergies.    Review of Systems   Review of Systems  Constitutional:  Positive for chills and fever. Negative for fatigue.  HENT:  Negative for congestion, ear pain, rhinorrhea and sore throat.   Respiratory:  Negative for cough, shortness of breath and wheezing.   Cardiovascular:  Negative for chest pain.  Gastrointestinal:  Positive for abdominal pain, nausea and vomiting. Negative for diarrhea.  Musculoskeletal:  Positive for arthralgias and myalgias.  Skin:  Negative for rash.  Neurological:  Negative for loss of consciousness and headaches.   Physical Exam Updated Vital Signs  ED Triage Vitals [02/11/22 1141]  Enc Vitals Group     BP 118/90     Pulse Rate (!) 128     Resp 20     Temp (!) 103.3 F (39.6 C)     Temp Source Oral     SpO2 98 %     Weight 235 lb (106.6 kg)     Height '5\' 6"'$  (1.676 m)     Head Circumference      Peak Flow      Pain Score 10     Pain Loc      Pain Edu?      Excl. in [place]?     Physical Exam Vitals and nursing note reviewed.  Constitutional:      General: She is in acute distress (uncomfortable).     Appearance:  She is well-developed. She is not ill-appearing.  HENT:     Head: Normocephalic and atraumatic.     Nose: Nose normal.     Mouth/Throat:     Mouth: Mucous membranes are moist.  Eyes:     Extraocular Movements: Extraocular movements intact.     Conjunctiva/sclera: Conjunctivae normal.     Pupils: Pupils are equal, round, and reactive to light.  Cardiovascular:     Rate and Rhythm: Normal rate and regular rhythm.     Pulses: Normal pulses.     Heart sounds: Normal heart sounds. No murmur heard. Pulmonary:     Effort: Pulmonary effort is normal. No respiratory distress.     Breath sounds: Normal breath sounds.  Abdominal:     General: There is no distension.     Palpations: Abdomen is soft.     Tenderness: There is no abdominal tenderness. There is no guarding.  Musculoskeletal:         General: No swelling. Normal range of motion.     Cervical back: Normal range of motion and neck supple.  Skin:    General: Skin is warm and dry.     Capillary Refill: Capillary refill takes less than 2 seconds.  Neurological:     General: No focal deficit present.     Mental Status: She is alert and oriented to person, place, and time.     Cranial Nerves: No cranial nerve deficit.     Sensory: No sensory deficit.     Motor: No weakness.     Coordination: Coordination normal.  Psychiatric:        Mood and Affect: Mood normal.    ED Results / Procedures / Treatments   Labs (all labs ordered are listed, but only abnormal results are displayed) Labs Reviewed  CBC WITH DIFFERENTIAL/PLATELET - Abnormal; Notable for the following components:      Result Value   WBC 17.7 (*)    Neutro Abs 16.3 (*)    Lymphs Abs 0.6 (*)    All other components within normal limits  COMPREHENSIVE METABOLIC PANEL - Abnormal; Notable for the following components:   Glucose, Bld 114 (*)    Creatinine, Ser 1.11 (*)    All other components within normal limits  RESP PANEL BY RT-PCR (FLU A&B, COVID) ARPGX2  URINE CULTURE  LIPASE, BLOOD  PREGNANCY, URINE  URINALYSIS, ROUTINE W REFLEX MICROSCOPIC    EKG EKG Interpretation  Date/Time:  Thursday February 11 2022 11:47:00 EST Ventricular Rate:  123 PR Interval:  167 QRS Duration: 87 QT Interval:  287 QTC Calculation: 411 R Axis:   28 Text Interpretation: Sinus tachycardia Confirmed by Lennice Sites ([id number]) on 02/11/2022 11:52:56 AM  Radiology DG Chest Portable 1 View  Result Date: 02/11/2022 CLINICAL DATA:  Fever, body aches and chills.  Nausea and vomiting. EXAM: PORTABLE CHEST 1 VIEW COMPARISON:  December 21, 2019 FINDINGS: The heart size and mediastinal contours are within normal limits. No focal airspace consolidation. No visible pleural effusion or pneumothorax. The visualized skeletal structures are unremarkable. IMPRESSION: No active cardiopulmonary  disease. Electronically Signed   By: Dahlia Bailiff M.D.   On: 02/11/2022 12:23    Procedures Procedures    Medications Ordered in ED Medications  sodium chloride 0.9 % bolus 1,000 mL (1,000 mLs Intravenous New Bag/Given 02/11/22 1153)  ondansetron (ZOFRAN) injection 4 mg (4 mg Intravenous Given 02/11/22 1151)  acetaminophen (TYLENOL) tablet 1,000 mg (1,000 mg Oral Given 02/11/22 1151)  ketorolac (TORADOL) 15 MG/ML  injection 15 mg (15 mg Intravenous Given 02/11/22 1255)    ED Course/ Medical Decision Making/ A&P                           Medical Decision Making Amount and/or Complexity of Data Reviewed Labs: ordered. Radiology: ordered.  Risk OTC drugs. Prescription drug management.   Dana Carpenter is here with nausea, vomiting, fever, chills.  Symptoms started this morning.  Patient arrives febrile and tachycardic.  No major medical problems.  EKG per my review and interpretation shows sinus tachycardia, no ischemic changes.  She has a history of pulmonary embolism about a decade ago but not on blood thinners anymore.  She denies any cough, shortness of breath.  No known sick contacts or suspicious food intake.  She is having body aches and abdominal cramping but no diarrhea.  She has not taken any Tylenol or ibuprofen.  Differential is likely viral process.  Seems less likely to be pneumonia or UTI.  Could be colitis.  Abdominal exam is overall unremarkable.  Nonfocal.  Lower suspicion for appendicitis but will repeat exam when she is more comfortable.  Could be a colitis.  Will check CBC, CMP, lipase, urinalysis, chest x-ray, COVID and flu test.  Will give IV fluids, Tylenol, Zofran.  I have a lower suspicion for sepsis at this time given history and physical.  We will hold on antibiotics, blood cultures.  Patient with mild leukocytosis of 17.  However no significant anemia, electrolyte abnormality, kidney injury.  Gallbladder and liver enzymes within normal limits.  Lipase within normal  limits.  Urinalysis negative for infection.  Chest x-ray per my review and interpretation shows no significant anemia or electrolyte abnormality.  I reviewed and interpreted labs.  COVID and flu test are also negative.  Patient was reevaluated.  Tachycardia improved.  Fever has resolved.  She is feeling much better.  Repeat abdominal exam is benign.  Nausea has improved.  I have very low suspicion for an intra-abdominal process such as appendicitis.  No concern for cholecystitis.  Overall suspect a viral GI process.  Recommend continued use of Tylenol and ibuprofen.  Will treat with Zofran.  She understands return precautions including worsening abdominal pain and focal abdominal pain.  We will hold off on any advanced imaging at this time.  Discharged in good condition.  This chart was dictated using voice recognition software.  Despite best efforts to proofread,  errors can occur which can change the documentation meaning.         Final Clinical Impression(s) / ED Diagnoses Final diagnoses:  Influenza-like illness  Nausea and vomiting, unspecified vomiting type    Rx / DC Orders ED Discharge Orders          Ordered    ondansetron (ZOFRAN) 4 MG tablet  Every 6 hours        02/11/22 1311              Alin Chavira, DO 02/11/22 1312

## 2022-02-11 NOTE — ED Triage Notes (Signed)
Pt arrives to ED in wheelchair from PCP upstairs. Pt reports she woke up today with body aches, fever, and chills. Pt also having nausea and vomiting, no diarrhea.  ?

## 2022-02-11 NOTE — Discharge Instructions (Signed)
Continue Zofran for nausea and vomiting.  Recommend bland diet.  Please return if symptoms worsen including worsening abdominal pain as discussed.  Overall suspect that you have a viral gastroenteritis/stomach bug.  Recommend 1000 mg of Tylenol every 6 hours as needed for pain and fever.  Recommend 800 mg ibuprofen every 8 hours as needed for pain and fever. ?

## 2022-02-11 NOTE — Telephone Encounter (Signed)
Patient called in a panic & crying uncontrolablly ? ?Patient stated that she had extreme body aches & cold chills. Happened suddenly. ? ?Sent to triage ?

## 2022-02-11 NOTE — Telephone Encounter (Signed)
Patient was put on Dr. Nonda Lou schedule but when she came in she complained of generalized body ache 10 our of 10 with vomiting and chills. She reported this started about 10 am  and that she was "ok before that".  ?Ok per Dr. Etter Sjogren she was taken to ED and was roomed within minutes.  ?

## 2022-02-11 NOTE — ED Notes (Signed)
No acute distress noted upon this RN's departure of patient. Patient states her nausea is much better and the pain has subsided. Verified discharge paperwork with name and DOB. Vital signs stable. Patient taken to checkout window. Discharge paperwork discussed with fiance. No further questions voiced upon discharge.  ? ?Patient wanted to walk out, then later decided to take a wc. Wheeled out by fiance.  ?

## 2022-02-12 NOTE — Progress Notes (Signed)
This encounter was created in error - please disregard.

## 2022-02-13 LAB — URINE CULTURE: Culture: <10000 — AB

## 2022-02-15 ENCOUNTER — Telehealth (INDEPENDENT_AMBULATORY_CARE_PROVIDER_SITE_OTHER): Payer: Managed Care, Other (non HMO) | Admitting: Family Medicine

## 2022-02-15 ENCOUNTER — Encounter: Payer: Self-pay | Admitting: Family Medicine

## 2022-02-15 DIAGNOSIS — G43909 Migraine, unspecified, not intractable, without status migrainosus: Secondary | ICD-10-CM

## 2022-02-15 DIAGNOSIS — R112 Nausea with vomiting, unspecified: Secondary | ICD-10-CM

## 2022-02-15 MED ORDER — PROMETHAZINE HCL 25 MG PO TABS: 25.0000 mg | ORAL_TABLET | Freq: Four times a day (QID) | ORAL | 2 refills | Status: DC | PRN

## 2022-02-15 MED ORDER — PREDNISONE 20 MG PO TABS: 40.0000 mg | ORAL_TABLET | Freq: Every day | ORAL | 0 refills | Status: AC

## 2022-02-15 NOTE — Progress Notes (Signed)
Headache started Sat at the back of her neck ?Rotating between tylenol and ibuprofen as well as her Imitrex ?

## 2022-02-15 NOTE — Progress Notes (Signed)
Virtual Video Visit via MyChart Note ? ?I connected with  Dana Carpenter on 02/15/22 at  9:20 AM EDT by the video enabled telemedicine application for MyChart, and verified that I am speaking with the correct person using two identifiers. ?  ?I introduced myself as a Designer, jewellery with the practice. We discussed the limitations of evaluation and management by telemedicine and the availability of in person appointments. The patient expressed understanding and agreed to proceed. ? ?Participating parties in this visit include: The patient and the nurse practitioner listed.  ?The patient is: in parked car ?I am: In the office - [facility] ? ?Subjective:   ? ?CC: No chief complaint on file. ? ? ?HPI: Dana Carpenter is a 46 y.o. year old female presenting today via [place] today for migraine. ? ?Patient states she woke up Thursday (4 days ago) with chills, body aches, fever 103. Went to the ED and tested negative for COVID and Flu. She was given Zofran at discharge. Reports she is still extremely nauseous and the Zofran is not helping. She vomited twice this morning. She continues to have horrible headache/migraines that seems to start posteriorly and radiate anteriorly. Her Imitrex has not helped at all. She is now having pain bilaterally and continues to have photophobia, fatigue. She is not having anymore body aches. Denies chest pain, dyspnea, wheezing, GU symptoms.  ? ? ? ?Past medical history, Surgical history, Family history not pertinant except as noted below, Social history, Allergies, and medications have been entered into the medical record, reviewed, and corrections made.  ? ?Review of Systems:  ?All review of systems negative except what is listed in the HPI ? ? ?Objective:   ? ?General:  ?Speaking clearly in complete sentences. ?Absent shortness of breath noted.   ?Alert and oriented x3.   ?Normal judgment.  ?Absent acute distress. ? ? ?Impression and Recommendations:    ? ?1. Migraine without status migrainosus, not intractable, unspecified migraine type ?Recommend she swab for COVID again in case first swab was too soon. Imitrex not helping. No red flags. Will try prednisone burst (know not take with NSAIDS and phenergan to stop the headaches/nausea. Continue supportive measures including rest, hydration, humidifier use, steam showers, warm compresses to sinuses, warm liquids with lemon and honey, and over-the-counter cough, cold, and analgesics as needed.  Patient aware of signs/symptoms requiring further/urgent evaluation.  ? ?- promethazine (PHENERGAN) 25 MG tablet; Take 1 tablet (25 mg total) by mouth every 6 (six) hours as needed for nausea or vomiting.  Dispense: 30 tablet; Refill: 2 ?- predniSONE (DELTASONE) 20 MG tablet; Take 2 tablets (40 mg total) by mouth daily with breakfast for 5 days.  Dispense: 10 tablet; Refill: 0 ? ?2. Nausea and vomiting, unspecified vomiting type ?- promethazine (PHENERGAN) 25 MG tablet; Take 1 tablet (25 mg total) by mouth every 6 (six) hours as needed for nausea or vomiting.  Dispense: 30 tablet; Refill: 2 ? ? ?Follow-up if symptoms worsen or fail to improve.  ?  ?I discussed the assessment and treatment plan with the patient. The patient was provided an opportunity to ask questions and all were answered. The patient agreed with the plan and demonstrated an understanding of the instructions. ?  ?The patient was advised to call back or seek an in-person evaluation if the symptoms worsen or if the condition fails to improve as anticipated. ? ?I spent 20 minutes dedicated to the care of this patient on the date  of this encounter to include pre-visit chart review of prior notes and results, face-to-face time with the patient, and post-visit ordering of testing as indicated.  ? ?Terrilyn Saver, NP  ? ?

## 2022-02-17 ENCOUNTER — Encounter: Payer: Self-pay | Admitting: Gastroenterology

## 2022-02-22 ENCOUNTER — Other Ambulatory Visit (HOSPITAL_COMMUNITY)
Admission: RE | Admit: 2022-02-22 | Discharge: 2022-02-22 | Disposition: A | Discharge status: Home / Self Care | Payer: Managed Care, Other (non HMO) | Source: Ambulatory Visit | Attending: Obstetrics & Gynecology | Admitting: Obstetrics & Gynecology

## 2022-02-22 ENCOUNTER — Encounter: Payer: Self-pay | Admitting: Obstetrics & Gynecology

## 2022-02-22 ENCOUNTER — Ambulatory Visit (INDEPENDENT_AMBULATORY_CARE_PROVIDER_SITE_OTHER): Payer: Managed Care, Other (non HMO) | Admitting: Obstetrics & Gynecology

## 2022-02-22 ENCOUNTER — Other Ambulatory Visit: Payer: Self-pay

## 2022-02-22 VITALS — BP 122/74 | HR 67 | Wt 237.0 lb

## 2022-02-22 DIAGNOSIS — Z01419 Encounter for gynecological examination (general) (routine) without abnormal findings: Secondary | ICD-10-CM | POA: Insufficient documentation

## 2022-02-22 NOTE — Progress Notes (Signed)
? ? ?GYNECOLOGY ANNUAL PREVENTATIVE CARE ENCOUNTER NOTE ? ?History:    ? Dana Carpenter is a 46 y.o. [phone] female here for a routine annual gynecologic exam.  Current complaints: mild vaginal dryness noted during intercourse.   Denies abnormal vaginal bleeding, discharge, pelvic pain, problems with intercourse or other gynecologic concerns.  ?  ?Gynecologic History ?Patient's last menstrual period was 01/27/2022. ?Contraception: none ?Last Pap: 07/06/2018. Result was normal with negative HPV ?Last Mammogram: 02/03/2022.  Result was normal ? ?Obstetric History ?OB History  ?Gravida Para Term Preterm AB Living  ?'8 4 4   4 4  '$ ?SAB IAB Ectopic Multiple Live Births  ?  '2 2   4  '$ ?  ?# Outcome Date GA Lbr Len/2nd Weight Sex Delivery Anes PTL Lv  ?8 Term 2011 [redacted]w[redacted]d 7 lb (3.175 kg) M Vag-Spont   LIV  ?7 Term 2008 411w0d7 lb (3.175 kg) M Vag-Spont   LIV  ?6 Term 1999 4048w0d lb (3.175 kg) M Vag-Spont   LIV  ?5 Term 1997 39w3w0dlb (3.175 kg) M Vag-Spont   LIV  ?4 IAB           ?3 IAB           ?2 Ectopic           ?1 Ectopic           ? ? ?Past Medical History:  ?Diagnosis Date  ? Anxiety   ? Depression   ? PE (pulmonary embolism) 03/26/2013  ? RT LOWER LOBE  ? Strep pharyngitis   ? ? ?Past Surgical History:  ?Procedure Laterality Date  ? DIAGNOSTIC LAPAROSCOPY WITH REMOVAL OF ECTOPIC PREGNANCY N/A 01/27/2017  ? Procedure: DIAGNOSTIC LAPAROSCOPY WITH RIGHT SALPINGECTOMY FOR RIGHT ECTOPIC PREGNANCY;  Surgeon: MichChancy Milroy;  [facility];  Service: Gynecology;  Laterality: N/A;  ? WISDOM TOOTH EXTRACTION Bilateral   ? ? ?Current Outpatient Medications on File Prior to Visit  ?Medication Sig Dispense Refill  ? DULoxetine (CYMBALTA) 60 MG capsule Take 1 capsule (60 mg total) by mouth daily. 90 capsule 1  ? hydrOXYzine (VISTARIL) 25 MG capsule Take 1 capsule (25 mg total) by mouth at bedtime as needed. 30 capsule 3  ? Multiple Vitamin (MULTIVITAMIN) tablet Take 1 tablet by mouth daily.    ? ondansetron (ZOFRAN) 4 MG  tablet Take 1 tablet (4 mg total) by mouth every 6 (six) hours. 12 tablet 0  ? pantoprazole (PROTONIX) 40 MG tablet Take 1 tablet (40 mg total) by mouth daily. 90 tablet 1  ? Probiotic Product (PROBIOTIC-10 PO) Take by mouth.    ? promethazine (PHENERGAN) 25 MG tablet Take 1 tablet (25 mg total) by mouth every 6 (six) hours as needed for nausea or vomiting. 30 tablet 2  ? SODIUM FLUORIDE 5000 PPM 1.1 % PSTE Take by mouth.    ? SUMAtriptan (IMITREX) 50 MG tablet Take 1 tablet (50 mg total) by mouth every 2 (two) hours as needed for migraine. May repeat in 2 hours if headache persists or recurs. 10 tablet 0  ? ?No current facility-administered medications on file prior to visit.  ? ? ?No Known Allergies ? ?Social History:  reports that she has never smoked. She has never used smokeless tobacco. She reports that she does not drink alcohol and does not use drugs. ? ?Family History  ?Problem Relation Age of Onset  ? Heart attack Mother 42  26  deceased- also had lung disorder (? pulmonary fibrosis)  ? Lung disease Mother   ? Hypertension Father   ? Healthy Sister   ?     x 1  ? Diabetes Sister 52  ?     type II  ? Hypertension Sister   ? Heart disease Maternal Aunt   ?     heart disease  ? Cardiomyopathy Maternal Aunt   ? Cardiomyopathy Maternal Aunt   ? Cardiomyopathy Maternal Aunt   ? Heart disease Maternal Grandfather   ? Cardiomyopathy Maternal Grandfather   ? Breast cancer Paternal Grandmother   ? Diabetes Paternal Grandfather   ? Prostate cancer Paternal Grandfather   ? Heart disease Cousin   ? Colon polyps Neg Hx   ? Colon cancer Neg Hx   ? Esophageal cancer Neg Hx   ? Stomach cancer Neg Hx   ? Rectal cancer Neg Hx   ? ? ?The following portions of the patient's history were reviewed and updated as appropriate: allergies, current medications, past family history, past medical history, past social history, past surgical history and problem list. ? ?Review of Systems ?Pertinent items noted in HPI and remainder of  comprehensive ROS otherwise negative. ? ?Physical Exam:  ?BP 122/74   Pulse 67   Wt 237 lb (107.5 kg)   LMP 01/27/2022   BMI 38.25 kg/m?  ?CONSTITUTIONAL: Well-developed, well-nourished female in no acute distress.  ?HENT:  Normocephalic, atraumatic, External right and left ear normal.  ?EYES: Conjunctivae and EOM are normal. Pupils are equal, round, and reactive to light. No scleral icterus.  ?NECK: Normal range of motion, supple, no masses.  Normal thyroid.  ?SKIN: Skin is warm and dry. No rash noted. Not diaphoretic. No erythema. No pallor. ?MUSCULOSKELETAL: Normal range of motion. No tenderness.  No cyanosis, clubbing, or edema. ?NEUROLOGIC: Alert and oriented to person, place, and time. Normal reflexes, muscle tone coordination.  ?PSYCHIATRIC: Normal mood and affect. Normal behavior. Normal judgment and thought content. ?CARDIOVASCULAR: Normal heart rate noted, regular rhythm ?RESPIRATORY: Clear to auscultation bilaterally. Effort and breath sounds normal, no problems with respiration noted. ?BREASTS: Symmetric in size. No masses, tenderness, skin changes, nipple drainage, or lymphadenopathy bilaterally. Performed in the presence of a chaperone. ?ABDOMEN: Soft, no distention noted.  No tenderness, rebound or guarding.  ?PELVIC: Normal appearing external genitalia and urethral meatus; normal appearing vaginal mucosa and cervix.  No abnormal vaginal discharge noted.  Pap smear obtained.  Normal uterine size, no other palpable masses, no uterine or adnexal tenderness.  Performed in the presence of a chaperone. ?  ?Assessment and Plan:  ?   ?1. Well woman exam with routine gynecological exam ?- Cytology - PAP( [place]) ?Will follow up results of pap smear and manage accordingly. ?Mammogram is up to date ?Colonoscopy already scheduled ?Recommended water based hypoallergenic vaginal lubrication as needed for now, if worsens, may need further intervention.  ?Routine preventative health maintenance measures  emphasized. ?Please refer to After Visit Summary for other counseling recommendations.  ?   ? ?Verita Schneiders, MD, FACOG ?Obstetrician Social research officer, government, Faculty Practice ?[facility] [place] ? ?

## 2022-02-23 LAB — CYTOLOGY - PAP
Comment: NEGATIVE
Diagnosis: NEGATIVE
High risk HPV: NEGATIVE

## 2022-02-24 ENCOUNTER — Encounter: Payer: Self-pay | Admitting: Gastroenterology

## 2022-02-24 ENCOUNTER — Other Ambulatory Visit: Payer: Self-pay

## 2022-02-24 ENCOUNTER — Ambulatory Visit (AMBULATORY_SURGERY_CENTER): Payer: Managed Care, Other (non HMO) | Admitting: Gastroenterology

## 2022-02-24 VITALS — BP 103/59 | HR 72 | Temp 97.5°F | Resp 16 | Ht 66.0 in | Wt 235.0 lb

## 2022-02-24 DIAGNOSIS — Z1211 Encounter for screening for malignant neoplasm of colon: Secondary | ICD-10-CM

## 2022-02-24 DIAGNOSIS — K5730 Diverticulosis of large intestine without perforation or abscess without bleeding: Secondary | ICD-10-CM

## 2022-02-24 DIAGNOSIS — K635 Polyp of colon: Secondary | ICD-10-CM | POA: Diagnosis not present

## 2022-02-24 DIAGNOSIS — D125 Benign neoplasm of sigmoid colon: Secondary | ICD-10-CM

## 2022-02-24 MED ORDER — SODIUM CHLORIDE 0.9 % IV SOLN: 500.0000 mL | Freq: Once | INTRAVENOUS | Status: DC

## 2022-02-24 NOTE — Progress Notes (Signed)
PT taken to PACU. Monitors in place. VSS. Report given to RN. 

## 2022-02-24 NOTE — Progress Notes (Signed)
Pt's states no medical or surgical changes since previsit or office visit. 

## 2022-02-24 NOTE — Patient Instructions (Signed)
Resume previous diet and medications. Awaiting pathology results. Repeat Colonoscopy date to be determined based on pathology. ? ?YOU HAD AN ENDOSCOPIC PROCEDURE TODAY AT [facility]:   Refer to the procedure report that was given to you for any specific questions about what was found during the examination.  If the procedure report does not answer your questions, please call your gastroenterologist to clarify.  If you requested that your care partner not be given the details of your procedure findings, then the procedure report has been included in a sealed envelope for you to review at your convenience later. ? ?YOU SHOULD EXPECT: Some feelings of bloating in the abdomen. Passage of more gas than usual.  Walking can help get rid of the air that was put into your GI tract during the procedure and reduce the bloating. If you had a lower endoscopy (such as a colonoscopy or flexible sigmoidoscopy) you may notice spotting of blood in your stool or on the toilet paper. If you underwent a bowel prep for your procedure, you may not have a normal bowel movement for a few days. ? ?Please Note:  You might notice some irritation and congestion in your nose or some drainage.  This is from the oxygen used during your procedure.  There is no need for concern and it should clear up in a day or so. ? ?SYMPTOMS TO REPORT IMMEDIATELY: ? ?Following lower endoscopy (colonoscopy or flexible sigmoidoscopy): ? Excessive amounts of blood in the stool ? Significant tenderness or worsening of abdominal pains ? Swelling of the abdomen that is new, acute ? Fever of 100?F or higher ? ?For urgent or emergent issues, a gastroenterologist can be reached at any hour by calling [phone]. ?Do not use MyChart messaging for urgent concerns.  ? ? ?DIET:  We do recommend a small meal at first, but then you may proceed to your regular diet.  Drink plenty of fluids but you should avoid alcoholic beverages for 24 hours. ? ?ACTIVITY:   You should plan to take it easy for the rest of today and you should NOT DRIVE or use heavy machinery until tomorrow (because of the sedation medicines used during the test).   ? ?FOLLOW UP: ?Our staff will call the number listed on your records 48-72 hours following your procedure to check on you and address any questions or concerns that you may have regarding the information given to you following your procedure. If we do not reach you, we will leave a message.  We will attempt to reach you two times.  During this call, we will ask if you have developed any symptoms of COVID 19. If you develop any symptoms (ie: fever, flu-like symptoms, shortness of breath, cough etc.) before then, please call [phone].  If you test positive for Covid 19 in the 2 weeks post procedure, please call and report this information to [place].   ? ?If any biopsies were taken you will be contacted by phone or by letter within the next 1-3 weeks.  Please call us at [phone] if you have not heard about the biopsies in 3 weeks.  ? ? ?SIGNATURES/CONFIDENTIALITY: ?You and/or your care partner have signed paperwork which will be entered into your electronic medical record.  These signatures attest to the fact that that the information above on your After Visit Summary has been reviewed and is understood.  Full responsibility of the confidentiality of this discharge information lies with you and/or your care-partner.  ?

## 2022-02-24 NOTE — Op Note (Signed)
[facility] ?Patient Name: Dana Carpenter ?Procedure Date: 02/24/2022 8:49 AM ?MRN: [id number] ?Endoscopist: Gerrit Heck , MD ?Age: 46 ?Referring MD:  ?Date of Birth: 05/15/1976 ?Gender: Female ?Account #: [id number] ?Procedure:                Colonoscopy ?Indications:              Screening for colorectal malignant neoplasm, This  ?                          is the patient's first colonoscopy ?Medicines:                Monitored Anesthesia Care ?Procedure:                Pre-Anesthesia Assessment: ?                          - Prior to the procedure, a History and Physical  ?                          was performed, and patient medications and  ?                          allergies were reviewed. The patient's tolerance of  ?                          previous anesthesia was also reviewed. The risks  ?                          and benefits of the procedure and the sedation  ?                          options and risks were discussed with the patient.  ?                          All questions were answered, and informed consent  ?                          was obtained. Prior Anticoagulants: The patient has  ?                          taken no previous anticoagulant or antiplatelet  ?                          agents. ASA Grade Assessment: II - A patient with  ?                          mild systemic disease. After reviewing the risks  ?                          and benefits, the patient was deemed in  ?                          satisfactory condition to undergo the procedure. ?  After obtaining informed consent, the colonoscope  ?                          was passed under direct vision. Throughout the  ?                          procedure, the patient's blood pressure, pulse, and  ?                          oxygen saturations were monitored continuously. The  ?                          CF [id number] #[id number] was introduced through the anus  ?                          and advanced to the the  terminal ileum. The  ?                          colonoscopy was performed without difficulty. The  ?                          patient tolerated the procedure well. The quality  ?                          of the bowel preparation was good. The terminal  ?                          ileum, ileocecal valve, appendiceal orifice, and  ?                          rectum were photographed. ?Scope In: 9:08:58 AM ?Scope Out: 9:32:09 AM ?Scope Withdrawal Time: 0 hours 19 minutes 15 seconds  ?Total Procedure Duration: 0 hours 23 minutes 11 seconds  ?Findings:                 The perianal and digital rectal examinations were  ?                          normal. ?                          Three sessile polyps were found in the  ?                          recto-sigmoid colon and sigmoid colon. The polyps  ?                          were 2 to 3 mm in size. These polyps were removed  ?                          with a cold snare. Resection and retrieval were  ?                          complete. Estimated blood loss was minimal. ?  A 8 mm polyp was found in the sigmoid colon. The  ?                          polyp was sessile. The polyp was removed with a  ?                          cold snare. Resection and retrieval were complete.  ?                          Estimated blood loss was minimal. ?                          A few small and large-mouthed diverticula were  ?                          found in the sigmoid colon. ?                          The retroflexed view of the distal rectum and anal  ?                          verge was normal and showed no anal or rectal  ?                          abnormalities. ?                          The terminal ileum appeared normal. ?Complications:            No immediate complications. ?Estimated Blood Loss:     Estimated blood loss was minimal. ?Impression:               - Three 2 to 3 mm polyps at the recto-sigmoid colon  ?                          and in the sigmoid  colon, removed with a cold  ?                          snare. Resected and retrieved. ?                          - One 8 mm polyp in the sigmoid colon, removed with  ?                          a cold snare. Resected and retrieved. ?                          - Diverticulosis in the sigmoid colon. ?                          - The distal rectum and anal verge are normal on  ?                          retroflexion view. ?                          -  The examined portion of the ileum was normal. ?Recommendation:           - Patient has a contact number available for  ?                          emergencies. The signs and symptoms of potential  ?                          delayed complications were discussed with the  ?                          patient. Return to normal activities tomorrow.  ?                          Written discharge instructions were provided to the  ?                          patient. ?                          - Resume previous diet. ?                          - Continue present medications. ?                          - Await pathology results. ?                          - Repeat colonoscopy for surveillance based on  ?                          pathology results. ?                          - Return to GI office PRN. ?Gerrit Heck, MD ?02/24/2022 9:36:38 AM ?

## 2022-02-24 NOTE — Progress Notes (Signed)
? ?GASTROENTEROLOGY PROCEDURE H&P NOTE  ? ?Primary Care Physician: ?[name], NP ? ? ? ?Reason for Procedure:  Colon Cancer screening ? ?Plan:    Colonoscopy ? ?Patient is appropriate for endoscopic procedure(s) in the ambulatory ([facility]) setting. ? ?The nature of the procedure, as well as the risks, benefits, and alternatives were carefully and thoroughly reviewed with the patient. Ample time for discussion and questions allowed. The patient understood, was satisfied, and agreed to proceed.  ? ? ? ?HPI: ?Dana Carpenter is a 46 y.o. female who presents for colonoscopy for routine Colon Cancer screening.  No active GI symptoms.  No known family history of colon cancer or related malignancy.  Patient is otherwise without complaints or active issues today. ? ?Past Medical History:  ?Diagnosis Date  ? Anxiety   ? Depression   ? PE (pulmonary embolism) 03/26/2013  ? RT LOWER LOBE  ? Strep pharyngitis   ? ? ?Past Surgical History:  ?Procedure Laterality Date  ? DIAGNOSTIC LAPAROSCOPY WITH REMOVAL OF ECTOPIC PREGNANCY N/A 01/27/2017  ? Procedure: DIAGNOSTIC LAPAROSCOPY WITH RIGHT SALPINGECTOMY FOR RIGHT ECTOPIC PREGNANCY;  Surgeon: Chancy Milroy, MD;  Location: [facility];  Service: Gynecology;  Laterality: N/A;  ? WISDOM TOOTH EXTRACTION Bilateral   ? ? ?Prior to Admission medications   ?Medication Sig Start Date End Date Taking? Authorizing Provider  ?hydrOXYzine (VISTARIL) 25 MG capsule Take 1 capsule (25 mg total) by mouth at bedtime as needed. 01/13/22  Yes [name], NP  ?Multiple Vitamin (MULTIVITAMIN) tablet Take 1 tablet by mouth daily.   Yes [provider]  ?ondansetron (ZOFRAN) 4 MG tablet Take 1 tablet (4 mg total) by mouth every 6 (six) hours. 02/11/22  Yes Curatolo, Adam, DO  ?pantoprazole (PROTONIX) 40 MG tablet Take 1 tablet (40 mg total) by mouth daily. 02/08/22  Yes [name], NP  ?Probiotic Product (PROBIOTIC-10 PO) Take by mouth.   Yes [provider]   ?promethazine (PHENERGAN) 25 MG tablet Take 1 tablet (25 mg total) by mouth every 6 (six) hours as needed for nausea or vomiting. 02/15/22  Yes Terrilyn Saver, NP  ?SUMAtriptan (IMITREX) 50 MG tablet Take 1 tablet (50 mg total) by mouth every 2 (two) hours as needed for migraine. May repeat in 2 hours if headache persists or recurs. 01/13/22  Yes [name], NP  ?DULoxetine (CYMBALTA) 60 MG capsule Take 1 capsule (60 mg total) by mouth daily. ?Patient not taking: Reported on 02/24/2022 08/18/21   [name], NP  ?SODIUM FLUORIDE 5000 PPM 1.1 % PSTE Take by mouth. 01/21/22   [provider]  ? ? ?Current Outpatient Medications  ?Medication Sig Dispense Refill  ? hydrOXYzine (VISTARIL) 25 MG capsule Take 1 capsule (25 mg total) by mouth at bedtime as needed. 30 capsule 3  ? Multiple Vitamin (MULTIVITAMIN) tablet Take 1 tablet by mouth daily.    ? ondansetron (ZOFRAN) 4 MG tablet Take 1 tablet (4 mg total) by mouth every 6 (six) hours. 12 tablet 0  ? pantoprazole (PROTONIX) 40 MG tablet Take 1 tablet (40 mg total) by mouth daily. 90 tablet 1  ? Probiotic Product (PROBIOTIC-10 PO) Take by mouth.    ? promethazine (PHENERGAN) 25 MG tablet Take 1 tablet (25 mg total) by mouth every 6 (six) hours as needed for nausea or vomiting. 30 tablet 2  ? SUMAtriptan (IMITREX) 50 MG tablet Take 1 tablet (50 mg total) by mouth every 2 (two) hours as needed for migraine. May repeat in 2  hours if headache persists or recurs. 10 tablet 0  ? DULoxetine (CYMBALTA) 60 MG capsule Take 1 capsule (60 mg total) by mouth daily. (Patient not taking: Reported on 02/24/2022) 90 capsule 1  ? SODIUM FLUORIDE 5000 PPM 1.1 % PSTE Take by mouth.    ? ?Current Facility-Administered Medications  ?Medication Dose Route Frequency Provider Last Rate Last Admin  ? 0.9 %  sodium chloride infusion  500 mL Intravenous Once Yatziri Wainwright V, DO      ? ? ?Allergies as of 02/24/2022  ? (No Known Allergies)  ? ? ?Family History  ?Problem  Relation Age of Onset  ? Heart attack Mother 51  ?     deceased- also had lung disorder (? pulmonary fibrosis)  ? Lung disease Mother   ? Hypertension Father   ? Healthy Sister   ?     x 1  ? Diabetes Sister 75  ?     type II  ? Hypertension Sister   ? Heart disease Maternal Aunt   ?     heart disease  ? Cardiomyopathy Maternal Aunt   ? Cardiomyopathy Maternal Aunt   ? Cardiomyopathy Maternal Aunt   ? Heart disease Maternal Grandfather   ? Cardiomyopathy Maternal Grandfather   ? Breast cancer Paternal Grandmother   ? Diabetes Paternal Grandfather   ? Prostate cancer Paternal Grandfather   ? Heart disease Cousin   ? Colon polyps Neg Hx   ? Colon cancer Neg Hx   ? Esophageal cancer Neg Hx   ? Stomach cancer Neg Hx   ? Rectal cancer Neg Hx   ? ? ?Social History  ? ?Socioeconomic History  ? Marital status: Single  ?  Spouse name: Not on file  ? Number of children: Not on file  ? Years of education: Not on file  ? Highest education level: Not on file  ?Occupational History  ? Not on file  ?Tobacco Use  ? Smoking status: Never  ? Smokeless tobacco: Never  ? Tobacco comments:  ?  never used tobacco  ?Vaping Use  ? Vaping Use: Never used  ?Substance and Sexual Activity  ? Alcohol use: No  ?  Alcohol/week: 0.0 standard drinks  ?  Comment: occasional  ? Drug use: No  ? Sexual activity: Not on file  ?Other Topics Concern  ? Not on file  ?Social History Narrative  ? Lives with 2 sons [name] 2008, Linn 2011  ? Older son born 18- in and out of the home  ? Boyfriend, but single  ? Works in [employer]  ? Enjoys sleeping.  ? Completed some college  ? Sister lives locally, most of her family in [place] and has 2 aunts in [place]  ? ?Social Determinants of Health  ? ?Financial Resource Strain: Not on file  ?Food Insecurity: Not on file  ?Transportation Needs: Not on file  ?Physical Activity: Not on file  ?Stress: Not on file  ?Social Connections: Not on file  ?Intimate Partner Violence: Not on file   ? ? ?Physical Exam: ?Vital signs in last 24 hours: ?'@BP'$  105/64   Pulse 81   Temp (!) 97.5 ?F (36.4 ?C) (Skin)   Ht '5\' 6"'$  (1.676 m)   Wt 235 lb (106.6 kg)   LMP 01/27/2022   SpO2 99%   BMI 37.93 kg/m?  ?GEN: NAD ?EYE: Sclerae anicteric ?ENT: MMM ?CV: Non-tachycardic ?Pulm: CTA b/l ?GI: Soft, NT/ND ?NEURO:  Alert & Oriented x 3 ? ? ?[employer]  Ted Goodner, DO ?[facility] ? ? ?02/24/2022 9:01 AM ? ?

## 2022-02-24 NOTE — Progress Notes (Signed)
Called to room to assist during endoscopic procedure.  Patient ID and intended procedure confirmed with present staff. Received instructions for my participation in the procedure from the performing physician.  

## 2022-02-26 ENCOUNTER — Telehealth: Payer: Self-pay | Admitting: *Deleted

## 2022-02-26 NOTE — Telephone Encounter (Signed)
?  Follow up Call- ? ? ?  02/24/2022  ?  8:33 AM  ?Call back number  ?Post procedure Call Back phone  # [phone]  ?Permission to leave phone message Yes  ?  ? ?Patient questions: ? ?Do you have a fever, pain , or abdominal swelling? No. ?Pain Score  0 * ? ?Have you tolerated food without any problems? Yes.   ? ?Have you been able to return to your normal activities? Yes.   ? ?Do you have any questions about your discharge instructions: ?Diet   No. ?Medications  No. ?Follow up visit  No. ? ?Do you have questions or concerns about your Care? No. ? ?Actions: ?* If pain score is 4 or above: ?No action needed, pain <4. ? ? ?

## 2022-03-02 ENCOUNTER — Encounter: Payer: Self-pay | Admitting: Gastroenterology

## 2022-04-23 ENCOUNTER — Other Ambulatory Visit (HOSPITAL_BASED_OUTPATIENT_CLINIC_OR_DEPARTMENT_OTHER): Payer: Self-pay

## 2022-07-14 ENCOUNTER — Encounter (INDEPENDENT_AMBULATORY_CARE_PROVIDER_SITE_OTHER): Payer: Self-pay

## 2022-07-15 ENCOUNTER — Other Ambulatory Visit (HOSPITAL_BASED_OUTPATIENT_CLINIC_OR_DEPARTMENT_OTHER): Payer: Self-pay

## 2022-07-15 ENCOUNTER — Other Ambulatory Visit: Payer: Self-pay | Admitting: Family

## 2022-07-15 ENCOUNTER — Other Ambulatory Visit (HOSPITAL_COMMUNITY): Payer: Self-pay

## 2022-07-15 MED ORDER — SUMATRIPTAN SUCCINATE 50 MG PO TABS
50.0000 mg | ORAL_TABLET | ORAL | 0 refills | Status: DC | PRN
  Filled 2022-07-15: qty 9, 28d supply, fill #0

## 2022-07-26 ENCOUNTER — Other Ambulatory Visit (HOSPITAL_COMMUNITY): Payer: Self-pay

## 2022-08-11 ENCOUNTER — Ambulatory Visit: Payer: Managed Care, Other (non HMO) | Admitting: Family

## 2022-09-10 ENCOUNTER — Other Ambulatory Visit (HOSPITAL_BASED_OUTPATIENT_CLINIC_OR_DEPARTMENT_OTHER): Payer: Self-pay

## 2022-12-08 ENCOUNTER — Encounter: Payer: Self-pay | Admitting: Medical

## 2022-12-08 ENCOUNTER — Ambulatory Visit: Payer: Managed Care, Other (non HMO) | Admitting: Medical

## 2022-12-08 VITALS — BP 138/80 | HR 98 | Temp 98.5°F | Resp 18 | Ht 66.0 in | Wt 230.0 lb

## 2022-12-08 DIAGNOSIS — M940 Chondrocostal junction syndrome [Tietze]: Secondary | ICD-10-CM

## 2022-12-08 DIAGNOSIS — J069 Acute upper respiratory infection, unspecified: Secondary | ICD-10-CM

## 2022-12-08 DIAGNOSIS — R059 Cough, unspecified: Secondary | ICD-10-CM | POA: Diagnosis not present

## 2022-12-08 MED ORDER — HYDROCODONE BIT-HOMATROP MBR 5-1.5 MG/5ML PO SOLN: 5.0000 mL | Freq: Three times a day (TID) | ORAL | 0 refills | Status: DC | PRN

## 2022-12-08 NOTE — Patient Instructions (Signed)
Uri early with costochondritis. For severe cough keeping you up at night rx hycodan. Rx advisement given. For costochondritis can use combo low dose tylenol and low dose ibuprofen.  If any wheezing let us know. If your gerd/ refux flares can add on pepcid otc to your protonix.  If worse productive cough or chest congestion let us know. In that event rx antibiotic. Not indicated presently.  Follow up in 7-10 days or sooner if needed.

## 2022-12-08 NOTE — Progress Notes (Addendum)
Subjective:    Patient ID: Dana Carpenter, female    DOB: 1976/02/18, 47 y.o.   MRN: [id number]  HPI  Pt in with 3 days of illness. Initially had tickle in throat. Then next day had onset severe cough that is productive cough. When she coughs she states chest hurts for a second and then subsides.    Pt has some cold chills and body aches waiting in room. Pt did covid test and was negative.   Pt has been coughing a lot at night. Cough at times so severe can't talk.   Pt has some reflux history. No recent flare of reflux.  No wheezing.   Review of Systems  Constitutional:  Negative for chills, fatigue and fever.  HENT:  Negative for congestion.   Respiratory:  Positive for cough. Negative for shortness of breath and wheezing.   Cardiovascular:  Negative for chest pain and palpitations.  Gastrointestinal:  Negative for abdominal pain.  Musculoskeletal:  Negative for back pain and myalgias.       See hpi.  Neurological:  Negative for dizziness, weakness, numbness and headaches.  Hematological:  Negative for adenopathy. Does not bruise/bleed easily.  Psychiatric/Behavioral:  Negative for behavioral problems and confusion.    Past Medical History:  Diagnosis Date   Anxiety    Depression    PE (pulmonary embolism) 03/26/2013   RT LOWER LOBE   Strep pharyngitis      Social History   Socioeconomic History   Marital status: Single    Spouse name: Not on file   Number of children: Not on file   Years of education: Not on file   Highest education level: Not on file  Occupational History   Not on file  Tobacco Use   Smoking status: Never   Smokeless tobacco: Never   Tobacco comments:    never used tobacco  Vaping Use   Vaping Use: Never used  Substance and Sexual Activity   Alcohol use: No    Alcohol/week: 0.0 standard drinks of alcohol    Comment: occasional   Drug use: No   Sexual activity: Not on file  Other Topics Concern   Not on file  Social History  Narrative   Lives with 2 sons [name] 2008, [name] 2011   Older son born 21- in and out of the home   Boyfriend, but single   Works in [employer]   Enjoys sleeping.   Completed some college   Sister lives locally, most of her family in [place] and has 2 aunts in [place]   Social Determinants of Health   Financial Resource Strain: Not on file  Food Insecurity: Not on file  Transportation Needs: Not on file  Physical Activity: Not on file  Stress: Not on file  Social Connections: Not on file  Intimate Partner Violence: Not on file    Past Surgical History:  Procedure Laterality Date   DIAGNOSTIC LAPAROSCOPY WITH REMOVAL OF ECTOPIC PREGNANCY N/A 01/27/2017   Procedure: DIAGNOSTIC LAPAROSCOPY WITH RIGHT SALPINGECTOMY FOR RIGHT ECTOPIC PREGNANCY;  Surgeon: Chancy Milroy, MD;  Location: [facility];  Service: Gynecology;  Laterality: N/A;   WISDOM TOOTH EXTRACTION Bilateral     Family History  Problem Relation Age of Onset   Heart attack Mother 36       deceased- also had lung disorder (? pulmonary fibrosis)   Lung disease Mother    Hypertension Father    Healthy Sister  x 1   Diabetes Sister 66       type II   Hypertension Sister    Heart disease Maternal Aunt        heart disease   Cardiomyopathy Maternal Aunt    Cardiomyopathy Maternal Aunt    Cardiomyopathy Maternal Aunt    Heart disease Maternal Grandfather    Cardiomyopathy Maternal Grandfather    Breast cancer Paternal Grandmother    Diabetes Paternal Grandfather    Prostate cancer Paternal Grandfather    Heart disease Cousin    Colon polyps Neg Hx    Colon cancer Neg Hx    Esophageal cancer Neg Hx    Stomach cancer Neg Hx    Rectal cancer Neg Hx     No Known Allergies  Current Outpatient Medications on File Prior to Visit  Medication Sig Dispense Refill   DULoxetine (CYMBALTA) 60 MG capsule Take 1 capsule (60 mg total) by mouth daily. 90 capsule 1   hydrOXYzine (VISTARIL) 25  MG capsule Take 1 capsule (25 mg total) by mouth at bedtime as needed. 30 capsule 3   Multiple Vitamin (MULTIVITAMIN) tablet Take 1 tablet by mouth daily.     ondansetron (ZOFRAN) 4 MG tablet Take 1 tablet (4 mg total) by mouth every 6 (six) hours. 12 tablet 0   pantoprazole (PROTONIX) 40 MG tablet Take 1 tablet (40 mg total) by mouth daily. 90 tablet 1   Probiotic Product (PROBIOTIC-10 PO) Take by mouth.     promethazine (PHENERGAN) 25 MG tablet Take 1 tablet (25 mg total) by mouth every 6 (six) hours as needed for nausea or vomiting. 30 tablet 2   SODIUM FLUORIDE 5000 PPM 1.1 % PSTE Take by mouth.     SUMAtriptan (IMITREX) 50 MG tablet Take 1 tablet by mouth every 2 hours as needed for migraine. May repeat in 2 hours if headache persists or recurs. 10 tablet 0   No current facility-administered medications on file prior to visit.    BP 138/80   Pulse 98   Temp 98.5 F (36.9 C)   Resp 18   Ht '5\' 6"'$  (1.676 m)   Wt 230 lb (104.3 kg)   SpO2 99%   BMI 37.12 kg/m           Objective:   Physical Exam  General- No acute distress. Pleasant patient. Neck- Full range of motion, no jvd Lungs- Clear, even and unlabored. Heart- regular rate and rhythm. Neurologic- CNII- XII grossly intact.   Heent- no sinus pressure to palpation. +pnd. Ears- canals clear and normal tm.  Anterior thorax- left side reproducible costochandral junction tender to palpation.      Assessment & Plan:   Patient Instructions  Samuel [place] early with costochondritis. For severe cough keeping you up at night rx hycodan. Rx advisement given. For costochondritis can use combo low dose tylenol and low dose ibuprofen.  If any wheezing let us know. If your gerd/ refux flares can add on pepcid otc to your protonix.  If worse productive cough or chest congestion let us know. In that event rx antibiotic. Not indicated presently.  Follow up in 7-10 days or sooner if needed.   Mackie Pai, PA-C

## 2022-12-09 ENCOUNTER — Telehealth: Payer: Self-pay | Admitting: Family

## 2022-12-09 ENCOUNTER — Other Ambulatory Visit: Payer: Self-pay | Admitting: Family

## 2022-12-09 MED ORDER — AZITHROMYCIN 250 MG PO TABS: ORAL_TABLET | ORAL | 0 refills | Status: AC

## 2022-12-09 NOTE — Telephone Encounter (Signed)
Pt called stating that her symptoms have worsened with chills and wanted to let Percell Miller know. Please Advise.

## 2022-12-09 NOTE — Addendum Note (Signed)
Addended by: Anabel Halon on: 12/09/2022 05:59 PM   Modules accepted: Orders

## 2022-12-10 ENCOUNTER — Other Ambulatory Visit (HOSPITAL_BASED_OUTPATIENT_CLINIC_OR_DEPARTMENT_OTHER): Payer: Self-pay

## 2022-12-10 ENCOUNTER — Other Ambulatory Visit: Payer: Self-pay | Admitting: Medical

## 2022-12-10 MED ORDER — PANTOPRAZOLE SODIUM 40 MG PO TBEC
40.0000 mg | DELAYED_RELEASE_TABLET | Freq: Every day | ORAL | 1 refills | Status: DC
  Filled 2022-12-10 – 2022-12-28 (×2): qty 90, 90d supply, fill #0
  Filled 2023-02-22: qty 90, 90d supply, fill #1

## 2022-12-10 NOTE — Telephone Encounter (Signed)
Pt called and advised of medication sent in and to call for a follow up if symptoms have not improved.

## 2022-12-22 ENCOUNTER — Other Ambulatory Visit (HOSPITAL_BASED_OUTPATIENT_CLINIC_OR_DEPARTMENT_OTHER): Payer: Self-pay

## 2022-12-28 ENCOUNTER — Other Ambulatory Visit (HOSPITAL_BASED_OUTPATIENT_CLINIC_OR_DEPARTMENT_OTHER): Payer: Self-pay

## 2022-12-28 MED ORDER — LINZESS 145 MCG PO CAPS
145.0000 ug | ORAL_CAPSULE | Freq: Every day | ORAL | 3 refills | Status: DC
  Filled 2022-12-28: qty 90, 90d supply, fill #0
  Filled 2023-03-29 – 2023-08-02 (×3): qty 90, 90d supply, fill #1

## 2023-01-03 ENCOUNTER — Ambulatory Visit: Payer: Managed Care, Other (non HMO) | Admitting: Obstetrics and Gynecology

## 2023-01-03 ENCOUNTER — Other Ambulatory Visit (HOSPITAL_COMMUNITY)
Admission: RE | Admit: 2023-01-03 | Discharge: 2023-01-03 | Disposition: A | Discharge status: Home / Self Care | Payer: Managed Care, Other (non HMO) | Source: Ambulatory Visit | Attending: Obstetrics and Gynecology | Admitting: Obstetrics and Gynecology

## 2023-01-03 VITALS — BP 122/76 | HR 66 | Ht 66.0 in | Wt 228.0 lb

## 2023-01-03 DIAGNOSIS — N898 Other specified noninflammatory disorders of vagina: Secondary | ICD-10-CM | POA: Insufficient documentation

## 2023-01-03 DIAGNOSIS — N939 Abnormal uterine and vaginal bleeding, unspecified: Secondary | ICD-10-CM | POA: Diagnosis present

## 2023-01-03 DIAGNOSIS — Z01812 Encounter for preprocedural laboratory examination: Secondary | ICD-10-CM

## 2023-01-03 LAB — POCT URINE PREGNANCY: Preg Test, Ur: NEGATIVE

## 2023-01-03 MED ORDER — MEGESTROL ACETATE 40 MG PO TABS: 40.0000 mg | ORAL_TABLET | Freq: Every day | ORAL | 6 refills | Status: DC

## 2023-01-03 NOTE — Progress Notes (Signed)
GYNECOLOGY VISIT  Patient name: Dana Carpenter MRN [id number]  Date of birth: 1976-05-20 Chief Complaint:   Menstrual Problem   History:  Dana Carpenter is a 47 y.o. [id number] being seen today for abnormal uterine bleeding.  No hot flashes at present. Heavier flow for the last year with bad cramps. Using pads, changing every 1.5-2 hr and filling them; previously changing every 2 hours but not full. Cramping with menses now - no medicatios tried. No bleeding with intercoures no bleeding between cycles. Hx of PE and on blood thinner for 1 year and none since then. No more children - not currently on any contraception. Started using ky jelly. Mom passed early so unsure when she would have been in menopause. Spontaneous PE in 2014 - not w/in pregnany or anything, unknown cause of PE.   Past Medical History:  Diagnosis Date   Anxiety    Depression    PE (pulmonary embolism) 03/26/2013   RT LOWER LOBE   Strep pharyngitis     Past Surgical History:  Procedure Laterality Date   DIAGNOSTIC LAPAROSCOPY WITH REMOVAL OF ECTOPIC PREGNANCY N/A 01/27/2017   Procedure: DIAGNOSTIC LAPAROSCOPY WITH RIGHT SALPINGECTOMY FOR RIGHT ECTOPIC PREGNANCY;  Surgeon: Chancy Milroy, MD;  Location: [facility];  Service: Gynecology;  Laterality: N/A;   WISDOM TOOTH EXTRACTION Bilateral     The following portions of the patient's history were reviewed and updated as appropriate: allergies, current medications, past family history, past medical history, past social history, past surgical history and problem list.   Health Maintenance:   Last pap     Component Value Date/Time   DIAGPAP  02/22/2022 0830    - Negative for intraepithelial lesion or malignancy (NILM)   DIAGPAP  07/06/2018 0000    NEGATIVE FOR INTRAEPITHELIAL LESIONS OR MALIGNANCY.   [place] Negative 02/22/2022 0830   ADEQPAP  02/22/2022 0830    Satisfactory for evaluation; transformation zone component PRESENT.   ADEQPAP  07/06/2018 0000     Satisfactory for evaluation  endocervical/transformation zone component PRESENT.     Last mammogram: 02/2022 birads 1   Review of Systems:  Pertinent items are noted in HPI. Comprehensive review of systems was otherwise negative.   Objective:  Physical Exam BP 122/76   Pulse 66   Ht '5\' 6"'$  (1.676 m)   Wt 228 lb (103.4 kg)   LMP 01/03/2023   BMI 36.80 kg/m    Physical Exam Vitals and nursing note reviewed. Exam conducted with a chaperone present.  Constitutional:      Appearance: Normal appearance.  HENT:     Head: Normocephalic and atraumatic.  Pulmonary:     Effort: Pulmonary effort is normal.     Breath sounds: Normal breath sounds.  Genitourinary:    General: Normal vulva.     Exam position: Lithotomy position.     Vagina: Normal.     Cervix: Normal.     Comments: Multiparous cervix  Skin:    General: Skin is warm and dry.  Neurological:     General: No focal deficit present.     Mental Status: She is alert.  Psychiatric:        Mood and Affect: Mood normal.        Behavior: Behavior normal.        Thought Content: Thought content normal.        Judgment: Judgment normal.     Endometrial Biopsy Procedure  Patient identified, informed consent performed,  indication reviewed, consent  signed.  Reviewed risk of perforation, pain, bleeding, insufficient sample, etc were reviewd. Time out was performed.  Urine pregnancy test negative.  Speculum placed in the vagina.  Cervix visualized.  Cleaned with Betadine x 2.  Anterior cervix infiltrated with 0.5cc of 2% lidocaine with epinephrine. Initially pipelle passed but only to about 5cm, unclear if endometrial cavity entered, and passed twice.  Grasped anteriorly with a single tooth tenaculum.  Paracervical block was administered and the endocervical canal instilled as well.  Endometrial pipelle was used to draw up 1cc of anesthetic, introduced into the cervical os and instilled into the endometrial cavity.  The pipelle was  passed one last time and sample obtained. Tenaculum was removed, good hemostasis noted.  Patient tolerated procedure well.  Patient was given post-procedure instructions.      Assessment & Plan:   1. Abnormal uterine bleeding (AUB) Labs and now s/p uncomplicated EMB. Offered medical management as workup pending, accepted megace for now. Has completed childbearing. Discussed would avoid estrogen containing product given hx of unprovoked PE.  - CBC - Follicle stimulating hormone - Hemoglobin A1c - TSH Rfx on Abnormal to Free T4 - Cervicovaginal ancillary only - US PELVIC COMPLETE WITH TRANSVAGINAL; Future - Prolactin - Surgical pathology( [place]/ [facility]) - megestrol (MEGACE) 40 MG tablet; Take 1 tablet (40 mg total) by mouth daily.  Dispense: 60 tablet; Refill: 6  2. Vaginal odor Swab collected today  - Cervicovaginal ancillary only  3. Pre-procedure lab exam - POCT urine pregnancy   Routine preventative health maintenance measures emphasized.  Darliss Cheney, MD [facility]

## 2023-01-03 NOTE — Progress Notes (Signed)
Patient states she thinks she is starting menopause. Patient having heavy periods, hair thining, and vaginal dryness. Kathrene Alu RN

## 2023-01-04 LAB — SURGICAL PATHOLOGY

## 2023-01-04 LAB — CERVICOVAGINAL ANCILLARY ONLY
Chlamydia: NEGATIVE
Comment: NEGATIVE
Comment: NEGATIVE
Comment: NORMAL
Neisseria Gonorrhea: NEGATIVE
Trichomonas: NEGATIVE

## 2023-01-05 ENCOUNTER — Ambulatory Visit: Payer: Managed Care, Other (non HMO)

## 2023-01-06 ENCOUNTER — Ambulatory Visit: Payer: Managed Care, Other (non HMO)

## 2023-01-06 ENCOUNTER — Ambulatory Visit (HOSPITAL_BASED_OUTPATIENT_CLINIC_OR_DEPARTMENT_OTHER)
Admission: RE | Admit: 2023-01-06 | Discharge: 2023-01-06 | Disposition: A | Discharge status: Home / Self Care | Payer: Managed Care, Other (non HMO) | Source: Ambulatory Visit | Attending: Obstetrics and Gynecology | Admitting: Obstetrics and Gynecology

## 2023-01-06 DIAGNOSIS — N939 Abnormal uterine and vaginal bleeding, unspecified: Secondary | ICD-10-CM

## 2023-01-07 LAB — PROLACTIN: Prolactin: 9.1 ng/mL (ref 4.8–33.4)

## 2023-01-07 LAB — CBC
Hematocrit: 41.1 % (ref 34.0–46.6)
Hemoglobin: 13.1 g/dL (ref 11.1–15.9)
MCH: 27.2 pg (ref 26.6–33.0)
MCHC: 31.9 g/dL (ref 31.5–35.7)
MCV: 85 fL (ref 79–97)
Platelets: 302 10*3/uL (ref 150–450)
RBC: 4.82 x10E6/uL (ref 3.77–5.28)
RDW: 12.8 % (ref 11.7–15.4)
WBC: 7.6 10*3/uL (ref 3.4–10.8)

## 2023-01-07 LAB — HEMOGLOBIN A1C
Est. average glucose Bld gHb Est-mCnc: 111 mg/dL
Hgb A1c MFr Bld: 5.5 % (ref 4.8–5.6)

## 2023-01-07 LAB — TSH RFX ON ABNORMAL TO FREE T4: TSH: 1.46 u[IU]/mL (ref 0.450–4.500)

## 2023-01-07 LAB — FOLLICLE STIMULATING HORMONE: FSH: 9.1 m[IU]/mL

## 2023-01-17 ENCOUNTER — Encounter: Payer: Self-pay | Admitting: Obstetrics and Gynecology

## 2023-01-17 ENCOUNTER — Ambulatory Visit: Payer: Managed Care, Other (non HMO) | Admitting: Obstetrics and Gynecology

## 2023-01-17 VITALS — BP 127/77 | HR 70 | Wt 231.0 lb

## 2023-01-17 DIAGNOSIS — N939 Abnormal uterine and vaginal bleeding, unspecified: Secondary | ICD-10-CM | POA: Diagnosis not present

## 2023-01-17 NOTE — Progress Notes (Signed)
Follow up Ultrasound  EMB done 01/03/23

## 2023-01-17 NOTE — Progress Notes (Signed)
GYNECOLOGY VISIT  Patient name: Dana Carpenter MRN [id number]  Date of birth: 06/04/1976 Chief Complaint:   Follow-up  History:  Dana Carpenter is a 47 y.o. [id number] being seen today for followup.  Has not had another menses yet. Megace side effects tolerable at this time. Interested in possible endometrial ablation.   Past Medical History:  Diagnosis Date   Anxiety    Depression    PE (pulmonary embolism) 03/26/2013   RT LOWER LOBE   Strep pharyngitis     Past Surgical History:  Procedure Laterality Date   DIAGNOSTIC LAPAROSCOPY WITH REMOVAL OF ECTOPIC PREGNANCY N/A 01/27/2017   Procedure: DIAGNOSTIC LAPAROSCOPY WITH RIGHT SALPINGECTOMY FOR RIGHT ECTOPIC PREGNANCY;  Surgeon: Chancy Milroy, MD;  Location: [facility];  Service: Gynecology;  Laterality: N/A;   WISDOM TOOTH EXTRACTION Bilateral     The following portions of the patient's history were reviewed and updated as appropriate: allergies, current medications, past family history, past medical history, past social history, past surgical history and problem list.   Health Maintenance:   Last pap     Component Value Date/Time   DIAGPAP  02/22/2022 0830    - Negative for intraepithelial lesion or malignancy (NILM)   DIAGPAP  07/06/2018 0000    NEGATIVE FOR INTRAEPITHELIAL LESIONS OR MALIGNANCY.   [place] Negative 02/22/2022 0830   ADEQPAP  02/22/2022 0830    Satisfactory for evaluation; transformation zone component PRESENT.   ADEQPAP  07/06/2018 0000    Satisfactory for evaluation  endocervical/transformation zone component PRESENT.    High Risk HPV: Positive  Adequacy:  Satisfactory for evaluation, transformation zone component PRESENT  Diagnosis:  Atypical squamous cells of undetermined significance (ASC-US)  EMB (12/2022):   FINAL MICROSCOPIC DIAGNOSIS:   A. ENDOMETRIAL BIOPSY:  - Secretory endometrium with stromal breakdown.  No hyperplasia/EIN or  malignancy identified.   Review of Systems:  Pertinent  items are noted in HPI. Comprehensive review of systems was otherwise negative.   Objective:  Physical Exam BP 127/77   Pulse 70   Wt 231 lb (104.8 kg)   LMP 01/03/2023   BMI 37.28 kg/m    Physical Exam Vitals reviewed.  Constitutional:      Appearance: Normal appearance.  HENT:     Head: Normocephalic and atraumatic.  Pulmonary:     Effort: Pulmonary effort is normal.  Neurological:     General: No focal deficit present.     Mental Status: She is alert.  Psychiatric:        Mood and Affect: Mood normal.        Behavior: Behavior normal.        Thought Content: Thought content normal.        Judgment: Judgment normal.      Labs and Imaging US PELVIC COMPLETE WITH TRANSVAGINAL  Result Date: 01/06/2023 CLINICAL DATA:  Abnormal uterine bleeding EXAM: TRANSABDOMINAL AND TRANSVAGINAL ULTRASOUND OF PELVIS TECHNIQUE: Both transabdominal and transvaginal ultrasound examinations of the pelvis were performed. Transabdominal technique was performed for global imaging of the pelvis including uterus, ovaries, adnexal regions, and pelvic cul-de-sac. It was necessary to proceed with endovaginal exam following the transabdominal exam to visualize the endometrium. COMPARISON:  CT renal stone 05/25/2021 FINDINGS: Uterus Measurements: 6.4 x 5.2 x 5.6 cm = volume: 96.6 mL. No fibroids or other mass visualized. Endometrium Thickness: 2 mm.  Small amount of fluid in the endometrial cavity. Right ovary Measurements: 2.8 x 1.6 x 1.9 cm = volume: 4.3 mL. Normal appearance/no  adnexal mass. Left ovary Measurements: 3.1 x 1.1 x 1.4 cm = volume: 2.4 mL. Normal appearance/no adnexal mass. Other findings No abnormal free fluid. IMPRESSION: Endometrium measures 2 mm. If bleeding remains unresponsive to hormonal or medical therapy, sonohysterogram should be considered for focal lesion work-up. (Ref: Radiological Reasoning: Algorithmic Workup of Abnormal Vaginal Bleeding with Endovaginal Sonography and  Sonohysterography. AJR 2008; [id number]) Small amount of fluid in the endometrial cavity, nonspecific, potentially blood products. Electronically Signed   By: Lovey Newcomer M.D.   On: 01/06/2023 13:40       Assessment & Plan:   1. Abnormal uterine bleeding (AUB) Reviewed imaging and EMB results. No menses since last visit, will continue with megace for now. Reviewed endometrial ablation and answered all questions as well. Patient would like to continue with megace for now - will follow up bleeding response over the next 3- 4 months, sooner if preferred or needed.     Routine preventative health maintenance measures emphasized.  Darliss Cheney, MD [facility]

## 2023-01-18 ENCOUNTER — Ambulatory Visit (INDEPENDENT_AMBULATORY_CARE_PROVIDER_SITE_OTHER): Payer: Managed Care, Other (non HMO) | Admitting: Family

## 2023-01-18 ENCOUNTER — Encounter: Payer: Self-pay | Admitting: Family

## 2023-01-18 VITALS — BP 133/81 | HR 104 | Temp 98.0°F | Resp 16 | Ht 66.0 in | Wt 232.0 lb

## 2023-01-18 DIAGNOSIS — N939 Abnormal uterine and vaginal bleeding, unspecified: Secondary | ICD-10-CM

## 2023-01-18 DIAGNOSIS — Z8249 Family history of ischemic heart disease and other diseases of the circulatory system: Secondary | ICD-10-CM

## 2023-01-18 DIAGNOSIS — Z0000 Encounter for general adult medical examination without abnormal findings: Secondary | ICD-10-CM

## 2023-01-18 DIAGNOSIS — H9313 Tinnitus, bilateral: Secondary | ICD-10-CM | POA: Insufficient documentation

## 2023-01-18 HISTORY — DX: Family history of ischemic heart disease and other diseases of the circulatory system: Z82.49

## 2023-01-18 HISTORY — DX: Encounter for general adult medical examination without abnormal findings: Z00.00

## 2023-01-18 HISTORY — DX: Tinnitus, bilateral: H93.13

## 2023-01-18 NOTE — Assessment & Plan Note (Signed)
She has not started megace as prescribed by GYN. She is opting towards ablation instead.

## 2023-01-18 NOTE — Assessment & Plan Note (Signed)
Wt Readings from Last 3 Encounters:  01/18/23 232 lb (105.2 kg)  01/17/23 231 lb (104.8 kg)  01/03/23 228 lb (103.4 kg)   Working hard on diet.  Work on increasing exercise.   Declines flu shot and covid shot. She will schedule mammogram  Pap up to date.

## 2023-01-18 NOTE — Progress Notes (Signed)
Subjective:   By signing my name below, I, Shehryar Baig, attest that this documentation has been prepared under the direction and in the presence of [name], NP. 01/18/2023   Patient ID: Dana Carpenter, female    DOB: Feb 05, 1976, 47 y.o.   MRN: [id number]  Chief Complaint  Patient presents with   Annual Exam    HPI Patient is in today for a comprehensive physical exam.   Tinnitus: She complains of ringing in both ears.   Worsening vision: She also complains of worsening vision and is planning on seeing her eye doctor for further evaluation.   Linzess: She is taking linzess to help her constipation.   Rash: She reports developing a non-itching rash on her left lower ankle that is not bothering her at this time.   Acute: She denies fever, unexpected weight change, adenopathy, new moles, new moles, sinus pain, sore throat, visual disturbance, chest pain, palpitations, leg swelling, cough, shortness of breath, wheezing, nausea, vomiting, diarrhea, constipation, blood in stool, dysuria, frequency, hematuria, new muscle pain, new joint pain, headaches, depression or anxiety at this time.   Social history: Her maternal female cousin was diagnosed with heart issues, otherwise she has no changes to her family medical history.   Immunizations: She does not have the flu vaccine this year and is not interested in receiving it. He is UTD on the tetanus vaccine. She does not have the latest Covid-19 vaccine.   Diet: She is managing a healthy diet. She is avoiding sugar and fats.   Exercise: She is not participating in regular exercise.   Colonoscopy: Last completed 02/24/2022. Results showed three 2-3 mm polyps at the recto-sigmoid colon and the sigmoid colon, one 8 mm polyp in the sigmoid colon, diverticulosis in the sigmoid colon, otherwise results are normal.   Pap Smear: Last completed 02/22/2022. Results are normal. Repeat in 3 years.   Mammogram: Last completed 02/03/2022.  Results are normal. Repeat in 1 year.   Dental: She is UTD on dental care.   Vision: She is UTD on vision care.    Past Medical History:  Diagnosis Date   Anxiety    Depression    PE (pulmonary embolism) 03/26/2013   RT LOWER LOBE   Strep pharyngitis     Past Surgical History:  Procedure Laterality Date   DIAGNOSTIC LAPAROSCOPY WITH REMOVAL OF ECTOPIC PREGNANCY N/A 01/27/2017   Procedure: DIAGNOSTIC LAPAROSCOPY WITH RIGHT SALPINGECTOMY FOR RIGHT ECTOPIC PREGNANCY;  Surgeon: Chancy Milroy, MD;  Location: [facility];  Service: Gynecology;  Laterality: N/A;   WISDOM TOOTH EXTRACTION Bilateral     Family History  Problem Relation Age of Onset   Heart attack Mother 75       deceased- also had lung disorder (? pulmonary fibrosis)   Lung disease Mother    Hypertension Father    Healthy Sister        x 1   Diabetes Sister 70       type II   Hypertension Sister    Dementia Maternal Grandmother        82   Heart disease Maternal Grandfather    Cardiomyopathy Maternal Grandfather    Breast cancer Paternal Grandmother    Diabetes Paternal Grandfather    Prostate cancer Paternal Grandfather    Heart disease Maternal Aunt        heart disease   Cardiomyopathy Maternal Aunt        heart transplant   Cardiomyopathy Maternal Aunt  Cardiomyopathy Maternal Aunt    Heart disease Cousin        female cousin on mom's side "weak heart"   Cardiomyopathy Cousin        had heart transplant   Colon polyps Neg Hx    Colon cancer Neg Hx    Esophageal cancer Neg Hx    Stomach cancer Neg Hx    Rectal cancer Neg Hx     Social History   Socioeconomic History   Marital status: Single    Spouse name: Not on file   Number of children: Not on file   Years of education: Not on file   Highest education level: Not on file  Occupational History   Not on file  Tobacco Use   Smoking status: Never   Smokeless tobacco: Never   Tobacco comments:    never used tobacco  Vaping Use   Vaping  Use: Never used  Substance and Sexual Activity   Alcohol use: No    Alcohol/week: 0.0 standard drinks of alcohol    Comment: occasional   Drug use: No   Sexual activity: Not on file  Other Topics Concern   Not on file  Social History Narrative   Lives with 2 sons [name] 2008, [name] 2011   Older son born 72- in and out of the home   Boyfriend, but single   Works in [employer]   Enjoys sleeping.   Completed some college   Sister lives locally, most of her family in [place] and has 2 aunts in [place]   Social Determinants of Health   Financial Resource Strain: Not on file  Food Insecurity: Not on file  Transportation Needs: Not on file  Physical Activity: Not on file  Stress: Not on file  Social Connections: Not on file  Intimate Partner Violence: Not on file    Outpatient Medications Prior to Visit  Medication Sig Dispense Refill   hydrOXYzine (VISTARIL) 25 MG capsule Take 1 capsule (25 mg total) by mouth at bedtime as needed. 30 capsule 3   linaclotide (LINZESS) 145 MCG CAPS capsule Take 1 capsule (145 mcg total) by mouth daily 30 minutes before a meal. 90 capsule 3   megestrol (MEGACE) 40 MG tablet Take 1 tablet (40 mg total) by mouth daily. 60 tablet 6   Multiple Vitamin (MULTIVITAMIN) tablet Take 1 tablet by mouth daily.     pantoprazole (PROTONIX) 40 MG tablet Take 1 tablet (40 mg total) by mouth daily. 90 tablet 1   DULoxetine (CYMBALTA) 60 MG capsule Take 1 capsule (60 mg total) by mouth daily. (Patient not taking: Reported on 01/03/2023) 90 capsule 1   HYDROcodone bit-homatropine (HYCODAN) 5-1.5 MG/5ML syrup Take 5 mLs by mouth every 8 (eight) hours as needed for cough. (Patient not taking: Reported on 01/03/2023) 120 mL 0   ondansetron (ZOFRAN) 4 MG tablet Take 1 tablet (4 mg total) by mouth every 6 (six) hours. (Patient not taking: Reported on 01/03/2023) 12 tablet 0   Probiotic Product (PROBIOTIC-10 PO) Take by mouth. (Patient not taking:  Reported on 01/03/2023)     promethazine (PHENERGAN) 25 MG tablet Take 1 tablet (25 mg total) by mouth every 6 (six) hours as needed for nausea or vomiting. (Patient not taking: Reported on 01/03/2023) 30 tablet 2   SODIUM FLUORIDE 5000 PPM 1.1 % PSTE Take by mouth. (Patient not taking: Reported on 01/03/2023)     SUMAtriptan (IMITREX) 50 MG tablet Take 1 tablet by mouth every 2 hours  as needed for migraine. May repeat in 2 hours if headache persists or recurs. (Patient not taking: Reported on 01/03/2023) 10 tablet 0   No facility-administered medications prior to visit.    No Known Allergies  Review of Systems  Constitutional:  Negative for fever.       (-)unexpected weight change (-)Adenopathy  HENT:  Positive for tinnitus. Negative for congestion, sinus pain and sore throat.   Eyes:        (-)Visual disturbance (+)worsening vision  Respiratory:  Negative for cough, shortness of breath and wheezing.   Cardiovascular:  Negative for chest pain, palpitations and leg swelling.  Gastrointestinal:  Negative for blood in stool, constipation, diarrhea, nausea and vomiting.  Genitourinary:  Negative for dysuria, frequency and hematuria.  Musculoskeletal:        (-)new muscle pain (-)new joint pain  Skin:        (-)new moles (+) non-itching rash on left lower leg  Neurological:  Negative for dizziness and headaches.  Psychiatric/Behavioral:  Negative for depression. The patient is not nervous/anxious.        Objective:    Physical Exam Constitutional:      General: She is not in acute distress.    Appearance: Normal appearance. She is not ill-appearing.  HENT:     Head: Normocephalic and atraumatic.     Right Ear: Tympanic membrane, ear canal and external ear normal.     Left Ear: Tympanic membrane, ear canal and external ear normal.  Eyes:     Extraocular Movements: Extraocular movements intact.     Right eye: No nystagmus.     Left eye: No nystagmus.     Pupils: Pupils are equal,  round, and reactive to light.  Neck:     Thyroid: No thyroid tenderness.  Cardiovascular:     Rate and Rhythm: Normal rate and regular rhythm.     Heart sounds: Normal heart sounds. No murmur heard.    No gallop.  Pulmonary:     Effort: Pulmonary effort is normal. No respiratory distress.     Breath sounds: Normal breath sounds. No wheezing or rales.  Abdominal:     General: There is no distension.     Palpations: Abdomen is soft.     Tenderness: There is no abdominal tenderness. There is no guarding.  Musculoskeletal:     Comments: 5/5 strength in both upper and lower extremities  Lymphadenopathy:     Cervical: No cervical adenopathy.  Skin:    General: Skin is warm and dry.  Neurological:     Mental Status: She is alert and oriented to person, place, and time.     Deep Tendon Reflexes:     Reflex Scores:      Patellar reflexes are 2+ on the right side and 2+ on the left side. Psychiatric:        Judgment: Judgment normal.     BP 133/81 (BP Location: Right Arm, Patient Position: Sitting, Cuff Size: Large)   Pulse (!) 104   Temp 98 F (36.7 C) (Oral)   Resp 16   Ht 5' 6"$  (1.676 m)   Wt 232 lb (105.2 kg)   LMP 01/03/2023   SpO2 98%   BMI 37.45 kg/m  Wt Readings from Last 3 Encounters:  01/18/23 232 lb (105.2 kg)  01/17/23 231 lb (104.8 kg)  01/03/23 228 lb (103.4 kg)       Assessment & Plan:  Preventative health care Assessment & Plan: Wt Readings from Last  3 Encounters:  01/18/23 232 lb (105.2 kg)  01/17/23 231 lb (104.8 kg)  01/03/23 228 lb (103.4 kg)   Working hard on diet.  Work on increasing exercise.   Declines flu shot and covid shot. She will schedule mammogram  Pap up to date.   Orders: -     Comprehensive metabolic panel  Tinnitus of both ears -     Ambulatory referral to Audiology  Family history of heart disease -     Ambulatory referral to Cardiology  Abnormal uterine bleeding (AUB) Assessment & Plan: She has not started megace as  prescribed by GYN. She is opting towards ablation instead.      I, Nance Pear, NP, personally preformed the services described in this documentation.  All medical record entries made by the scribe were at my direction and in my presence.  I have reviewed the chart and discharge instructions (if applicable) and agree that the record reflects my personal performance and is accurate and complete. 01/18/2023   I,Shehryar Baig,acting as a scribe for Nance Pear, NP.,have documented all relevant documentation on the behalf of Nance Pear, NP,as directed by  Nance Pear, NP while in the presence of Nance Pear, NP.   Nance Pear, NP

## 2023-01-19 LAB — COMPREHENSIVE METABOLIC PANEL
ALT: 11 U/L (ref 0–35)
AST: 14 U/L (ref 0–37)
Albumin: 3.9 g/dL (ref 3.5–5.2)
Alkaline Phosphatase: 69 U/L (ref 39–117)
BUN: 22 mg/dL (ref 6–23)
CO2: 25 mEq/L (ref 19–32)
Calcium: 9.5 mg/dL (ref 8.4–10.5)
Chloride: 104 mEq/L (ref 96–112)
Creatinine, Ser: 1.18 mg/dL (ref 0.40–1.20)
GFR: 55.39 mL/min — ABNORMAL LOW (ref >60.00)
Glucose, Bld: 79 mg/dL (ref 70–99)
Potassium: 4.7 mEq/L (ref 3.5–5.1)
Sodium: 136 mEq/L (ref 135–145)
Total Bilirubin: 0.5 mg/dL (ref 0.2–1.2)
Total Protein: 7.3 g/dL (ref 6.0–8.3)

## 2023-02-14 DIAGNOSIS — J020 Streptococcal pharyngitis: Secondary | ICD-10-CM | POA: Insufficient documentation

## 2023-02-14 DIAGNOSIS — F419 Anxiety disorder, unspecified: Secondary | ICD-10-CM | POA: Insufficient documentation

## 2023-02-17 ENCOUNTER — Other Ambulatory Visit: Payer: Self-pay | Admitting: Family

## 2023-02-17 ENCOUNTER — Other Ambulatory Visit (HOSPITAL_BASED_OUTPATIENT_CLINIC_OR_DEPARTMENT_OTHER): Payer: Self-pay

## 2023-02-18 ENCOUNTER — Other Ambulatory Visit (HOSPITAL_BASED_OUTPATIENT_CLINIC_OR_DEPARTMENT_OTHER): Payer: Self-pay

## 2023-02-18 MED ORDER — HYDROXYZINE PAMOATE 25 MG PO CAPS
25.0000 mg | ORAL_CAPSULE | Freq: Every evening | ORAL | 1 refills | Status: DC | PRN
  Filled 2023-02-18: qty 90, 90d supply, fill #0
  Filled 2023-04-22 – 2023-08-02 (×2): qty 90, 90d supply, fill #1

## 2023-02-22 ENCOUNTER — Ambulatory Visit: Payer: Managed Care, Other (non HMO) | Admitting: Family

## 2023-02-22 ENCOUNTER — Telehealth: Payer: Self-pay | Admitting: General Practice

## 2023-02-22 ENCOUNTER — Other Ambulatory Visit (HOSPITAL_BASED_OUTPATIENT_CLINIC_OR_DEPARTMENT_OTHER): Payer: Self-pay

## 2023-02-22 ENCOUNTER — Encounter: Payer: Self-pay | Admitting: Family

## 2023-02-22 VITALS — BP 116/68 | HR 76 | Resp 18 | Ht 66.0 in | Wt 231.6 lb

## 2023-02-22 DIAGNOSIS — J019 Acute sinusitis, unspecified: Secondary | ICD-10-CM | POA: Diagnosis not present

## 2023-02-22 MED ORDER — AMOXICILLIN-POT CLAVULANATE 875-125 MG PO TABS
1.0000 | ORAL_TABLET | Freq: Two times a day (BID) | ORAL | 0 refills | Status: AC
  Filled 2023-02-22: qty 20, 10d supply, fill #0

## 2023-02-22 MED ORDER — FLUTICASONE PROPIONATE 50 MCG/ACT NA SUSP
2.0000 | Freq: Every day | NASAL | 6 refills | Status: DC
  Filled 2023-02-22: qty 16, 30d supply, fill #0
  Filled 2023-03-29 – 2023-08-02 (×3): qty 16, 30d supply, fill #1
  Filled 2023-10-06: qty 16, 30d supply, fill #2
  Filled 2023-11-22 (×2): qty 16, 30d supply, fill #3
  Filled 2023-12-26 – 2024-01-26 (×2): qty 16, 30d supply, fill #4

## 2023-02-22 NOTE — Progress Notes (Signed)
Dana Carpenter is a 47 y.o. female with the following history as recorded in EpicCare:  Patient Active Problem List   Diagnosis Date Noted   Anxiety 02/14/2023   Strep pharyngitis 02/14/2023   Preventative health care 01/18/2023   Tinnitus of both ears 01/18/2023   Family history of heart disease 01/18/2023   GERD (gastroesophageal reflux disease) 02/08/2022   Insomnia 01/13/2022   Rhinorrhea 01/13/2022   Left leg swelling 01/13/2022   Chronic constipation 01/13/2022   Migraine without status migrainosus, not intractable 01/13/2022   Depression 07/08/2021   Obesity (BMI 30-39.9) 07/08/2021   Encounter for general adult medical examination with abnormal findings 02/13/2015   Malar rash 11/27/2014   Acne 11/27/2014   Grief at loss of child 03/27/2014   Abnormal uterine bleeding (AUB) 08/21/2013   History of pulmonary embolism 03/26/2013    Current Outpatient Medications  Medication Sig Dispense Refill   amoxicillin-clavulanate (AUGMENTIN) 875-125 MG tablet Take 1 tablet by mouth 2 (two) times daily for 10 days. 20 tablet 0   fluticasone (FLONASE) 50 MCG/ACT nasal spray Place 2 sprays into both nostrils daily. 16 g 6   hydrOXYzine (VISTARIL) 25 MG capsule Take 1 capsule (25 mg total) by mouth at bedtime as needed. 90 capsule 1   linaclotide (LINZESS) 145 MCG CAPS capsule Take 1 capsule (145 mcg total) by mouth daily 30 minutes before a meal. 90 capsule 3   Multiple Vitamin (MULTIVITAMIN) tablet Take 1 tablet by mouth daily.     pantoprazole (PROTONIX) 40 MG tablet Take 1 tablet (40 mg total) by mouth daily. 90 tablet 1   megestrol (MEGACE) 40 MG tablet Take 1 tablet (40 mg total) by mouth daily. (Patient not taking: Reported on 02/22/2023) 60 tablet 6   No current facility-administered medications for this visit.    Allergies: Patient has no known allergies.  Past Medical History:  Diagnosis Date   Abnormal uterine bleeding (AUB) 08/21/2013   Acne 11/27/2014   Anxiety     Chronic constipation 01/13/2022   Depression    Encounter for general adult medical examination with abnormal findings 02/13/2015   Family history of heart disease 01/18/2023   GERD (gastroesophageal reflux disease) 02/08/2022   Grief at loss of child 03/27/2014   History of pulmonary embolism 03/26/2013   Insomnia 01/13/2022   Left leg swelling 01/13/2022   Malar rash 11/27/2014   Migraine without status migrainosus, not intractable 01/13/2022   Obesity (BMI 30-39.9) 07/08/2021   Preventative health care 01/18/2023   Rhinorrhea 01/13/2022   Strep pharyngitis    Tinnitus of both ears 01/18/2023    Past Surgical History:  Procedure Laterality Date   DIAGNOSTIC LAPAROSCOPY WITH REMOVAL OF ECTOPIC PREGNANCY N/A 01/27/2017   Procedure: DIAGNOSTIC LAPAROSCOPY WITH RIGHT SALPINGECTOMY FOR RIGHT ECTOPIC PREGNANCY;  Surgeon: Chancy Milroy, MD;  Location: [facility];  Service: Gynecology;  Laterality: N/A;   WISDOM TOOTH EXTRACTION Bilateral     Family History  Problem Relation Age of Onset   Heart attack Mother 32       deceased- also had lung disorder (? pulmonary fibrosis)   Lung disease Mother    Hypertension Father    Healthy Sister        x 1   Diabetes Sister 44       type II   Hypertension Sister    Dementia Maternal Grandmother        82   Heart disease Maternal Grandfather    Cardiomyopathy Maternal Grandfather  Breast cancer Paternal Grandmother    Diabetes Paternal Grandfather    Prostate cancer Paternal Grandfather    Heart disease Maternal Aunt        heart disease   Cardiomyopathy Maternal Aunt        heart transplant   Cardiomyopathy Maternal Aunt    Cardiomyopathy Maternal Aunt    Heart disease Cousin        female cousin on mom's side "weak heart"   Cardiomyopathy Cousin        had heart transplant   Colon polyps Neg Hx    Colon cancer Neg Hx    Esophageal cancer Neg Hx    Stomach cancer Neg Hx    Rectal cancer Neg Hx     Social History   Tobacco Use    Smoking status: Never   Smokeless tobacco: Never   Tobacco comments:    never used tobacco  Substance Use Topics   Alcohol use: No    Alcohol/week: 0.0 standard drinks of alcohol    Comment: occasional    Subjective:  Patient presents with concerns for acute sinus infection;  Symptoms x 5-6 days; no OTC medications; does get recurrent sinus infections;   LMP 01/30/23  Objective:  Vitals:   02/22/23 1444  BP: 116/68  Pulse: 76  Resp: 18  SpO2: 100%  Weight: 231 lb 9.6 oz (105.1 kg)  Height: 5\' 6"  (1.676 m)    General: Well developed, well nourished, in no acute distress  Skin : Warm and dry.  Head: Normocephalic and atraumatic  Eyes: Sclera and conjunctiva clear; pupils round and reactive to light; extraocular movements intact  Ears: External normal; canals clear; tympanic membranes normal  Oropharynx: Pink, supple. No suspicious lesions  Neck: Supple without thyromegaly, adenopathy  Lungs: Respirations unlabored; clear to auscultation bilaterally without wheeze, rales, rhonchi  CVS exam: normal rate and regular rhythm.  Neurologic: Alert and oriented; speech intact; face symmetrical; moves all extremities well; CNII-XII intact without focal deficit   Assessment:  1. Acute sinusitis, recurrence not specified, unspecified location     Plan:  Rx for Augmentin and Flonase; increase fluids, rest and follow up worse, no better.   No follow-ups on file.  No orders of the defined types were placed in this encounter.   Requested Prescriptions   Signed Prescriptions Disp Refills   fluticasone (FLONASE) 50 MCG/ACT nasal spray 16 g 6    Sig: Place 2 sprays into both nostrils daily.   amoxicillin-clavulanate (AUGMENTIN) 875-125 MG tablet 20 tablet 0    Sig: Take 1 tablet by mouth 2 (two) times daily for 10 days.

## 2023-02-22 NOTE — Telephone Encounter (Signed)
-----   Message from Maurine Minister, [place] sent at 01/17/2023  9:49 AM EST ----- Regarding: Follow up 3 months, can be virtual with Dr. Currie Paris.

## 2023-02-24 ENCOUNTER — Telehealth: Payer: Self-pay | Admitting: Family

## 2023-02-24 ENCOUNTER — Ambulatory Visit: Payer: Managed Care, Other (non HMO) | Admitting: Cardiology

## 2023-02-24 NOTE — Telephone Encounter (Signed)
Pt saw Mickel Baas a couple days ago and was told to advise if her sxs did not improve so prednisone and a cough rx could be called in to help. Her sxs are not better so she would like those called in if possible. Routing to [facility] as Mickel Baas is not here.   [facility] [address], [place], [place] [address] Phone: [phone]  Fax: [phone]

## 2023-02-24 NOTE — Telephone Encounter (Signed)
Please advise 

## 2023-02-25 ENCOUNTER — Other Ambulatory Visit: Payer: Self-pay

## 2023-02-25 ENCOUNTER — Other Ambulatory Visit: Payer: Self-pay | Admitting: Family

## 2023-02-25 ENCOUNTER — Other Ambulatory Visit (HOSPITAL_BASED_OUTPATIENT_CLINIC_OR_DEPARTMENT_OTHER): Payer: Self-pay

## 2023-02-25 MED ORDER — PREDNISONE 20 MG PO TABS
40.0000 mg | ORAL_TABLET | Freq: Every day | ORAL | 0 refills | Status: DC
  Filled 2023-02-25: qty 10, 5d supply, fill #0

## 2023-02-25 MED ORDER — HYDROCODONE BIT-HOMATROP MBR 5-1.5 MG/5ML PO SOLN
5.0000 mL | Freq: Three times a day (TID) | ORAL | 0 refills | Status: DC | PRN
  Filled 2023-02-25: qty 120, 8d supply, fill #0

## 2023-02-25 NOTE — Telephone Encounter (Signed)
Spoke with pt, pt is aware meds have been sent in.

## 2023-03-29 ENCOUNTER — Other Ambulatory Visit (HOSPITAL_BASED_OUTPATIENT_CLINIC_OR_DEPARTMENT_OTHER): Payer: Self-pay

## 2023-04-07 ENCOUNTER — Other Ambulatory Visit (HOSPITAL_BASED_OUTPATIENT_CLINIC_OR_DEPARTMENT_OTHER): Payer: Self-pay

## 2023-04-12 ENCOUNTER — Ambulatory Visit: Payer: Managed Care, Other (non HMO) | Attending: Cardiology | Admitting: Cardiology

## 2023-04-18 ENCOUNTER — Telehealth: Payer: Managed Care, Other (non HMO) | Admitting: Obstetrics and Gynecology

## 2023-04-22 ENCOUNTER — Other Ambulatory Visit: Payer: Self-pay | Admitting: Family

## 2023-04-22 ENCOUNTER — Other Ambulatory Visit (HOSPITAL_BASED_OUTPATIENT_CLINIC_OR_DEPARTMENT_OTHER): Payer: Self-pay

## 2023-04-23 MED ORDER — PANTOPRAZOLE SODIUM 40 MG PO TBEC
40.0000 mg | DELAYED_RELEASE_TABLET | Freq: Every day | ORAL | 1 refills | Status: DC
  Filled 2023-04-23 – 2023-08-02 (×2): qty 90, 90d supply, fill #0
  Filled 2023-10-25: qty 90, 90d supply, fill #1

## 2023-04-25 ENCOUNTER — Other Ambulatory Visit (HOSPITAL_BASED_OUTPATIENT_CLINIC_OR_DEPARTMENT_OTHER): Payer: Self-pay

## 2023-04-25 ENCOUNTER — Other Ambulatory Visit: Payer: Self-pay

## 2023-05-09 ENCOUNTER — Other Ambulatory Visit (HOSPITAL_BASED_OUTPATIENT_CLINIC_OR_DEPARTMENT_OTHER): Payer: Self-pay

## 2023-08-02 ENCOUNTER — Other Ambulatory Visit (HOSPITAL_BASED_OUTPATIENT_CLINIC_OR_DEPARTMENT_OTHER): Payer: Self-pay

## 2023-08-09 ENCOUNTER — Other Ambulatory Visit (HOSPITAL_BASED_OUTPATIENT_CLINIC_OR_DEPARTMENT_OTHER): Payer: Self-pay

## 2023-08-22 ENCOUNTER — Telehealth: Payer: Self-pay

## 2023-08-22 ENCOUNTER — Emergency Department (HOSPITAL_BASED_OUTPATIENT_CLINIC_OR_DEPARTMENT_OTHER)
Admission: EM | Admit: 2023-08-22 | Discharge: 2023-08-22 | Discharge status: Against Medical Advice | Payer: Managed Care, Other (non HMO) | Attending: Emergency Medicine | Admitting: Emergency Medicine

## 2023-08-22 ENCOUNTER — Encounter (HOSPITAL_BASED_OUTPATIENT_CLINIC_OR_DEPARTMENT_OTHER): Payer: Self-pay | Admitting: Urology

## 2023-08-22 ENCOUNTER — Emergency Department (HOSPITAL_BASED_OUTPATIENT_CLINIC_OR_DEPARTMENT_OTHER): Payer: Managed Care, Other (non HMO)

## 2023-08-22 ENCOUNTER — Other Ambulatory Visit: Payer: Self-pay

## 2023-08-22 DIAGNOSIS — M7989 Other specified soft tissue disorders: Secondary | ICD-10-CM | POA: Insufficient documentation

## 2023-08-22 DIAGNOSIS — Z5321 Procedure and treatment not carried out due to patient leaving prior to being seen by health care provider: Secondary | ICD-10-CM | POA: Insufficient documentation

## 2023-08-22 NOTE — Telephone Encounter (Signed)
Initial Comment Caller states she would like to get an appt. Her left leg is swollen and she has history of blood clots. Translation No Nurse Assessment Nurse: Andrey Campanile, RN, Marylene Land Date/Time (Eastern Time): 08/22/2023 10:31:53 AM Confirm and document reason for call. If symptomatic, describe symptoms. ---Caller states her left leg is swollen and she has history of blood clots(in lungs in 2014). Bruise around ankle(did not injure), this started 2 days ago. Does the patient have any new or worsening symptoms? ---Yes Will a triage be completed? ---Yes Related visit to physician within the last 2 weeks? ---No Does the PT have any chronic conditions? (i.e. diabetes, asthma, this includes High risk factors for pregnancy, etc.) ---Yes List chronic conditions. ---Insomnia, GERD, Hx blood clot in lung Is the patient pregnant or possibly pregnant? (Ask all females between the ages of 63-55) ---No Is this a behavioral health or substance abuse call? ---No Guidelines Guideline Title Affirmed Question Affirmed Notes Nurse Date/Time Lamount Cohen Time) Leg Swelling and Edema Difficulty breathing at rest Andrey Campanile, RN, Marylene Land 08/22/2023 10:34:25 AM Disp. Time Lamount Cohen Time) Disposition Final User 08/22/2023 10:36:27 AM Go to ED Now Yes Andrey Campanile, RN, Marylene Land PLEASE NOTE: All timestamps contained within this report are represented as [place] Standard Time. CONFIDENTIALTY NOTICE: This fax transmission is intended only for the addressee. It contains information that is legally privileged, confidential or otherwise protected from use or disclosure. If you are not the intended recipient, you are strictly prohibited from reviewing, disclosing, copying using or disseminating any of this information or taking any action in reliance on or regarding this information. If you have received this fax in error, please notify us immediately by telephone so that we can arrange for its return to [place]. Phone: [phone],  Toll-Free: [phone], Fax: [phone] Page: 2 of 2 Call Id: [id number] Final Disposition 08/22/2023 10:36:27 AM Go to ED Now Yes Andrey Campanile, RN, Rosalyn Charters Disagree/Comply Comply Caller Understands Yes PreDisposition Did not know what to do Care Advice Given Per Guideline GO TO ED NOW: * You need to be seen in the Emergency Department. NOTE TO TRIAGER - DRIVING: * Another adult should drive. BRING MEDICINES: * Bring a list of your current medicines when you go to the Emergency Department (ER). * Bring the pill bottles too. This will help the doctor (or NP/PA) to make certain you are taking the right medicines and the right dose. CALL 911 IF: * You become worse CARE ADVICE given per Leg Swelling and Edema (Adult) guideline. Referrals [facility] - ED

## 2023-08-22 NOTE — ED Triage Notes (Signed)
Pt states left leg swelling and pain x 3 days  Recent long car ride  H/o dvt

## 2023-08-22 NOTE — ED Provider Notes (Signed)
Patient eloped and left with out seeing a provider.    Achille Rich, PA-C 08/22/23 1653    Elayne Snare K, DO 08/23/23 [phone]

## 2023-08-22 NOTE — Telephone Encounter (Signed)
Pt in ED.

## 2023-08-22 NOTE — ED Notes (Signed)
Pt cannot be found

## 2023-08-25 ENCOUNTER — Ambulatory Visit: Payer: Managed Care, Other (non HMO)

## 2023-08-25 ENCOUNTER — Other Ambulatory Visit (HOSPITAL_COMMUNITY)
Admission: RE | Admit: 2023-08-25 | Discharge: 2023-08-25 | Disposition: A | Discharge status: Home / Self Care | Payer: Managed Care, Other (non HMO) | Source: Ambulatory Visit | Attending: Family Medicine | Admitting: Family Medicine

## 2023-08-25 VITALS — BP 125/73 | HR 77 | Ht 66.0 in | Wt 231.0 lb

## 2023-08-25 DIAGNOSIS — N898 Other specified noninflammatory disorders of vagina: Secondary | ICD-10-CM | POA: Insufficient documentation

## 2023-08-25 DIAGNOSIS — Z113 Encounter for screening for infections with a predominantly sexual mode of transmission: Secondary | ICD-10-CM | POA: Diagnosis present

## 2023-08-25 NOTE — Progress Notes (Signed)
SUBJECTIVE:  47 y.o. female complains of vaginal irritation for 3 days.  Denies abnormal vaginal bleeding or significant pelvic pain or fever. No UTI symptoms. Denies history of known exposure to STD.  Patient's last menstrual period was 08/07/2023.  OBJECTIVE:  She appears well, afebrile. Urine dipstick: not done.  ASSESSMENT:  Vaginal irritation Screening for STI's   PLAN:  GC, chlamydia, trichomonas, BVAG, CVAG probe sent to lab. Treatment: To be determined once lab results are received ROV prn if symptoms persist or worsen.

## 2023-08-26 ENCOUNTER — Ambulatory Visit: Payer: Managed Care, Other (non HMO) | Admitting: Family

## 2023-08-26 ENCOUNTER — Other Ambulatory Visit (HOSPITAL_BASED_OUTPATIENT_CLINIC_OR_DEPARTMENT_OTHER): Payer: Self-pay

## 2023-08-26 VITALS — BP 136/80 | HR 71 | Temp 98.0°F | Resp 16 | Ht 66.0 in | Wt 230.8 lb

## 2023-08-26 DIAGNOSIS — M7989 Other specified soft tissue disorders: Secondary | ICD-10-CM | POA: Diagnosis not present

## 2023-08-26 DIAGNOSIS — K5909 Other constipation: Secondary | ICD-10-CM

## 2023-08-26 DIAGNOSIS — M5416 Radiculopathy, lumbar region: Secondary | ICD-10-CM | POA: Insufficient documentation

## 2023-08-26 DIAGNOSIS — R21 Rash and other nonspecific skin eruption: Secondary | ICD-10-CM | POA: Diagnosis not present

## 2023-08-26 DIAGNOSIS — E669 Obesity, unspecified: Secondary | ICD-10-CM

## 2023-08-26 LAB — CERVICOVAGINAL ANCILLARY ONLY
Bacterial Vaginitis (gardnerella): NEGATIVE
Candida Glabrata: NEGATIVE
Candida Vaginitis: POSITIVE — AB
Chlamydia: NEGATIVE
Comment: NEGATIVE
Comment: NEGATIVE
Comment: NEGATIVE
Comment: NEGATIVE
Comment: NEGATIVE
Comment: NORMAL
Neisseria Gonorrhea: NEGATIVE
Trichomonas: NEGATIVE

## 2023-08-26 MED ORDER — MELOXICAM 7.5 MG PO TABS
7.5000 mg | ORAL_TABLET | Freq: Every day | ORAL | 0 refills | Status: DC
  Filled 2023-08-26: qty 14, 14d supply, fill #0

## 2023-08-26 MED ORDER — METHOCARBAMOL 500 MG PO TABS
500.0000 mg | ORAL_TABLET | Freq: Three times a day (TID) | ORAL | 0 refills | Status: DC | PRN
  Filled 2023-08-26: qty 20, 7d supply, fill #0

## 2023-08-26 MED ORDER — BETAMETHASONE DIPROPIONATE 0.05 % EX CREA
TOPICAL_CREAM | Freq: Two times a day (BID) | CUTANEOUS | 0 refills | Status: DC
  Filled 2023-08-26: qty 30, 30d supply, fill #0

## 2023-08-26 MED ORDER — ZEPBOUND 2.5 MG/0.5ML ~~LOC~~ SOAJ
2.5000 mg | SUBCUTANEOUS | 0 refills | Status: DC
  Filled 2023-08-26: qty 2, 28d supply, fill #0
  Filled 2023-10-06: qty 2, 28d supply, fill #1

## 2023-08-26 NOTE — Progress Notes (Signed)
Subjective:     Patient ID: Dana Carpenter, female    DOB: 1976-05-29, 47 y.o.   MRN: [id number]  Chief Complaint  Patient presents with   Follow-up    Follow up    HPI  Discussed the use of AI scribe software for clinical note transcription with the patient, who gave verbal consent to proceed.  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 She reports associated left leg swelling which prompted her to go to the ED.  LE doppler performed in the ED on 2/16 was negative for clot. She notes that the left leg swelling has since resolved but intermittently recurs. The patient describes the pain as constant aching and sometimes tingling. Discomfort is worse at night, likening it to restless leg syndrome. The patient denies calf pain and is unaware of any varicose veins.  In addition to the leg and back issues, the patient reports a rash on her back that has been itchy but is now resolving.   She also expresses a desire to lose weight, having made dietary changes and started walking 3-4 miles in the evenings. However, she struggles with late-night snacking and feels that weight loss is not as easy as it was ten years ago.       Wt Readings from Last 3 Encounters:  08/26/23 230 lb 12.8 oz (104.7 kg)  08/25/23 231 lb (104.8 kg)  08/22/23 231 lb 11.3 oz (105.1 kg)      Health Maintenance Due  Topic Date Due   Hepatitis C Screening  Never done   INFLUENZA VACCINE  07/07/2023   COVID-19 Vaccine (3 - 2023-24 season) 08/07/2023    Past Medical History:  Diagnosis Date   Abnormal uterine bleeding (AUB) 08/21/2013   Acne 11/27/2014   Anxiety    Chronic constipation 01/13/2022   Depression    Encounter for general adult medical examination with abnormal findings 02/13/2015   Family history of heart disease 01/18/2023   GERD (gastroesophageal  reflux disease) 02/08/2022   Grief at loss of child 03/27/2014   History of pulmonary embolism 03/26/2013   Insomnia 01/13/2022   Left leg swelling 01/13/2022   Malar rash 11/27/2014   Migraine without status migrainosus, not intractable 01/13/2022   Obesity (BMI 30-39.9) 07/08/2021   Preventative health care 01/18/2023   Rhinorrhea 01/13/2022   Strep pharyngitis    Tinnitus of both ears 01/18/2023    Past Surgical History:  Procedure Laterality Date   DIAGNOSTIC LAPAROSCOPY WITH REMOVAL OF ECTOPIC PREGNANCY N/A 01/27/2017   Procedure: DIAGNOSTIC LAPAROSCOPY WITH RIGHT SALPINGECTOMY FOR RIGHT ECTOPIC PREGNANCY;  Surgeon: Hermina Staggers, MD;  Location: [facility];  Service: Gynecology;  Laterality: N/A;   WISDOM TOOTH EXTRACTION Bilateral     Family History  Problem Relation Age of Onset   Heart attack Mother 38       deceased- also had lung disorder (? pulmonary fibrosis)   Lung disease Mother    Hypertension Father    Healthy Sister        x 1   Diabetes Sister 53       type II   Hypertension Sister    Dementia Maternal Grandmother        81   Heart disease Maternal Grandfather    Cardiomyopathy Maternal Grandfather    Breast cancer Paternal Grandmother  Diabetes Paternal Grandfather    Prostate cancer Paternal Grandfather    Heart disease Maternal Aunt        heart disease   Cardiomyopathy Maternal Aunt        heart transplant   Cardiomyopathy Maternal Aunt    Cardiomyopathy Maternal Aunt    Heart disease Cousin        female cousin on mom's side "weak heart"   Cardiomyopathy Cousin        had heart transplant   Colon polyps Neg Hx    Colon cancer Neg Hx    Esophageal cancer Neg Hx    Stomach cancer Neg Hx    Rectal cancer Neg Hx     Social History   Socioeconomic History   Marital status: Single    Spouse name: Not on file   Number of children: Not on file   Years of education: Not on file   Highest education level: Not on file  Occupational History    Not on file  Tobacco Use   Smoking status: Never   Smokeless tobacco: Never   Tobacco comments:    never used tobacco  Vaping Use   Vaping status: Never Used  Substance and Sexual Activity   Alcohol use: No    Alcohol/week: 0.0 standard drinks of alcohol    Comment: occasional   Drug use: No   Sexual activity: Not on file  Other Topics Concern   Not on file  Social History Narrative   Lives with 2 sons Dana Carpenter 2008, Dana Carpenter 2011   Older son born 93- in and out of the home   Boyfriend, but single   Works in [employer]   Enjoys sleeping.   Completed some college   Sister lives locally, most of her family in [place] and has 2 aunts in [place]   Social Determinants of Health   Financial Resource Strain: Not on file  Food Insecurity: Not on file  Transportation Needs: Not on file  Physical Activity: Not on file  Stress: Not on file  Social Connections: Unknown (09/08/2022)   [facility]   Social Network    Social Network: Not on file  Intimate Partner Violence: Unknown (09/08/2022)   Received from [facility], [facility]   HITS    Physically Hurt: Not on file    Insult or Talk Down To: Not on file    Threaten Physical Harm: Not on file    Scream or Curse: Not on file    Outpatient Medications Prior to Visit  Medication Sig Dispense Refill   fluticasone (FLONASE) 50 MCG/ACT nasal spray Place 2 sprays into both nostrils daily. 16 g 6   hydrOXYzine (VISTARIL) 25 MG capsule Take 1 capsule (25 mg total) by mouth at bedtime as needed. 90 capsule 1   linaclotide (LINZESS) 145 MCG CAPS capsule Take 1 capsule (145 mcg total) by mouth daily 30 minutes before a meal. 90 capsule 3   megestrol (MEGACE) 40 MG tablet Take 1 tablet (40 mg total) by mouth daily. 60 tablet 6   Multiple Vitamin (MULTIVITAMIN) tablet Take 1 tablet by mouth daily.     pantoprazole (PROTONIX) 40 MG tablet Take 1 tablet (40 mg total) by mouth daily.  90 tablet 1   HYDROcodone bit-homatropine (HYCODAN) 5-1.5 MG/5ML syrup Take 5 mLs by mouth every 8 (eight) hours as needed for cough. 120 mL 0   predniSONE (DELTASONE) 20 MG tablet Take 2 tablets (40 mg total)  by mouth daily with breakfast. 10 tablet 0   No facility-administered medications prior to visit.    No Known Allergies  ROS    See HPI Objective:    Physical Exam Constitutional:      General: She is not in acute distress.    Appearance: Normal appearance. She is well-developed.  HENT:     Head: Normocephalic and atraumatic.     Right Ear: External ear normal.     Left Ear: External ear normal.  Eyes:     General: No scleral icterus. Neck:     Thyroid: No thyromegaly.  Cardiovascular:     Rate and Rhythm: Normal rate and regular rhythm.     Heart sounds: Normal heart sounds. No murmur heard. Pulmonary:     Effort: Pulmonary effort is normal. No respiratory distress.     Breath sounds: Normal breath sounds. No wheezing.  Musculoskeletal:     Cervical back: Neck supple.  Skin:    General: Skin is warm and dry.     Comments: Mild dry eczematous rash noted left lower back  Neurological:     Mental Status: She is alert and oriented to person, place, and time.     Deep Tendon Reflexes:     Reflex Scores:      Patellar reflexes are 2+ on the right side and 2+ on the left side.    Comments: Bilateral LE strength is 5/5  Psychiatric:        Mood and Affect: Mood normal.        Behavior: Behavior normal.        Thought Content: Thought content normal.        Judgment: Judgment normal.      BP 136/80 (BP Location: Right Arm, Patient Position: Sitting, Cuff Size: Normal)   Pulse 71   Temp 98 F (36.7 C) (Oral)   Resp 16   Ht 5\' 6"  (1.676 m)   Wt 230 lb 12.8 oz (104.7 kg)   LMP 08/07/2023   SpO2 99%   BMI 37.25 kg/m  Wt Readings from Last 3 Encounters:  08/26/23 230 lb 12.8 oz (104.7 kg)  08/25/23 231 lb (104.8 kg)  08/22/23 231 lb 11.3 oz (105.1 kg)        Assessment & Plan:   Problem List Items Addressed This Visit       Unprioritized   Skin rash - Primary   Relevant Medications   betamethasone dipropionate 0.05 % cream   Obesity (BMI 30-39.9)    Trial of Zepbound. Discussed common side effect.  Denies personal or family hx of thyroid cancer.       Relevant Medications   tirzepatide (ZEPBOUND) 2.5 MG/0.5ML Pen   Morbid obesity (HCC)   Relevant Medications   tirzepatide (ZEPBOUND) 2.5 MG/0.5ML Pen   Lumbar radiculopathy     Likely sciatica. Pain and tingling in the left leg, worse at night. History of bulging disc in 2014. -Prescribe anti-inflammatory medication for two weeks.  -Advise patient to use heating pad as needed for comfort.  -Prescribe muscle relaxer for use as needed, with caution regarding drowsiness.        Relevant Medications   tirzepatide (ZEPBOUND) 2.5 MG/0.5ML Pen   methocarbamol (ROBAXIN) 500 MG tablet   Left leg swelling    Resolved. LE doppler negative.       Chronic constipation    Improved with linzess. Continue same.        I have discontinued Kia N. Levay's HYDROcodone  bit-homatropine and predniSONE. I am also having her start on meloxicam, betamethasone dipropionate, Zepbound, and methocarbamol. Additionally, I am having her maintain her multivitamin, Linzess, megestrol, hydrOXYzine, fluticasone, and pantoprazole.  Meds ordered this encounter  Medications   meloxicam (MOBIC) 7.5 MG tablet    Sig: Take 1 tablet (7.5 mg total) by mouth daily.    Dispense:  14 tablet    Refill:  0    Order Specific Question:   Supervising Provider    Answer:   Danise Edge A [[id number]]   betamethasone dipropionate 0.05 % cream    Sig: Apply topically 2 (two) times daily.    Dispense:  30 g    Refill:  0    Order Specific Question:   Supervising Provider    Answer:   Danise Edge A [[id number]]   tirzepatide (ZEPBOUND) 2.5 MG/0.5ML Pen    Sig: Inject 2.5 mg into the skin once a week.    Dispense:  4 mL     Refill:  0    Order Specific Question:   Supervising Provider    Answer:   Danise Edge A [[id number]]   methocarbamol (ROBAXIN) 500 MG tablet    Sig: Take 1 tablet (500 mg total) by mouth every 8 (eight) hours as needed.    Dispense:  20 tablet    Refill:  0    Order Specific Question:   Supervising Provider    Answer:   Danise Edge A [[id number]]

## 2023-08-26 NOTE — Assessment & Plan Note (Signed)
  Likely sciatica. Pain and tingling in the left leg, worse at night. History of bulging disc in 2014. -Prescribe anti-inflammatory medication for two weeks.  -Advise patient to use heating pad as needed for comfort.  -Prescribe muscle relaxer for use as needed, with caution regarding drowsiness.

## 2023-08-26 NOTE — Assessment & Plan Note (Signed)
Trial of Zepbound. Discussed common side effect.  Denies personal or family hx of thyroid cancer.

## 2023-08-26 NOTE — Patient Instructions (Signed)
VISIT SUMMARY:  During your visit, we discussed several health concerns including your left leg swelling, lower back pain that radiates to your left leg, an itchy rash on your back, and your desire to lose weight. We also reviewed your general health maintenance.  YOUR PLAN:  -LEFT LEG SWELLING: Your leg swelling has resolved and there was no evidence of a blood clot in your leg on the ultrasound.  -LOWER BACK PAIN WITH RADIATION TO LEFT LEG: Your lower back pain and tingling in your left leg is likely due to sciatica, a condition where pain radiates along the path of the sciatic nerve. I have prescribed an anti-inflammatory medication and a muscle relaxer to help manage your pain. You can also use a heating pad for comfort.  -ITCHY RASH ON BACK: Your itchy rash on your back is resolving. I have prescribed a steroid cream that you can use as needed.  -WEIGHT MANAGEMENT: I understand that you are struggling with weight loss despite making dietary changes and increasing your physical activity. I recommend eating three meals a day, each including a fresh fruit or vegetable, and starting with a daily caloric intake of 1500 calories. I have sent a prescription for Zepbound, a weight loss injection, to your pharmacy for approval. We will follow up in one month to assess your progress and potentially adjust the dose of Zepbound.  -GENERAL HEALTH MAINTENANCE: You declined the flu shot at this time.  INSTRUCTIONS:  Please take your prescribed medications as directed. Remember to use the muscle relaxer with caution as it can cause drowsiness. Apply the steroid cream to your rash as needed. Follow the dietary recommendations and start the Zepbound injections once they are approved by your pharmacy. We will schedule a follow-up appointment in one month to assess your progress.

## 2023-08-26 NOTE — Assessment & Plan Note (Signed)
Improved with linzess. Continue same.

## 2023-08-26 NOTE — Assessment & Plan Note (Signed)
Resolved. LE doppler negative.

## 2023-08-29 ENCOUNTER — Other Ambulatory Visit: Payer: Self-pay | Admitting: Obstetrics and Gynecology

## 2023-08-29 ENCOUNTER — Telehealth: Payer: Self-pay

## 2023-08-29 ENCOUNTER — Other Ambulatory Visit (HOSPITAL_COMMUNITY): Payer: Self-pay

## 2023-08-29 MED ORDER — FLUCONAZOLE 150 MG PO TABS: 150.0000 mg | ORAL_TABLET | Freq: Once | ORAL | 0 refills | Status: AC

## 2023-08-29 NOTE — Addendum Note (Signed)
Addended by: Levie Heritage on: 08/29/2023 12:44 PM   Modules accepted: Orders

## 2023-08-29 NOTE — Telephone Encounter (Signed)
Pharmacy Patient Advocate Encounter   Received notification from Physician's Office that prior authorization for Zepbound is required/requested.   Insurance verification completed.   The patient is insured through [employer] .   Per test claim: PA required; PA submitted to [employer] via CoverMyMeds Key/confirmation #/EOC Key: [id number]  Status is pending

## 2023-08-29 NOTE — Progress Notes (Unsigned)
Per patient's swab Pt has yeast infection.   Attempted to contact pt regarding what pharmacy to send in the Rx to.

## 2023-08-31 ENCOUNTER — Other Ambulatory Visit (HOSPITAL_COMMUNITY): Payer: Self-pay

## 2023-08-31 NOTE — Telephone Encounter (Signed)
Left detailed message for patient to be aware rx was approved

## 2023-08-31 NOTE — Telephone Encounter (Signed)
Pharmacy Patient Advocate Encounter  Received notification from [employer] that Prior Authorization for Zepbound has been APPROVED from 8.24.24 to 5.21.25. Ran test claim, Copay is $24.99. This test claim was processed through [facility]- copay amounts may vary at other pharmacies due to pharmacy/plan contracts, or as the patient moves through the different stages of their insurance plan.   PA #/Case ID/Reference #: Key: [id number]

## 2023-09-05 ENCOUNTER — Other Ambulatory Visit (HOSPITAL_BASED_OUTPATIENT_CLINIC_OR_DEPARTMENT_OTHER): Payer: Self-pay

## 2023-10-06 ENCOUNTER — Other Ambulatory Visit (HOSPITAL_BASED_OUTPATIENT_CLINIC_OR_DEPARTMENT_OTHER): Payer: Self-pay

## 2023-10-06 ENCOUNTER — Other Ambulatory Visit: Payer: Self-pay | Admitting: Family

## 2023-10-06 MED ORDER — ZEPBOUND 5 MG/0.5ML ~~LOC~~ SOAJ
5.0000 mg | SUBCUTANEOUS | 0 refills | Status: DC
  Filled 2023-10-06: qty 2, 28d supply, fill #0

## 2023-10-14 ENCOUNTER — Encounter: Payer: Self-pay | Admitting: Obstetrics and Gynecology

## 2023-10-14 ENCOUNTER — Ambulatory Visit: Payer: Managed Care, Other (non HMO) | Admitting: Obstetrics and Gynecology

## 2023-10-14 ENCOUNTER — Other Ambulatory Visit (HOSPITAL_COMMUNITY)
Admission: RE | Admit: 2023-10-14 | Discharge: 2023-10-14 | Disposition: A | Discharge status: Home / Self Care | Payer: Managed Care, Other (non HMO) | Source: Ambulatory Visit | Attending: Obstetrics and Gynecology | Admitting: Obstetrics and Gynecology

## 2023-10-14 VITALS — BP 120/79 | HR 78 | Wt 218.0 lb

## 2023-10-14 DIAGNOSIS — N898 Other specified noninflammatory disorders of vagina: Secondary | ICD-10-CM | POA: Insufficient documentation

## 2023-10-14 DIAGNOSIS — Z3009 Encounter for other general counseling and advice on contraception: Secondary | ICD-10-CM

## 2023-10-14 NOTE — Progress Notes (Signed)
   RETURN GYNECOLOGY VISIT  Subjective:  Dana Carpenter is a 47 y.o. [phone] with LMP 10/01/23 presenting for multiple concerns  Birth control counseling - got out of long term relationship and would like to start birth control. Has history of PE in 2014 when she was on OCPs. Hypercoag w/u was unremarkable.  Has small piece of tissue/lump on the inside of her vagina that she can feel Has odor at end of her period, would like to be tested for BV Vaginal dryness  I personally reviewed the following: Last pap 2023 NILM/HPV neg Notes from Dr. Myna Hidalgo (heme/onc) 07/02/13, 10/01/13, 12/31/13  Objective:   Vitals:   10/14/23 1033  BP: 120/79  Pulse: 78  Weight: 218 lb (98.9 kg)   General:  Alert, oriented and cooperative. Patient is in no acute distress.  Skin: Skin is warm and dry. No rash noted.   Cardiovascular: Normal heart rate noted  Respiratory: Normal respiratory effort, no problems with respiration noted  Abdomen: Soft, non-tender, non-distended   Pelvic: NEFG. Small 5mm fleshy nodule on posterior vaginal wall around 2cm proximal to the introitus and along what appears to be scar from obstetric laceration.   Exam performed in the presence of a chaperone  Assessment and Plan:  Dana Carpenter is a 47 y.o. with history of PE on OCPs presenting for   1. General counseling and advice on contraceptive management Patient is not on [facility] and had a PE on COCs. Hypercoag w/u negative Discussed safest option is nonhormonal birth control including Cu IUD and barrier methods. Also has option for permanent sterilization Other acceptable options include Nexplanon and POPs She is considering her options  2. Vaginal discharge - Cervicovaginal ancillary only( [place])  3. Vaginal dryness Discussed use of lubricants, emollients, and coconut oil Option for vaginal estrogen if symptoms persist  4. Vaginal wall cyst Appears to be inclusion cyst vs scar tissue from prior  laceration Reassurance provided, discussed she should RTC if she becomes symptomatic or notices increased size  Return if symptoms worsen or fail to improve.  Lennart Pall, MD

## 2023-10-14 NOTE — Patient Instructions (Signed)
[email or web address] - has information about all kinds of non-hormonal birth control  It was nice meeting you today! Your pap is up to date. You will get your BV test results next week

## 2023-10-17 LAB — CERVICOVAGINAL ANCILLARY ONLY
Bacterial Vaginitis (gardnerella): POSITIVE — AB
Comment: NEGATIVE

## 2023-10-17 MED ORDER — METRONIDAZOLE 0.75 % VA GEL: 1.0000 | Freq: Every day | VAGINAL | 1 refills | Status: DC

## 2023-10-17 NOTE — Addendum Note (Signed)
Addended by: Harvie Bridge on: 10/17/2023 05:01 PM   Modules accepted: Orders

## 2023-10-18 ENCOUNTER — Other Ambulatory Visit: Payer: Self-pay

## 2023-10-18 DIAGNOSIS — N760 Acute vaginitis: Secondary | ICD-10-CM

## 2023-10-18 MED ORDER — METRONIDAZOLE 0.75 % VA GEL: 1.0000 | Freq: Every day | VAGINAL | 1 refills | Status: DC

## 2023-10-25 ENCOUNTER — Other Ambulatory Visit: Payer: Self-pay | Admitting: Family

## 2023-10-25 ENCOUNTER — Other Ambulatory Visit (HOSPITAL_BASED_OUTPATIENT_CLINIC_OR_DEPARTMENT_OTHER): Payer: Self-pay

## 2023-10-25 MED ORDER — ZEPBOUND 7.5 MG/0.5ML ~~LOC~~ SOAJ
7.5000 mg | SUBCUTANEOUS | 0 refills | Status: DC
  Filled 2023-10-25: qty 2, 28d supply, fill #0

## 2023-10-26 ENCOUNTER — Other Ambulatory Visit (HOSPITAL_BASED_OUTPATIENT_CLINIC_OR_DEPARTMENT_OTHER): Payer: Self-pay

## 2023-10-26 ENCOUNTER — Encounter (HOSPITAL_BASED_OUTPATIENT_CLINIC_OR_DEPARTMENT_OTHER): Payer: Self-pay

## 2023-11-22 ENCOUNTER — Ambulatory Visit: Payer: Managed Care, Other (non HMO) | Admitting: Family

## 2023-11-22 ENCOUNTER — Other Ambulatory Visit: Payer: Self-pay | Admitting: Family

## 2023-11-22 ENCOUNTER — Other Ambulatory Visit (HOSPITAL_BASED_OUTPATIENT_CLINIC_OR_DEPARTMENT_OTHER): Payer: Self-pay

## 2023-11-22 VITALS — BP 115/69 | HR 93 | Temp 98.6°F | Resp 16 | Ht 66.0 in | Wt 209.0 lb

## 2023-11-22 DIAGNOSIS — H6692 Otitis media, unspecified, left ear: Secondary | ICD-10-CM | POA: Diagnosis not present

## 2023-11-22 MED ORDER — PANTOPRAZOLE SODIUM 40 MG PO TBEC
40.0000 mg | DELAYED_RELEASE_TABLET | Freq: Every day | ORAL | 1 refills | Status: DC
  Filled 2023-11-22 – 2024-01-26 (×2): qty 90, 90d supply, fill #0
  Filled 2024-03-05 – 2024-04-30 (×2): qty 90, 90d supply, fill #1

## 2023-11-22 MED ORDER — ZEPBOUND 7.5 MG/0.5ML ~~LOC~~ SOAJ
7.5000 mg | SUBCUTANEOUS | 0 refills | Status: DC
  Filled 2023-11-22 – 2023-11-23 (×2): qty 2, 28d supply, fill #0

## 2023-11-22 MED ORDER — HYDROXYZINE PAMOATE 25 MG PO CAPS
25.0000 mg | ORAL_CAPSULE | Freq: Every evening | ORAL | 1 refills | Status: DC | PRN
  Filled 2023-11-22: qty 90, 90d supply, fill #0
  Filled 2024-01-26 – 2024-01-30 (×2): qty 90, 90d supply, fill #1

## 2023-11-22 MED ORDER — AMOXICILLIN 500 MG PO CAPS
500.0000 mg | ORAL_CAPSULE | Freq: Two times a day (BID) | ORAL | 0 refills | Status: AC
  Filled 2023-11-22: qty 20, 10d supply, fill #0

## 2023-11-22 NOTE — Progress Notes (Unsigned)
Subjective:     Patient ID: Dana Carpenter, female    DOB: 1976-08-03, 47 y.o.   MRN: [id number]  Chief Complaint  Patient presents with   Sore Throat    Patient here for sore throat for 4 days   Ear Pain    Complains of bilateral ear ache for 4 days   Generalized Body Aches    Complains of body aches and fatigue    HPI  Discussed the use of AI scribe software for clinical note transcription with the patient, who gave verbal consent to proceed.  History of Present Illness   The patient presents with a sore throat and ear pain that began upon waking. They describe the throat pain as severe and have been producing green phlegm. They also report feeling tired and achy, but deny fever, cough, and rhinorrhea. The ear pain is a new symptom. They have tried Tylenol and Motrin with minimal relief. 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  In addition to the acute symptoms, the patient has been successfully losing weight with the help of a medication and regular exercise. They report a change in their insurance coverage that may require them to join a weight loss group in order to continue receiving the medication.       Wt Readings from Last 3 Encounters:  11/22/23 209 lb (94.8 kg)  10/14/23 218 lb (98.9 kg)  08/26/23 230 lb 12.8 oz (104.7 kg)      Health Maintenance Due  Topic Date Due   Hepatitis C Screening  Never done   COVID-19 Vaccine (3 - 2024-25 season) 08/07/2023    Past Medical History:  Diagnosis Date   Abnormal uterine bleeding (AUB) 08/21/2013   Acne 11/27/2014   Anxiety    Chronic constipation 01/13/2022   Depression    Family history of heart disease 01/18/2023   GERD (gastroesophageal reflux disease) 02/08/2022   Grief at loss of child 03/27/2014   History of pulmonary embolism 03/26/2013   Insomnia 01/13/2022   Left leg swelling 01/13/2022   Malar rash  11/27/2014   Migraine without status migrainosus, not intractable 01/13/2022   Obesity (BMI 30-39.9) 07/08/2021   Preventative health care 01/18/2023   Rhinorrhea 01/13/2022   Strep pharyngitis    Tinnitus of both ears 01/18/2023    Past Surgical History:  Procedure Laterality Date   DIAGNOSTIC LAPAROSCOPY WITH REMOVAL OF ECTOPIC PREGNANCY N/A 01/27/2017   Procedure: DIAGNOSTIC LAPAROSCOPY WITH RIGHT SALPINGECTOMY FOR RIGHT ECTOPIC PREGNANCY;  Surgeon: Hermina Staggers, MD;  Location: [facility];  Service: Gynecology;  Laterality: N/A;   WISDOM TOOTH EXTRACTION Bilateral     Family History  Problem Relation Age of Onset   Heart attack Mother 39       deceased- also had lung disorder (? pulmonary fibrosis)   Lung disease Mother    Hypertension Father    Healthy Sister        x 1   Diabetes Sister 56       type II   Hypertension Sister    Dementia Maternal Grandmother        27   Heart disease Maternal Grandfather    Cardiomyopathy Maternal Grandfather    Breast cancer Paternal Grandmother    Diabetes Paternal Grandfather    Prostate cancer Paternal Grandfather    Heart disease Maternal Aunt        heart disease  Cardiomyopathy Maternal Aunt        heart transplant   Cardiomyopathy Maternal Aunt    Cardiomyopathy Maternal Aunt    Heart disease Cousin        female cousin on mom's side "weak heart"   Cardiomyopathy Cousin        had heart transplant   Colon polyps Neg Hx    Colon cancer Neg Hx    Esophageal cancer Neg Hx    Stomach cancer Neg Hx    Rectal cancer Neg Hx     Social History   Socioeconomic History   Marital status: Single    Spouse name: Not on file   Number of children: Not on file   Years of education: Not on file   Highest education level: Not on file  Occupational History   Not on file  Tobacco Use   Smoking status: Never   Smokeless tobacco: Never   Tobacco comments:    never used tobacco  Vaping Use   Vaping status: Never Used  Substance  and Sexual Activity   Alcohol use: No    Alcohol/week: 0.0 standard drinks of alcohol    Comment: occasional   Drug use: No   Sexual activity: Not on file  Other Topics Concern   Not on file  Social History Narrative   Lives with 2 sons [name] 2008, [name] 2011   Older son born 73- in and out of the home   Boyfriend, but single   Works in [employer]   Enjoys sleeping.   Completed some college   Sister lives locally, most of her family in [place] and has 2 aunts in [place]   Social Drivers of Health   Financial Resource Strain: Not on file  Food Insecurity: Not on file  Transportation Needs: Not on file  Physical Activity: Not on file  Stress: Not on file  Social Connections: Unknown (09/08/2022)   Received from [facility], [facility]   Social Network    Social Network: Not on file  Intimate Partner Violence: Unknown (09/08/2022)   Received from [employer], [facility]   HITS    Physically Hurt: Not on file    Insult or Talk Down To: Not on file    Threaten Physical Harm: Not on file    Scream or Curse: Not on file    Outpatient Medications Prior to Visit  Medication Sig Dispense Refill   betamethasone dipropionate 0.05 % cream Apply topically 2 (two) times daily. 30 g 0   fluticasone (FLONASE) 50 MCG/ACT nasal spray Place 2 sprays into both nostrils daily. 16 g 6   hydrOXYzine (VISTARIL) 25 MG capsule Take 1 capsule (25 mg total) by mouth at bedtime as needed. 90 capsule 1   linaclotide (LINZESS) 145 MCG CAPS capsule Take 1 capsule (145 mcg total) by mouth daily 30 minutes before a meal. 90 capsule 3   megestrol (MEGACE) 40 MG tablet Take 1 tablet (40 mg total) by mouth daily. 60 tablet 6   meloxicam (MOBIC) 7.5 MG tablet Take 1 tablet (7.5 mg total) by mouth daily. 14 tablet 0   methocarbamol (ROBAXIN) 500 MG tablet Take 1 tablet (500 mg total) by mouth every 8 (eight) hours as needed. 20 tablet 0   metroNIDAZOLE (METROGEL) 0.75 %  vaginal gel Place 1 Applicatorful vaginally at bedtime. Apply one applicatorful to vagina at bedtime for 5 days 70 g 1   Multiple Vitamin (MULTIVITAMIN) tablet Take 1 tablet by mouth daily.  pantoprazole (PROTONIX) 40 MG tablet Take 1 tablet (40 mg total) by mouth daily. 90 tablet 1   tirzepatide (ZEPBOUND) 7.5 MG/0.5ML Pen Inject 7.5 mg into the skin once a week. 2 mL 0   No facility-administered medications prior to visit.    No Known Allergies  ROS See HPI    Objective:    Physical Exam Constitutional:      General: She is not in acute distress.    Appearance: Normal appearance. She is well-developed.  HENT:     Head: Normocephalic and atraumatic.     Right Ear: Tympanic membrane, ear canal and external ear normal.     Left Ear: Ear canal and external ear normal. Tympanic membrane is erythematous.     Mouth/Throat:     Pharynx: Posterior oropharyngeal erythema present. No oropharyngeal exudate.  Eyes:     General: No scleral icterus. Neck:     Thyroid: No thyromegaly.  Cardiovascular:     Rate and Rhythm: Normal rate and regular rhythm.     Heart sounds: Normal heart sounds. No murmur heard. Pulmonary:     Effort: Pulmonary effort is normal. No respiratory distress.     Breath sounds: Normal breath sounds. No wheezing.  Musculoskeletal:     Cervical back: Neck supple.  Skin:    General: Skin is warm and dry.  Neurological:     Mental Status: She is alert and oriented to person, place, and time.  Psychiatric:        Mood and Affect: Mood normal.        Behavior: Behavior normal.        Thought Content: Thought content normal.        Judgment: Judgment normal.      BP 115/69 (BP Location: Right Arm, Patient Position: Sitting, Cuff Size: Small)   Pulse 93   Temp 98.6 F (37 C) (Oral)   Resp 16   Ht 5\' 6"  (1.676 m)   Wt 209 lb (94.8 kg)   SpO2 100%   BMI 33.73 kg/m  Wt Readings from Last 3 Encounters:  11/22/23 209 lb (94.8 kg)  10/14/23 218 lb (98.9  kg)  08/26/23 230 lb 12.8 oz (104.7 kg)       Assessment & Plan:   Problem List Items Addressed This Visit       Unprioritized   Left otitis media - Primary   Pharyngitis and Otitis Media Sore throat with purulent sputum and ear pain. No fever, cough, or rhinorrhea. Throat appears red. Discussed the possibility of strep throat and the need for antibiotics. -Prescribe Amoxicillin 500mg  twice daily for 10 days. -Advise use of Chloraseptic spray or Cepacol lozenges for symptomatic relief. -Advise to discard toothbrush in 24 hours to prevent reinfection.  Yeast Infection Prophylaxis Prescribing Amoxicillin, which can predispose to yeast infections. -Prescribe Diflucan to have on hand in case of symptoms of yeast infection.      Relevant Medications   amoxicillin (AMOXIL) 500 MG capsule    I am having Suhayla N. Currey start on amoxicillin. I am also having her maintain her multivitamin, Linzess, megestrol, fluticasone, meloxicam, betamethasone dipropionate, methocarbamol, metroNIDAZOLE, hydrOXYzine, and pantoprazole.  Meds ordered this encounter  Medications   amoxicillin (AMOXIL) 500 MG capsule    Sig: Take 1 capsule (500 mg total) by mouth 2 (two) times daily for 10 days.    Dispense:  20 capsule    Refill:  0    Supervising Provider:   Danise Edge A [[id number]]

## 2023-11-23 ENCOUNTER — Telehealth: Payer: Self-pay

## 2023-11-23 ENCOUNTER — Other Ambulatory Visit (HOSPITAL_BASED_OUTPATIENT_CLINIC_OR_DEPARTMENT_OTHER): Payer: Self-pay

## 2023-11-23 ENCOUNTER — Telehealth: Payer: Self-pay | Admitting: Family

## 2023-11-23 MED ORDER — ZEPBOUND 10 MG/0.5ML ~~LOC~~ SOAJ: 10.0000 mg | SUBCUTANEOUS | 0 refills | Status: DC

## 2023-11-23 NOTE — Telephone Encounter (Signed)
PA initiated via Covermymeds; KEY: [id number]  Per plan- clinical override not needed

## 2023-11-23 NOTE — Telephone Encounter (Signed)
See my chart message

## 2023-11-24 DIAGNOSIS — H6692 Otitis media, unspecified, left ear: Secondary | ICD-10-CM | POA: Insufficient documentation

## 2023-11-24 NOTE — Assessment & Plan Note (Signed)
Pharyngitis and Otitis Media Sore throat with purulent sputum and ear pain. No fever, cough, or rhinorrhea. Throat appears red. Discussed the possibility of strep throat and the need for antibiotics. -Prescribe Amoxicillin 500mg  twice daily for 10 days. -Advise use of Chloraseptic spray or Cepacol lozenges for symptomatic relief. -Advise to discard toothbrush in 24 hours to prevent reinfection.  Yeast Infection Prophylaxis Prescribing Amoxicillin, which can predispose to yeast infections. -Prescribe Diflucan to have on hand in case of symptoms of yeast infection.

## 2023-11-25 ENCOUNTER — Other Ambulatory Visit: Payer: Self-pay | Admitting: Family

## 2023-11-28 ENCOUNTER — Other Ambulatory Visit (HOSPITAL_BASED_OUTPATIENT_CLINIC_OR_DEPARTMENT_OTHER): Payer: Self-pay

## 2023-11-28 MED ORDER — ZEPBOUND 10 MG/0.5ML ~~LOC~~ SOAJ
10.0000 mg | SUBCUTANEOUS | 0 refills | Status: DC
  Filled 2023-11-28: qty 2, 28d supply, fill #0

## 2023-12-26 ENCOUNTER — Other Ambulatory Visit: Payer: Self-pay | Admitting: Family

## 2023-12-26 ENCOUNTER — Other Ambulatory Visit (HOSPITAL_BASED_OUTPATIENT_CLINIC_OR_DEPARTMENT_OTHER): Payer: Self-pay

## 2023-12-26 MED ORDER — ZEPBOUND 10 MG/0.5ML ~~LOC~~ SOAJ
10.0000 mg | SUBCUTANEOUS | 0 refills | Status: DC
  Filled 2023-12-26 – 2024-01-11 (×4): qty 2, 28d supply, fill #0

## 2023-12-27 ENCOUNTER — Other Ambulatory Visit (HOSPITAL_BASED_OUTPATIENT_CLINIC_OR_DEPARTMENT_OTHER): Payer: Self-pay

## 2023-12-29 ENCOUNTER — Other Ambulatory Visit (HOSPITAL_BASED_OUTPATIENT_CLINIC_OR_DEPARTMENT_OTHER): Payer: Self-pay

## 2024-01-02 ENCOUNTER — Other Ambulatory Visit (HOSPITAL_BASED_OUTPATIENT_CLINIC_OR_DEPARTMENT_OTHER): Payer: Self-pay

## 2024-01-02 ENCOUNTER — Telehealth: Payer: Self-pay | Admitting: Family

## 2024-01-02 ENCOUNTER — Encounter (HOSPITAL_BASED_OUTPATIENT_CLINIC_OR_DEPARTMENT_OTHER): Payer: Self-pay

## 2024-01-02 NOTE — Telephone Encounter (Signed)
PA initiated via Covermymeds; KEY: [id number].    PA denied. Plan exclusion.

## 2024-01-03 NOTE — Telephone Encounter (Signed)
Windell Moulding, can you please forward to PA team to restart PA, see letter attached from insurance for additional information.

## 2024-01-09 ENCOUNTER — Telehealth: Payer: Self-pay

## 2024-01-09 ENCOUNTER — Other Ambulatory Visit (HOSPITAL_COMMUNITY): Payer: Self-pay

## 2024-01-09 ENCOUNTER — Other Ambulatory Visit (HOSPITAL_BASED_OUTPATIENT_CLINIC_OR_DEPARTMENT_OTHER): Payer: Self-pay

## 2024-01-09 NOTE — Telephone Encounter (Signed)
PA request has been Submitted. New Encounter created for follow up. For additional info see Pharmacy Prior Auth telephone encounter from 01/09/2024.

## 2024-01-09 NOTE — Telephone Encounter (Signed)
Pharmacy Patient Advocate Encounter   Received notification from Pt Calls Messages that prior authorization for Zepbound 10MG /0.5ML pen-injectors is required/requested.   Insurance verification completed.   The patient is insured through [employer] .   Per test claim: PA required; PA submitted to above mentioned insurance via CoverMyMeds Key/confirmation #/EOC [id number] Status is pending

## 2024-01-11 ENCOUNTER — Other Ambulatory Visit (HOSPITAL_BASED_OUTPATIENT_CLINIC_OR_DEPARTMENT_OTHER): Payer: Self-pay

## 2024-01-11 NOTE — Telephone Encounter (Signed)
 PA approved.   CaseId:[id number];Status:Approved;Review Type:Prior Auth;Coverage Start Date:12/10/2023;Coverage End Date:09/05/2024; Authorization Expiration Date: 09/04/2024

## 2024-01-26 ENCOUNTER — Other Ambulatory Visit (HOSPITAL_BASED_OUTPATIENT_CLINIC_OR_DEPARTMENT_OTHER): Payer: Self-pay

## 2024-01-26 ENCOUNTER — Other Ambulatory Visit: Payer: Self-pay | Admitting: Family

## 2024-01-26 ENCOUNTER — Other Ambulatory Visit: Payer: Self-pay

## 2024-01-26 MED ORDER — ZEPBOUND 10 MG/0.5ML ~~LOC~~ SOAJ
10.0000 mg | SUBCUTANEOUS | 0 refills | Status: DC
  Filled 2024-01-26 – 2024-02-03 (×3): qty 2, 28d supply, fill #0

## 2024-01-27 ENCOUNTER — Other Ambulatory Visit (HOSPITAL_BASED_OUTPATIENT_CLINIC_OR_DEPARTMENT_OTHER): Payer: Self-pay

## 2024-01-30 ENCOUNTER — Ambulatory Visit: Payer: Managed Care, Other (non HMO)

## 2024-01-30 ENCOUNTER — Ambulatory Visit: Payer: Managed Care, Other (non HMO) | Admitting: Family Medicine

## 2024-01-30 VITALS — BP 139/83 | HR 85 | Ht 66.0 in | Wt 199.0 lb

## 2024-01-30 DIAGNOSIS — Z3202 Encounter for pregnancy test, result negative: Secondary | ICD-10-CM

## 2024-01-30 DIAGNOSIS — N912 Amenorrhea, unspecified: Secondary | ICD-10-CM

## 2024-01-30 DIAGNOSIS — N926 Irregular menstruation, unspecified: Secondary | ICD-10-CM

## 2024-01-30 LAB — POCT URINE PREGNANCY: Preg Test, Ur: NEGATIVE

## 2024-01-30 NOTE — Progress Notes (Signed)
 Dana Carpenter here for a UPT. Pt had a negative upt at home. LMP is 12/24/2023.     UPT in office Negative.    Dana Carpenter [employer]

## 2024-01-31 ENCOUNTER — Other Ambulatory Visit (HOSPITAL_BASED_OUTPATIENT_CLINIC_OR_DEPARTMENT_OTHER): Payer: Self-pay

## 2024-02-03 ENCOUNTER — Other Ambulatory Visit: Payer: Self-pay

## 2024-02-03 ENCOUNTER — Other Ambulatory Visit (HOSPITAL_BASED_OUTPATIENT_CLINIC_OR_DEPARTMENT_OTHER): Payer: Self-pay

## 2024-02-07 ENCOUNTER — Other Ambulatory Visit (HOSPITAL_BASED_OUTPATIENT_CLINIC_OR_DEPARTMENT_OTHER): Payer: Self-pay

## 2024-03-05 ENCOUNTER — Other Ambulatory Visit: Payer: Self-pay | Admitting: Family

## 2024-03-05 ENCOUNTER — Other Ambulatory Visit (HOSPITAL_BASED_OUTPATIENT_CLINIC_OR_DEPARTMENT_OTHER): Payer: Self-pay

## 2024-03-05 MED ORDER — HYDROXYZINE PAMOATE 25 MG PO CAPS
25.0000 mg | ORAL_CAPSULE | Freq: Every evening | ORAL | 1 refills | Status: DC | PRN
  Filled 2024-03-05: qty 90, 90d supply, fill #0

## 2024-03-05 MED ORDER — ZEPBOUND 10 MG/0.5ML ~~LOC~~ SOAJ
10.0000 mg | SUBCUTANEOUS | 0 refills | Status: DC
  Filled 2024-03-05: qty 2, 28d supply, fill #0

## 2024-03-06 ENCOUNTER — Other Ambulatory Visit (HOSPITAL_BASED_OUTPATIENT_CLINIC_OR_DEPARTMENT_OTHER): Payer: Self-pay

## 2024-03-19 ENCOUNTER — Other Ambulatory Visit: Payer: Self-pay

## 2024-03-19 ENCOUNTER — Other Ambulatory Visit (HOSPITAL_BASED_OUTPATIENT_CLINIC_OR_DEPARTMENT_OTHER): Payer: Self-pay

## 2024-03-19 MED ORDER — FLUTICASONE PROPIONATE 50 MCG/ACT NA SUSP
2.0000 | Freq: Every day | NASAL | 6 refills | Status: AC
  Filled 2024-03-19: qty 16, 30d supply, fill #0
  Filled 2024-04-30: qty 16, 30d supply, fill #1
  Filled 2024-05-31: qty 16, 30d supply, fill #2
  Filled 2024-07-03: qty 16, 30d supply, fill #3
  Filled 2024-08-17: qty 16, 30d supply, fill #4
  Filled 2024-12-26: qty 16, 30d supply, fill #5

## 2024-03-28 ENCOUNTER — Other Ambulatory Visit (HOSPITAL_BASED_OUTPATIENT_CLINIC_OR_DEPARTMENT_OTHER): Payer: Self-pay

## 2024-03-28 ENCOUNTER — Ambulatory Visit: Admitting: Family

## 2024-03-28 ENCOUNTER — Encounter: Payer: Self-pay | Admitting: Family

## 2024-03-28 ENCOUNTER — Ambulatory Visit (HOSPITAL_BASED_OUTPATIENT_CLINIC_OR_DEPARTMENT_OTHER)
Admission: RE | Admit: 2024-03-28 | Discharge: 2024-03-28 | Disposition: A | Discharge status: Home / Self Care | Source: Ambulatory Visit | Attending: Family | Admitting: Family

## 2024-03-28 VITALS — BP 109/63 | HR 80 | Temp 98.9°F | Resp 16 | Ht 66.0 in | Wt 185.0 lb

## 2024-03-28 DIAGNOSIS — E663 Overweight: Secondary | ICD-10-CM | POA: Insufficient documentation

## 2024-03-28 DIAGNOSIS — K581 Irritable bowel syndrome with constipation: Secondary | ICD-10-CM | POA: Insufficient documentation

## 2024-03-28 DIAGNOSIS — G4700 Insomnia, unspecified: Secondary | ICD-10-CM | POA: Diagnosis not present

## 2024-03-28 DIAGNOSIS — E0789 Other specified disorders of thyroid: Secondary | ICD-10-CM | POA: Insufficient documentation

## 2024-03-28 DIAGNOSIS — K219 Gastro-esophageal reflux disease without esophagitis: Secondary | ICD-10-CM

## 2024-03-28 DIAGNOSIS — F32A Depression, unspecified: Secondary | ICD-10-CM | POA: Diagnosis not present

## 2024-03-28 DIAGNOSIS — R6882 Decreased libido: Secondary | ICD-10-CM | POA: Insufficient documentation

## 2024-03-28 DIAGNOSIS — E042 Nontoxic multinodular goiter: Secondary | ICD-10-CM | POA: Diagnosis not present

## 2024-03-28 DIAGNOSIS — E041 Nontoxic single thyroid nodule: Secondary | ICD-10-CM | POA: Insufficient documentation

## 2024-03-28 MED ORDER — TRAZODONE HCL 50 MG PO TABS
25.0000 mg | ORAL_TABLET | Freq: Every evening | ORAL | 3 refills | Status: DC | PRN
  Filled 2024-03-28: qty 30, 30d supply, fill #0
  Filled 2024-04-30: qty 30, 30d supply, fill #1
  Filled 2024-06-21 – 2024-07-03 (×2): qty 30, 30d supply, fill #2
  Filled 2024-09-26 – 2024-11-05 (×2): qty 30, 30d supply, fill #3

## 2024-03-28 MED ORDER — LINACLOTIDE 145 MCG PO CAPS
145.0000 ug | ORAL_CAPSULE | Freq: Every day | ORAL | 1 refills | Status: DC
  Filled 2024-03-28: qty 90, 90d supply, fill #0
  Filled 2024-06-21 – 2024-07-03 (×2): qty 90, 90d supply, fill #1

## 2024-03-28 NOTE — Progress Notes (Addendum)
 Subjective:     Patient ID: Dana Carpenter, female    DOB: 1976-03-11, 48 y.o.   MRN: [id number]  Chief Complaint  Patient presents with   Alopecia    Patient complains of recent hair loss   Low libido    Patient complains of low libido    Obesity    Here for follow up    HPI  Discussed the use of AI scribe software for clinical note transcription with the patient, who gave verbal consent to proceed.  History of Present Illness Dana Carpenter is a 48 year old female who presents with side effects from Zepbound .  She has been experiencing hair loss, decreased libido, and changes in nail growth since starting Zepbound  in September of the previous year. Her fingernails, usually long and hard, have not grown, and she has noticed a decrease in sex drive. She is taking various vitamins and is considering dietary changes to address these issues.  She has experienced significant weight loss since starting Zepbound , which she attributes to a lack of sugar cravings and a decreased desire to eat meat. She consumes Premier protein shakes and is trying to maintain a high-protein diet with fruits and vegetables. Her goal weight is 170 pounds, and she is close to achieving it.  She has a history of constipation, which has worsened since she ran out of linzess .  She  is currently using Dulcolax. She is trying to increase her intake of spinach, salads, and kale to manage her symptoms.  She experiences occasional reflux symptoms, which she manages with daily pantoprazole . She had a recent episode of a large burp but denies regular reflux issues.  She has a history of heavy menstrual bleeding, which has slowed down significantly. She was previously prescribed medication for heavy periods but did not take it due to concerns about blood clots.  She reports difficulty sleeping and feeling tired all the time. She currently uses hydroxyzine  and Benadryl  to aid sleep but finds them less effective. She has  a history of using trazodone  in the past without recalling any side effects.  She mentions a recent dental visit where the dentist noted something unusual, prompting a recommendation for further evaluation. She also reports feeling a fullness on the right side of her thyroid .  She is experiencing changes in her sexual function, including difficulty achieving orgasm, which she attributes to her recent life changes, including ending a ten-year relationship and starting a new one.  Wt Readings from Last 3 Encounters:  03/28/24 185 lb (83.9 kg)  01/30/24 199 lb (90.3 kg)  11/22/23 209 lb (94.8 kg)      Health Maintenance Due  Topic Date Due   Hepatitis C Screening  Never done    Past Medical History:  Diagnosis Date   Abnormal uterine bleeding (AUB) 08/21/2013   Acne 11/27/2014   Anxiety    Chronic constipation 01/13/2022   Depression    Family history of heart disease 01/18/2023   GERD (gastroesophageal reflux disease) 02/08/2022   Grief at loss of child 03/27/2014   History of pulmonary embolism 03/26/2013   Insomnia 01/13/2022   Left leg swelling 01/13/2022   Malar rash 11/27/2014   Migraine without status migrainosus, not intractable 01/13/2022   Obesity (BMI 30-39.9) 07/08/2021   Preventative health care 01/18/2023   Rhinorrhea 01/13/2022   Strep pharyngitis    Tinnitus of both ears 01/18/2023    Past Surgical History:  Procedure Laterality Date   DIAGNOSTIC LAPAROSCOPY WITH REMOVAL  OF ECTOPIC PREGNANCY N/A 01/27/2017   Procedure: DIAGNOSTIC LAPAROSCOPY WITH RIGHT SALPINGECTOMY FOR RIGHT ECTOPIC PREGNANCY;  Surgeon: Othelia Blinks, MD;  Location: [facility];  Service: Gynecology;  Laterality: N/A;   WISDOM TOOTH EXTRACTION Bilateral     Family History  Problem Relation Age of Onset   Heart attack Mother 53       deceased- also had lung disorder (? pulmonary fibrosis)   Lung disease Mother    Hypertension Father    Healthy Sister        x 1   Diabetes Sister 43        type II   Hypertension Sister    Dementia Maternal Grandmother        34   Heart disease Maternal Grandfather    Cardiomyopathy Maternal Grandfather    Breast cancer Paternal Grandmother    Diabetes Paternal Grandfather    Prostate cancer Paternal Grandfather    Heart disease Maternal Aunt        heart disease   Cardiomyopathy Maternal Aunt        heart transplant   Cardiomyopathy Maternal Aunt    Cardiomyopathy Maternal Aunt    Heart disease Cousin        female cousin on mom's side "weak heart"   Cardiomyopathy Cousin        had heart transplant   Colon polyps Neg Hx    Colon cancer Neg Hx    Esophageal cancer Neg Hx    Stomach cancer Neg Hx    Rectal cancer Neg Hx     Social History   Socioeconomic History   Marital status: Single    Spouse name: Not on file   Number of children: Not on file   Years of education: Not on file   Highest education level: Not on file  Occupational History   Not on file  Tobacco Use   Smoking status: Never   Smokeless tobacco: Never   Tobacco comments:    never used tobacco  Vaping Use   Vaping status: Never Used  Substance and Sexual Activity   Alcohol use: No    Alcohol/week: 0.0 standard drinks of alcohol    Comment: occasional   Drug use: No   Sexual activity: Not on file  Other Topics Concern   Not on file  Social History Narrative   Lives with 2 sons Ransom Byers 2008, [name] 2011   One of her son's died in a MVA   Older son born 42- in and out of the home   Boyfriend, but single   Works in [employer]   Enjoys sleeping.   Completed some college   Sister lives locally, most of her family in [place] and has 2 aunts in [place]   Social Drivers of Health   Financial Resource Strain: Not on file  Food Insecurity: Not on file  Transportation Needs: Not on file  Physical Activity: Not on file  Stress: Not on file  Social Connections: Unknown (09/08/2022)   [facility] from [facility]   Social Network    Social Network: Not on file  Intimate Partner Violence: Unknown (09/08/2022)   Received from [employer], [facility]   HITS    Physically Hurt: Not on file    Insult or Talk Down To: Not on file    Threaten Physical Harm: Not on file    Scream or Curse: Not on file    Outpatient Medications Prior to Visit  Medication Sig Dispense Refill   betamethasone  dipropionate 0.05 % cream Apply topically 2 (two) times daily. 30 g 0   fluticasone  (FLONASE ) 50 MCG/ACT nasal spray Place 2 sprays into both nostrils daily. 16 g 6   meloxicam  (MOBIC ) 7.5 MG tablet Take 1 tablet (7.5 mg total) by mouth daily. 14 tablet 0   Multiple Vitamin (MULTIVITAMIN) tablet Take 1 tablet by mouth daily.     pantoprazole  (PROTONIX ) 40 MG tablet Take 1 tablet (40 mg total) by mouth daily. 90 tablet 1   tirzepatide  (ZEPBOUND ) 10 MG/0.5ML Pen Inject 10 mg into the skin once a week. 2 mL 0   hydrOXYzine  (VISTARIL ) 25 MG capsule Take 1 capsule (25 mg total) by mouth at bedtime as needed. 90 capsule 1   linaclotide  (LINZESS ) 145 MCG CAPS capsule Take 1 capsule (145 mcg total) by mouth daily 30 minutes before a meal. (Patient not taking: Reported on 01/30/2024) 90 capsule 3   megestrol  (MEGACE ) 40 MG tablet Take 1 tablet (40 mg total) by mouth daily. (Patient not taking: Reported on 01/30/2024) 60 tablet 6   methocarbamol  (ROBAXIN ) 500 MG tablet Take 1 tablet (500 mg total) by mouth every 8 (eight) hours as needed. (Patient not taking: Reported on 01/30/2024) 20 tablet 0   metroNIDAZOLE  (METROGEL ) 0.75 % vaginal gel Place 1 Applicatorful vaginally at bedtime. Apply one applicatorful to vagina at bedtime for 5 days (Patient not taking: Reported on 01/30/2024) 70 g 1   No facility-administered medications prior to visit.    No Known Allergies  ROS    See HPI Objective:    Physical Exam Constitutional:      General: She is not in acute distress.    Appearance: Normal appearance. She is  well-developed.  HENT:     Head: Normocephalic and atraumatic.     Right Ear: External ear normal.     Left Ear: External ear normal.  Eyes:     General: No scleral icterus. Neck:     Thyroid : No thyromegaly.     Comments: Right thyroid  fullness Cardiovascular:     Rate and Rhythm: Normal rate and regular rhythm.     Heart sounds: Normal heart sounds. No murmur heard. Pulmonary:     Effort: Pulmonary effort is normal. No respiratory distress.     Breath sounds: Normal breath sounds. No wheezing.  Musculoskeletal:     Cervical back: Neck supple.  Skin:    General: Skin is warm and dry.  Neurological:     Mental Status: She is alert and oriented to person, place, and time.  Psychiatric:        Mood and Affect: Mood normal.        Behavior: Behavior normal.        Thought Content: Thought content normal.        Judgment: Judgment normal.      BP 109/63 (BP Location: Right Arm, Patient Position: Sitting)   Pulse 80   Temp 98.9 F (37.2 C) (Oral)   Resp 16   Ht 5\' 6"  (1.676 m)   Wt 185 lb (83.9 kg)   SpO2 100%   BMI 29.86 kg/m  Wt Readings from Last 3 Encounters:  03/28/24 185 lb (83.9 kg)  01/30/24 199 lb (90.3 kg)  11/22/23 209 lb (94.8 kg)       Assessment & Plan:   Problem List Items Addressed This Visit       Unprioritized   Thyroid  fullness   Obtain thyroid  US .  Relevant Orders   US  THYROID    Overweight (BMI 25.0-29.9)   Weight loss with hair loss and decreased nail growth likely due to calorie deficit. Nearing goal weight with reduced sugar cravings and altered taste preferences. Side effects may improve as weight stabilizes. - Encourage high protein diet. - Recommend biotin supplementation. - She does not wish to discontinue zepbound  at this time.      Low libido   Irritable bowel syndrome with constipation   Restart linzess       Relevant Medications   linaclotide  (LINZESS ) 145 MCG CAPS capsule   Insomnia   D/c hydroxyzine , d/c  benadryl .  Trial of trazodone . Has not tolerated ambien  in the past due to sleep walking.      Relevant Medications   traZODone  (DESYREL ) 50 MG tablet   GERD (gastroesophageal reflux disease)   Maintained on pantoprazole  once daily. Stable.       Relevant Medications   linaclotide  (LINZESS ) 145 MCG CAPS capsule   Depression - Primary   Mood is stable without medications.       Relevant Medications   traZODone  (DESYREL ) 50 MG tablet    I have discontinued Nakiesha N. Danker's megestrol , methocarbamol , metroNIDAZOLE , and hydrOXYzine . I am also having her start on traZODone . Additionally, I am having her maintain her multivitamin, meloxicam , betamethasone  dipropionate, pantoprazole , Zepbound , fluticasone , and linaclotide .  Meds ordered this encounter  Medications   linaclotide  (LINZESS ) 145 MCG CAPS capsule    Sig: Take 1 capsule (145 mcg total) by mouth daily 30 minutes before a meal.    Dispense:  90 capsule    Refill:  1    Supervising Provider:   Randie Bustle A [[id number]]   traZODone  (DESYREL ) 50 MG tablet    Sig: Take 0.5-1 tablets (25-50 mg total) by mouth at bedtime as needed for sleep.    Dispense:  30 tablet    Refill:  3    Supervising Provider:   Randie Bustle A [[id number]]

## 2024-03-28 NOTE — Assessment & Plan Note (Signed)
 Restart linzess

## 2024-03-28 NOTE — Assessment & Plan Note (Signed)
 Mood is stable without medications.

## 2024-03-28 NOTE — Patient Instructions (Signed)
 VISIT SUMMARY:  During your visit, we discussed several health concerns, including side effects from Zepbound , weight loss, constipation, thyroid  issues, insomnia, and decreased libido. We reviewed your symptoms and made adjustments to your treatment plan to help manage these issues.  YOUR PLAN:  -WEIGHT LOSS: Your weight loss, along with hair loss and decreased nail growth, is likely due to a calorie deficit. As you near your goal weight, these side effects may improve. Continue with a high-protein diet and consider taking biotin supplements to support hair and nail health.  -CONSTIPATION: We will switch back to Linzess  to help manage your symptoms. Make sure to drink plenty of water and eat fresh fruits and vegetables to aid digestion.  -THYROID  NODULE: You have reported a feeling of fullness on the right side of your thyroid , which could be due to thyroid  nodules or cysts. We will schedule a thyroid  ultrasound to investigate further.  -INSOMNIA: Your chronic insomnia has not improved with hydroxyzine  and Benadryl . We will switch to trazodone  to help improve your sleep quality. Start with 25 mg and increase to 50 mg if needed.  -DECREASED LIBIDO: Your decreased libido and difficulty achieving orgasm may be related to Zepbound  or hormonal changes. If these symptoms persist, we may refer you to a gynecologist. Open communication with your new partner is also encouraged.  INSTRUCTIONS:  We will send prescriptions for Linzess  and trazodone  to your pharmacy. Please schedule an appointment for a thyroid  ultrasound. If your symptoms persist or worsen, follow up with us  or consider seeing a specialist as discussed.

## 2024-03-28 NOTE — Assessment & Plan Note (Signed)
 Weight loss with hair loss and decreased nail growth likely due to calorie deficit. Nearing goal weight with reduced sugar cravings and altered taste preferences. Side effects may improve as weight stabilizes. - Encourage high protein diet. - Recommend biotin supplementation. - She does not wish to discontinue zepbound  at this time.

## 2024-03-28 NOTE — Assessment & Plan Note (Signed)
 Maintained on pantoprazole  once daily. Stable.

## 2024-03-28 NOTE — Assessment & Plan Note (Signed)
Obtain thyroid US ?

## 2024-03-28 NOTE — Assessment & Plan Note (Addendum)
 D/c hydroxyzine , d/c benadryl .  Trial of trazodone . Has not tolerated ambien  in the past due to sleep walking.

## 2024-03-28 NOTE — Assessment & Plan Note (Signed)
>>  ASSESSMENT AND PLAN FOR THYROID  FULLNESS WRITTEN ON 03/28/2024  3:51 PM BY O'SULLIVAN, Jemya Depierro, NP  Obtain thyroid  US .

## 2024-03-29 ENCOUNTER — Telehealth: Payer: Self-pay | Admitting: Family

## 2024-03-29 DIAGNOSIS — E041 Nontoxic single thyroid nodule: Secondary | ICD-10-CM | POA: Insufficient documentation

## 2024-03-29 NOTE — Telephone Encounter (Signed)
 Please advise pt that the ultrasound shows a thyroid  nodule on the right.  The radiologist is recommending that we send her for a thyroid  biopsy and I am placing the order.  Someone from [facility] should reach out to her about scheduling.

## 2024-03-30 NOTE — Telephone Encounter (Signed)
 Patient notified of results and the recommended biopsy. She was made aware they will contact her to schedule.

## 2024-04-04 ENCOUNTER — Other Ambulatory Visit (HOSPITAL_BASED_OUTPATIENT_CLINIC_OR_DEPARTMENT_OTHER): Payer: Self-pay

## 2024-04-04 ENCOUNTER — Other Ambulatory Visit: Payer: Self-pay | Admitting: Family

## 2024-04-04 MED ORDER — ZEPBOUND 10 MG/0.5ML ~~LOC~~ SOAJ
10.0000 mg | SUBCUTANEOUS | 2 refills | Status: DC
  Filled 2024-04-04: qty 2, 28d supply, fill #0
  Filled 2024-04-30: qty 2, 28d supply, fill #1
  Filled 2024-05-31: qty 2, 28d supply, fill #2

## 2024-04-09 ENCOUNTER — Other Ambulatory Visit (HOSPITAL_COMMUNITY)
Admission: RE | Admit: 2024-04-09 | Discharge: 2024-04-09 | Disposition: A | Discharge status: Home / Self Care | Source: Ambulatory Visit | Attending: Radiology | Admitting: Radiology

## 2024-04-09 ENCOUNTER — Ambulatory Visit
Admission: RE | Admit: 2024-04-09 | Discharge: 2024-04-09 | Disposition: A | Discharge status: Home / Self Care | Source: Ambulatory Visit | Attending: Family | Admitting: Family

## 2024-04-09 DIAGNOSIS — E041 Nontoxic single thyroid nodule: Secondary | ICD-10-CM | POA: Diagnosis present

## 2024-04-11 ENCOUNTER — Encounter: Payer: Self-pay | Admitting: Family

## 2024-04-11 LAB — CYTOLOGY - NON PAP

## 2024-04-14 ENCOUNTER — Other Ambulatory Visit: Payer: Self-pay

## 2024-04-14 ENCOUNTER — Emergency Department (HOSPITAL_BASED_OUTPATIENT_CLINIC_OR_DEPARTMENT_OTHER)
Admission: EM | Admit: 2024-04-14 | Discharge: 2024-04-14 | Disposition: A | Discharge status: Home / Self Care | Attending: Emergency Medicine | Admitting: Emergency Medicine

## 2024-04-14 ENCOUNTER — Encounter (HOSPITAL_BASED_OUTPATIENT_CLINIC_OR_DEPARTMENT_OTHER): Payer: Self-pay | Admitting: Emergency Medicine

## 2024-04-14 DIAGNOSIS — M545 Low back pain, unspecified: Secondary | ICD-10-CM

## 2024-04-14 MED ORDER — HYDROCODONE-ACETAMINOPHEN 5-325 MG PO TABS
1.0000 | ORAL_TABLET | Freq: Once | ORAL | Status: AC
  Administered 2024-04-14: 1 via ORAL
  Filled 2024-04-14: qty 1

## 2024-04-14 MED ORDER — METHOCARBAMOL 500 MG PO TABS: 500.0000 mg | ORAL_TABLET | Freq: Two times a day (BID) | ORAL | 0 refills | Status: DC

## 2024-04-14 MED ORDER — LIDOCAINE 5 % EX PTCH
1.0000 | MEDICATED_PATCH | CUTANEOUS | Status: DC
  Administered 2024-04-14: 1 via TRANSDERMAL
  Filled 2024-04-14: qty 1

## 2024-04-14 MED ORDER — HYDROCODONE-ACETAMINOPHEN 5-325 MG PO TABS: 2.0000 | ORAL_TABLET | Freq: Four times a day (QID) | ORAL | 0 refills | Status: DC | PRN

## 2024-04-14 MED ORDER — KETOROLAC TROMETHAMINE 15 MG/ML IJ SOLN
15.0000 mg | Freq: Once | INTRAMUSCULAR | Status: AC
  Administered 2024-04-14: 15 mg via INTRAMUSCULAR
  Filled 2024-04-14: qty 1

## 2024-04-14 MED ORDER — PREDNISONE 20 MG PO TABS: 40.0000 mg | ORAL_TABLET | Freq: Every day | ORAL | 0 refills | Status: DC

## 2024-04-14 MED ORDER — LIDOCAINE 5 % EX PTCH: 1.0000 | MEDICATED_PATCH | CUTANEOUS | 0 refills | Status: DC

## 2024-04-14 NOTE — Discharge Instructions (Addendum)
 Please follow up with orthopedic provider who has seen you in the past for your disc, call to schedule apt. Return with new or worsening symptoms.   Take steriods for 5 days, after steroid course complete you can take Ibuprofen .  Also use lidocaine  patch and heating pad. You can take 1000mg  of tylenol  every 8 hours.  You can also use Robaxin  for muscle spasm, do not drive or operate heavy machinery when using this medicine.  Take Norco for breakthrough pain.

## 2024-04-14 NOTE — ED Triage Notes (Signed)
 Pt c/o lower back pain x 2 wks that became significantly worse today; hx of bulging discs

## 2024-04-14 NOTE — ED Provider Notes (Signed)
 [place] [facility] Provider Note   CSN: [id number] Arrival date & time: 04/14/24  1405     History  Chief Complaint  Patient presents with   Back Pain    Dana Carpenter is a 48 y.o. female. With past medical history of PE not on BT, migraine, obesity  presenting with complaint of back pain.  Patient reports she has intermittently dealt with low back pain for years.  Reports that today she was bending over at the sink for extended period of time and when she went to stand back up straight she experienced severe pain in left lower back.  She denies any other injury trauma or fall.  She does not have radiating symptoms.  The pain is better when she is leaning slightly forward and worse with twisting or extending all the way back.  Tried heating pack today.  In the past was given muscle relaxers, steroids and followed up with an orthopedic doctor.  He did and her left low back.  She denies any saddle anesthesia.  No loss of bowel or bladder.  No fever or history of IV drug use.  No history of cancer.   Back Pain      Home Medications Prior to Admission medications   Medication Sig Start Date End Date Taking? Authorizing Provider  betamethasone  dipropionate 0.05 % cream Apply topically 2 (two) times daily. 08/26/23   O'Sullivan, Melissa, NP  fluticasone  (FLONASE ) 50 MCG/ACT nasal spray Place 2 sprays into both nostrils daily. 03/19/24   Dorrene Gaucher, NP  linaclotide  (LINZESS ) 145 MCG CAPS capsule Take 1 capsule (145 mcg total) by mouth daily 30 minutes before a meal. 03/28/24   Dorrene Gaucher, NP  meloxicam  (MOBIC ) 7.5 MG tablet Take 1 tablet (7.5 mg total) by mouth daily. 08/26/23   O'Sullivan, Melissa, NP  Multiple Vitamin (MULTIVITAMIN) tablet Take 1 tablet by mouth daily.    [provider]  pantoprazole  (PROTONIX ) 40 MG tablet Take 1 tablet (40 mg total) by mouth daily. 11/22/23   O'Sullivan, Melissa, NP  tirzepatide  (ZEPBOUND )  10 MG/0.5ML Pen Inject 10 mg into the skin once a week. 04/04/24   O'Sullivan, Melissa, NP  traZODone  (DESYREL ) 50 MG tablet Take 0.5-1 tablets (25-50 mg total) by mouth at bedtime as needed for sleep. 03/28/24   O'Sullivan, Melissa, NP      Allergies    Patient has no known allergies.    Review of Systems   Review of Systems  Musculoskeletal:  Positive for back pain.    Physical Exam Updated Vital Signs BP 100/67 (BP Location: Right Arm)   Pulse 95   Temp 97.8 F (36.6 C) (Oral)   Resp 16   Ht 5\' 6"  (1.676 m)   Wt 83.9 kg   SpO2 97%   BMI 29.86 kg/m  Physical Exam Vitals and nursing note reviewed.  Constitutional:      General: She is not in acute distress.    Appearance: She is not toxic-appearing.  HENT:     Head: Normocephalic and atraumatic.  Eyes:     General: No scleral icterus.    Conjunctiva/sclera: Conjunctivae normal.  Cardiovascular:     Rate and Rhythm: Normal rate and regular rhythm.     Pulses: Normal pulses.     Heart sounds: Normal heart sounds.  Pulmonary:     Effort: Pulmonary effort is normal. No respiratory distress.     Breath sounds: Normal breath sounds.  Abdominal:  General: Abdomen is flat. Bowel sounds are normal.     Palpations: Abdomen is soft.     Tenderness: There is no abdominal tenderness.  Musculoskeletal:     Comments: Left paravertebral low back tenderness to palpation. Lower extremity sensation and strength is equal bilaterally.  Skin:    General: Skin is warm and dry.     Findings: No lesion.  Neurological:     General: No focal deficit present.     Mental Status: She is alert and oriented to person, place, and time. Mental status is at baseline.     ED Results / Procedures / Treatments   Labs (all labs ordered are listed, but only abnormal results are displayed) Labs Reviewed - No data to display  EKG None  Radiology No results found.  Procedures Procedures    Medications Ordered in ED Medications   lidocaine  (LIDODERM ) 5 % 1 patch (has no administration in time range)  ketorolac  (TORADOL ) 15 MG/ML injection 15 mg (has no administration in time range)  HYDROcodone -acetaminophen  (NORCO/VICODIN) 5-325 MG per tablet 1 tablet (has no administration in time range)  lidocaine  (LIDODERM ) 5 % 1 patch (has no administration in time range)    ED Course/ Medical Decision Making/ A&P                                 Medical Decision Making Risk Prescription drug management.   Dana Carpenter 48 y.o. presented today for back pain. Working Ddx: MSK in nature, fracture, epidural hematoma/abscess, cauda equina syndrome, spinal stenosis, spinal malignancy, discitis, spinal infection, spondylitises/ spondylosis, conus medullaris, DDD of the back.  R/o DDx: Cauda equina syndrome and additional dx are less likely than current impression due to history of present illness, physical exam, labs/imaging findings. No focal neurological deficits, no loss of bowel or bladder control.  Denies fever, night sweats, weight loss, h/o cancer, IVDU.  PMHX: Hx of disc disease   Problem List / ED Course / Critical interventions / Medication management  Presenting to emergency room with low back pain.  Started today after she was standing up from counter.  She has no red flag symptoms associated with her back pain and also has no injury trauma or fall.  Do not feel that emergent imaging is necessary at this time given these reassuring symptoms and no trauma or red flag symptoms. No midline tenderness or deformity. Exam is consistent with muscle strain. No radicular symptoms. No abdominal pain NV, exam is not consistent with kidney stone. No chest pain or shortness of breath. Has appropriate follow up. Feel stable for discharge.  I ordered medication including Toradol , Norco, Lidoderm  patch Reevaluation of the patient after these medicines showed that the patient improved Patients vitals assessed. Upon arrival patient is  hemodynamically stable.  I have reviewed the patients home medicines and have made adjustments as needed   Plan: F/u w/ PCP in 2-3d to ensure resolution of sx.  RICE protocol and pain medicine discussed with patient.  Patient was given return precautions. Patient stable for discharge at this time.  Patient educated on sx/dx and verbalized understanding of plan. Return to ER with new or worsening sx.          Final Clinical Impression(s) / ED Diagnoses Final diagnoses:  Acute left-sided low back pain without sciatica    Rx / DC Orders ED Discharge Orders          Ordered  predniSONE  (DELTASONE ) 20 MG tablet  Daily        04/14/24 1527    methocarbamol  (ROBAXIN ) 500 MG tablet  2 times daily        04/14/24 1527    HYDROcodone -acetaminophen  (NORCO/VICODIN) 5-325 MG tablet  Every 6 hours PRN        04/14/24 1527              Egan Berkheimer, Kandace Organ, PA-C 04/14/24 1540    Mordecai Applebaum, MD 04/14/24 1728

## 2024-05-01 ENCOUNTER — Other Ambulatory Visit (HOSPITAL_BASED_OUTPATIENT_CLINIC_OR_DEPARTMENT_OTHER): Payer: Self-pay

## 2024-06-27 ENCOUNTER — Ambulatory Visit: Admitting: Family Medicine

## 2024-06-28 ENCOUNTER — Other Ambulatory Visit: Payer: Self-pay | Admitting: Family

## 2024-06-28 ENCOUNTER — Other Ambulatory Visit (HOSPITAL_BASED_OUTPATIENT_CLINIC_OR_DEPARTMENT_OTHER): Payer: Self-pay

## 2024-06-28 MED ORDER — ZEPBOUND 10 MG/0.5ML ~~LOC~~ SOAJ
10.0000 mg | SUBCUTANEOUS | 2 refills | Status: DC
  Filled 2024-07-05: qty 2, 28d supply, fill #0
  Filled 2024-08-01 – 2024-08-24 (×5): qty 2, 28d supply, fill #1
  Filled 2024-09-19 – 2024-10-05 (×3): qty 2, 28d supply, fill #2

## 2024-07-02 ENCOUNTER — Other Ambulatory Visit (HOSPITAL_BASED_OUTPATIENT_CLINIC_OR_DEPARTMENT_OTHER): Payer: Self-pay

## 2024-07-03 ENCOUNTER — Other Ambulatory Visit (HOSPITAL_BASED_OUTPATIENT_CLINIC_OR_DEPARTMENT_OTHER): Payer: Self-pay

## 2024-07-05 ENCOUNTER — Other Ambulatory Visit (HOSPITAL_BASED_OUTPATIENT_CLINIC_OR_DEPARTMENT_OTHER): Payer: Self-pay

## 2024-07-06 ENCOUNTER — Ambulatory Visit (INDEPENDENT_AMBULATORY_CARE_PROVIDER_SITE_OTHER): Admitting: Family

## 2024-07-06 VITALS — BP 113/68 | HR 71 | Temp 98.1°F | Resp 16 | Ht 66.0 in | Wt 175.0 lb

## 2024-07-06 DIAGNOSIS — K219 Gastro-esophageal reflux disease without esophagitis: Secondary | ICD-10-CM

## 2024-07-06 DIAGNOSIS — E663 Overweight: Secondary | ICD-10-CM

## 2024-07-06 DIAGNOSIS — G4700 Insomnia, unspecified: Secondary | ICD-10-CM | POA: Diagnosis not present

## 2024-07-06 DIAGNOSIS — E041 Nontoxic single thyroid nodule: Secondary | ICD-10-CM

## 2024-07-06 DIAGNOSIS — K5909 Other constipation: Secondary | ICD-10-CM

## 2024-07-06 DIAGNOSIS — F32A Depression, unspecified: Secondary | ICD-10-CM

## 2024-07-06 NOTE — Patient Instructions (Signed)
 VISIT SUMMARY:  You had a follow-up visit to discuss your medications and overall health. You have lost ten pounds since your last visit, and your BMI is now 28. Your migraines have improved, and your reflux and constipation are being managed with medication. We also discussed your back pain and a thyroid  nodule that will be monitored.  YOUR PLAN:  OVERWEIGHT: You have lost 10 pounds, and your BMI is now 28. Zepbound  10 mg is helping to reduce your appetite with mild side effects. -Continue taking Zepbound  10 mg. -Maintain your current weight loss regimen. -We will discuss a maintenance plan once you reach your goal weight of 160 pounds.  GASTROESOPHAGEAL REFLUX DISEASE (GERD): Your reflux symptoms are well-managed with pantoprazole , but they return if you miss a dose. Zepbound  may also be contributing to your reflux. -Continue taking pantoprazole . -We may consider weaning you off pantoprazole  after you reach your target weight and reduce Zepbound .  CONSTIPATION: Linzess  is effective for your constipation, but skipping doses causes significant stomach pain. -Take Linzess  every evening. -Adjust the dosage if you experience diarrhea.  MIGRAINE: Your migraines have improved, and you only experience occasional mild headaches. -Continue with your current treatment.  RIGHT THYROID  NODULE: You have a 2 cm thyroid  nodule that was previously biopsied and is not causing any current symptoms. -Monitor the thyroid  nodule. -Repeat ultrasound if you develop any increased fullness or swallowing issues.   GENERAL HEALTH MAINTENANCE: We need to monitor your overall health with blood tests. -Order blood tests for sugar and kidney function. -Repeat thyroid  function test.

## 2024-07-06 NOTE — Assessment & Plan Note (Signed)
 Reports that she takes linzess  in the evening every other day.  She plans to increase to every evening.

## 2024-07-06 NOTE — Progress Notes (Signed)
 Subjective:     Patient ID: Dana Carpenter, female    DOB: 11-15-1976, 48 y.o.   MRN: [id number]  Chief Complaint  Patient presents with   Obesity    On zepbound    Depression    Here for follow up    Depression         Discussed the use of AI scribe software for clinical note transcription with the patient, who gave verbal consent to proceed.  History of Present Illness  Dana Carpenter is a 48 year old female with morbid obesity who presents for a follow-up on her medications.  She has lost ten pounds since her last visit and is taking Zepbound  10 mg, which reduces her appetite. Side effects include increased thirst and hair loss, which have stabilized. Her BMI is 28, and she aims to reach a goal weight of 160 pounds.  Migraines have improved, with only occasional mild headaches. Her sleep pattern remains unchanged but feels improved with current treatment.  She takes pantoprazole  for reflux symptoms, which return if she skips doses.  Linzess  is taken every other day in the evening for bowel movements. It is effective, but skipping doses causes significant stomach pain. She sometimes doubles the dose for severe symptoms.      Health Maintenance Due  Topic Date Due   Hepatitis C Screening  Never done   Hepatitis B Vaccines (1 of 3 - 19+ 3-dose series) Never done   INFLUENZA VACCINE  07/06/2024    Past Medical History:  Diagnosis Date   Abnormal uterine bleeding (AUB) 08/21/2013   Acne 11/27/2014   Anxiety    Chronic constipation 01/13/2022   Depression    Family history of heart disease 01/18/2023   GERD (gastroesophageal reflux disease) 02/08/2022   Grief at loss of child 03/27/2014   History of pulmonary embolism 03/26/2013   Insomnia 01/13/2022   Left leg swelling 01/13/2022   Malar rash 11/27/2014   Migraine without status migrainosus, not intractable 01/13/2022   Obesity (BMI 30-39.9) 07/08/2021   Preventative health care 01/18/2023   Rhinorrhea  01/13/2022   Strep pharyngitis    Tinnitus of both ears 01/18/2023    Past Surgical History:  Procedure Laterality Date   DIAGNOSTIC LAPAROSCOPY WITH REMOVAL OF ECTOPIC PREGNANCY N/A 01/27/2017   Procedure: DIAGNOSTIC LAPAROSCOPY WITH RIGHT SALPINGECTOMY FOR RIGHT ECTOPIC PREGNANCY;  Surgeon: Ozell LITTIE Cowman, MD;  Location: [facility];  Service: Gynecology;  Laterality: N/A;   WISDOM TOOTH EXTRACTION Bilateral     Family History  Problem Relation Age of Onset   Heart attack Mother 39       deceased- also had lung disorder (? pulmonary fibrosis)   Lung disease Mother    Hypertension Father    Healthy Sister        x 1   Diabetes Sister 93       type II   Hypertension Sister    Dementia Maternal Grandmother        3   Heart disease Maternal Grandfather    Cardiomyopathy Maternal Grandfather    Breast cancer Paternal Grandmother    Diabetes Paternal Grandfather    Prostate cancer Paternal Grandfather    Heart disease Maternal Aunt        heart disease   Cardiomyopathy Maternal Aunt        heart transplant   Cardiomyopathy Maternal Aunt    Cardiomyopathy Maternal Aunt    Heart disease Cousin  female cousin on mom's side weak heart   Cardiomyopathy Cousin        had heart transplant   Colon polyps Neg Hx    Colon cancer Neg Hx    Esophageal cancer Neg Hx    Stomach cancer Neg Hx    Rectal cancer Neg Hx     Social History   Socioeconomic History   Marital status: Single    Spouse name: Not on file   Number of children: Not on file   Years of education: Not on file   Highest education level: Not on file  Occupational History   Not on file  Tobacco Use   Smoking status: Never   Smokeless tobacco: Never   Tobacco comments:    never used tobacco  Vaping Use   Vaping status: Never Used  Substance and Sexual Activity   Alcohol use: No    Alcohol/week: 0.0 standard drinks of alcohol    Comment: occasional   Drug use: No   Sexual activity: Not on file  Other  Topics Concern   Not on file  Social History Narrative   Lives with 2 sons regan 2008, [name] 2011   One of her son's died in a MVA   Older son born 27- in and out of the home   Boyfriend, but single   Works in [employer]   Enjoys sleeping.   Completed some college   Sister lives locally, most of her family in [place] and has 2 aunts in [place]   Social Drivers of Health   Financial Resource Strain: Not on file  Food Insecurity: Not on file  Transportation Needs: Not on file  Physical Activity: Not on file  Stress: Not on file  Social Connections: Unknown (09/08/2022)   Received from [facility]   Social Network    Social Network: Not on file  Intimate Partner Violence: Unknown (09/08/2022)   Received from [facility]   HITS    Physically Hurt: Not on file    Insult or Talk Down To: Not on file    Threaten Physical Harm: Not on file    Scream or Curse: Not on file    Outpatient Medications Prior to Visit  Medication Sig Dispense Refill   fluticasone  (FLONASE ) 50 MCG/ACT nasal spray Place 2 sprays into both nostrils daily. 16 g 6   linaclotide  (LINZESS ) 145 MCG CAPS capsule Take 1 capsule (145 mcg total) by mouth daily 30 minutes before a meal. 90 capsule 1   Multiple Vitamin (MULTIVITAMIN) tablet Take 1 tablet by mouth daily.     pantoprazole  (PROTONIX ) 40 MG tablet Take 1 tablet (40 mg total) by mouth daily. 90 tablet 1   tirzepatide  (ZEPBOUND ) 10 MG/0.5ML Pen Inject 10 mg into the skin once a week. 2 mL 2   traZODone  (DESYREL ) 50 MG tablet Take 0.5-1 tablets (25-50 mg total) by mouth at bedtime as needed for sleep. 30 tablet 3   betamethasone  dipropionate 0.05 % cream Apply topically 2 (two) times daily. 30 g 0   HYDROcodone -acetaminophen  (NORCO/VICODIN) 5-325 MG tablet Take 2 tablets by mouth every 6 (six) hours as needed for severe pain (pain score 7-10). 10 tablet 0   lidocaine  (LIDODERM ) 5 % Place 1 patch onto the skin daily. Remove &  Discard patch within 12 hours or as directed by MD 30 patch 0   meloxicam  (MOBIC ) 7.5 MG tablet Take 1 tablet (7.5 mg total) by mouth daily. 14 tablet 0   methocarbamol  (  ROBAXIN ) 500 MG tablet Take 1 tablet (500 mg total) by mouth 2 (two) times daily. 20 tablet 0   predniSONE  (DELTASONE ) 20 MG tablet Take 2 tablets (40 mg total) by mouth daily. 10 tablet 0   No facility-administered medications prior to visit.    No Known Allergies  Review of Systems  Psychiatric/Behavioral:  Positive for depression.        Objective:    Physical Exam Constitutional:      General: She is not in acute distress.    Appearance: Normal appearance. She is well-developed.  HENT:     Head: Normocephalic and atraumatic.     Right Ear: External ear normal.     Left Ear: External ear normal.  Eyes:     General: No scleral icterus. Neck:     Thyroid : Thyromegaly (right thyroid  only) present.  Cardiovascular:     Rate and Rhythm: Normal rate and regular rhythm.     Heart sounds: Normal heart sounds. No murmur heard. Pulmonary:     Effort: Pulmonary effort is normal. No respiratory distress.     Breath sounds: Normal breath sounds. No wheezing.  Musculoskeletal:     Cervical back: Neck supple.  Skin:    General: Skin is warm and dry.  Neurological:     Mental Status: She is alert and oriented to person, place, and time.  Psychiatric:        Mood and Affect: Mood normal.        Behavior: Behavior normal.        Thought Content: Thought content normal.        Judgment: Judgment normal.      BP 113/68 (BP Location: Right Arm, Patient Position: Sitting, Cuff Size: Normal)   Pulse 71   Temp 98.1 F (36.7 C) (Oral)   Resp 16   Ht 5' 6 (1.676 m)   Wt 175 lb (79.4 kg)   SpO2 100%   BMI 28.25 kg/m  Wt Readings from Last 3 Encounters:  07/06/24 175 lb (79.4 kg)  04/14/24 185 lb (83.9 kg)  03/28/24 185 lb (83.9 kg)       Assessment & Plan:   Problem List Items Addressed This Visit        Unprioritized   Thyroid  nodule   2 cm right upper on US ,  s/p FNA - Benign follicular nodule (Bethesda category II)       Relevant Orders   TSH   Overweight (BMI 25.0-29.9) - Primary   Improving.  Continue wegovy 10mg . Goal weight 160lb.      Relevant Orders   Basic Metabolic Panel (BMET)   Insomnia   Some improvement with trazodone .  Continue same.       GERD (gastroesophageal reflux disease)   Stable on pantoprazole - does not tolerate not taking.       Depression   Mood is stable.       Chronic constipation   Reports that she takes linzess  in the evening every other day.  She plans to increase to every evening.        I have discontinued Parneet N. Bolds's meloxicam , betamethasone  dipropionate, predniSONE , methocarbamol , HYDROcodone -acetaminophen , and lidocaine . I am also having her maintain her multivitamin, pantoprazole , fluticasone , linaclotide , traZODone , and Zepbound .  No orders of the defined types were placed in this encounter.

## 2024-07-06 NOTE — Assessment & Plan Note (Signed)
 Stable on pantoprazole - does not tolerate not taking.

## 2024-07-06 NOTE — Assessment & Plan Note (Signed)
 2 cm right upper on US ,  s/p FNA - Benign follicular nodule (Bethesda category II)

## 2024-07-06 NOTE — Assessment & Plan Note (Signed)
 Mild headaches- no migraines. Monitor.

## 2024-07-06 NOTE — Assessment & Plan Note (Signed)
 Improving.  Continue wegovy 10mg . Goal weight 160lb.

## 2024-07-06 NOTE — Assessment & Plan Note (Signed)
 Some improvement with trazodone .  Continue same.

## 2024-07-06 NOTE — Assessment & Plan Note (Signed)
 Mood is stable.

## 2024-07-09 ENCOUNTER — Ambulatory Visit: Payer: Self-pay | Admitting: Family

## 2024-07-09 LAB — BASIC METABOLIC PANEL WITH GFR
BUN: 15 mg/dL (ref 7–25)
CO2: 23 mmol/L (ref 20–32)
Calcium: 9.2 mg/dL (ref 8.6–10.2)
Chloride: 104 mmol/L (ref 98–110)
Creat: 0.85 mg/dL (ref 0.50–0.99)
Glucose, Bld: 61 mg/dL — ABNORMAL LOW (ref 65–99)
Potassium: 4 mmol/L (ref 3.5–5.3)
Sodium: 138 mmol/L (ref 135–146)
eGFR: 85 mL/min/1.73m2 (ref >=60)

## 2024-07-09 LAB — TSH: TSH: 1 m[IU]/L

## 2024-08-01 ENCOUNTER — Other Ambulatory Visit (HOSPITAL_BASED_OUTPATIENT_CLINIC_OR_DEPARTMENT_OTHER): Payer: Self-pay

## 2024-08-01 ENCOUNTER — Other Ambulatory Visit: Payer: Self-pay

## 2024-08-01 ENCOUNTER — Other Ambulatory Visit: Payer: Self-pay | Admitting: Family

## 2024-08-01 MED ORDER — PANTOPRAZOLE SODIUM 40 MG PO TBEC
40.0000 mg | DELAYED_RELEASE_TABLET | Freq: Every day | ORAL | 1 refills | Status: DC
  Filled 2024-08-01: qty 90, 90d supply, fill #0
  Filled 2024-11-29: qty 90, 90d supply, fill #1

## 2024-08-02 ENCOUNTER — Other Ambulatory Visit (HOSPITAL_BASED_OUTPATIENT_CLINIC_OR_DEPARTMENT_OTHER): Payer: Self-pay

## 2024-08-03 ENCOUNTER — Other Ambulatory Visit (HOSPITAL_COMMUNITY): Payer: Self-pay

## 2024-08-03 ENCOUNTER — Other Ambulatory Visit (HOSPITAL_BASED_OUTPATIENT_CLINIC_OR_DEPARTMENT_OTHER): Payer: Self-pay

## 2024-08-07 ENCOUNTER — Other Ambulatory Visit (HOSPITAL_BASED_OUTPATIENT_CLINIC_OR_DEPARTMENT_OTHER): Payer: Self-pay

## 2024-08-08 ENCOUNTER — Other Ambulatory Visit (HOSPITAL_BASED_OUTPATIENT_CLINIC_OR_DEPARTMENT_OTHER): Payer: Self-pay

## 2024-08-09 ENCOUNTER — Other Ambulatory Visit (HOSPITAL_BASED_OUTPATIENT_CLINIC_OR_DEPARTMENT_OTHER): Payer: Self-pay

## 2024-08-10 ENCOUNTER — Other Ambulatory Visit (HOSPITAL_BASED_OUTPATIENT_CLINIC_OR_DEPARTMENT_OTHER): Payer: Self-pay

## 2024-08-13 ENCOUNTER — Other Ambulatory Visit (HOSPITAL_BASED_OUTPATIENT_CLINIC_OR_DEPARTMENT_OTHER): Payer: Self-pay

## 2024-08-14 ENCOUNTER — Other Ambulatory Visit (HOSPITAL_BASED_OUTPATIENT_CLINIC_OR_DEPARTMENT_OTHER): Payer: Self-pay

## 2024-08-15 ENCOUNTER — Other Ambulatory Visit (HOSPITAL_COMMUNITY): Payer: Self-pay

## 2024-08-17 ENCOUNTER — Other Ambulatory Visit (HOSPITAL_BASED_OUTPATIENT_CLINIC_OR_DEPARTMENT_OTHER): Payer: Self-pay

## 2024-08-17 ENCOUNTER — Other Ambulatory Visit: Payer: Self-pay

## 2024-08-20 ENCOUNTER — Other Ambulatory Visit (HOSPITAL_BASED_OUTPATIENT_CLINIC_OR_DEPARTMENT_OTHER): Payer: Self-pay

## 2024-08-21 ENCOUNTER — Other Ambulatory Visit (HOSPITAL_BASED_OUTPATIENT_CLINIC_OR_DEPARTMENT_OTHER): Payer: Self-pay

## 2024-08-29 ENCOUNTER — Telehealth: Payer: Self-pay | Admitting: Family

## 2024-08-29 DIAGNOSIS — Z8249 Family history of ischemic heart disease and other diseases of the circulatory system: Secondary | ICD-10-CM

## 2024-08-29 NOTE — Telephone Encounter (Signed)
 Copied from CRM [id number]. Topic: Referral - Request for Referral >> Aug 29, 2024  9:01 AM Berneda FALCON wrote: Did the patient discuss referral with their provider in the last year? Yes (If No - schedule appointment) (If Yes - send message)  Appointment offered? Yes, she has already spoke to [facility] about this previously  Type of order/referral and detailed reason for visit: Pt would like an appt to cardiology-she saw a couple of her family members have a genetic gene and would like to get tested for this gene  Preference of office, provider, location: Whoever she recommends is fine, [place] is preferred  If referral order, have you been seen by this specialty before? No (If Yes, this issue or another issue? When? Where?  Can we respond through MyChart? Yes

## 2024-08-30 NOTE — Addendum Note (Signed)
 Addended by: DARYL SETTER on: 08/30/2024 10:45 AM   Modules accepted: Orders

## 2024-09-19 ENCOUNTER — Other Ambulatory Visit (HOSPITAL_BASED_OUTPATIENT_CLINIC_OR_DEPARTMENT_OTHER): Payer: Self-pay

## 2024-09-20 ENCOUNTER — Other Ambulatory Visit (HOSPITAL_BASED_OUTPATIENT_CLINIC_OR_DEPARTMENT_OTHER): Payer: Self-pay

## 2024-09-21 ENCOUNTER — Other Ambulatory Visit (HOSPITAL_BASED_OUTPATIENT_CLINIC_OR_DEPARTMENT_OTHER): Payer: Self-pay

## 2024-09-25 ENCOUNTER — Other Ambulatory Visit (HOSPITAL_BASED_OUTPATIENT_CLINIC_OR_DEPARTMENT_OTHER): Payer: Self-pay

## 2024-09-26 ENCOUNTER — Other Ambulatory Visit: Payer: Self-pay | Admitting: Family

## 2024-09-26 ENCOUNTER — Other Ambulatory Visit: Payer: Self-pay

## 2024-09-26 ENCOUNTER — Other Ambulatory Visit (HOSPITAL_BASED_OUTPATIENT_CLINIC_OR_DEPARTMENT_OTHER): Payer: Self-pay

## 2024-09-26 DIAGNOSIS — K581 Irritable bowel syndrome with constipation: Secondary | ICD-10-CM

## 2024-09-26 MED ORDER — LINACLOTIDE 145 MCG PO CAPS
145.0000 ug | ORAL_CAPSULE | Freq: Every day | ORAL | 1 refills | Status: DC
  Filled 2024-09-26: qty 90, 90d supply, fill #0

## 2024-09-27 ENCOUNTER — Other Ambulatory Visit: Payer: Self-pay

## 2024-09-27 ENCOUNTER — Other Ambulatory Visit (HOSPITAL_BASED_OUTPATIENT_CLINIC_OR_DEPARTMENT_OTHER): Payer: Self-pay

## 2024-10-02 ENCOUNTER — Encounter: Payer: Self-pay | Admitting: Family

## 2024-10-03 ENCOUNTER — Telehealth: Payer: Self-pay

## 2024-10-03 ENCOUNTER — Other Ambulatory Visit (HOSPITAL_COMMUNITY): Payer: Self-pay

## 2024-10-03 NOTE — Telephone Encounter (Signed)
 Pharmacy Patient Advocate Encounter   Received notification from Patient Advice Request messages that prior authorization for Zepbound  10mg /0.57ml is required/requested.   Insurance verification completed.   The patient is insured through [employer].   Per test claim: PA required; PA submitted to above mentioned insurance via Latent Key/confirmation #/EOC [id number] Status is pending

## 2024-10-05 ENCOUNTER — Other Ambulatory Visit (HOSPITAL_COMMUNITY): Payer: Self-pay

## 2024-10-05 ENCOUNTER — Other Ambulatory Visit (HOSPITAL_BASED_OUTPATIENT_CLINIC_OR_DEPARTMENT_OTHER): Payer: Self-pay

## 2024-10-05 NOTE — Telephone Encounter (Signed)
 Pharmacy Patient Advocate Encounter  Received notification from [facility] that Prior Authorization for Zepbound  10mg /0.69ml has been APPROVED from 09/03/24 to 10/05/25. Ran test claim, Copay is $25. This test claim was processed through [facility]- copay amounts may vary at other pharmacies due to pharmacy/plan contracts, or as the patient moves through the different stages of their insurance plan.   PA #/Case ID/Reference #: [id number]  Left a message at [facility] pharmacy to notify of the approval.

## 2024-10-08 ENCOUNTER — Other Ambulatory Visit (HOSPITAL_BASED_OUTPATIENT_CLINIC_OR_DEPARTMENT_OTHER): Payer: Self-pay

## 2024-10-16 ENCOUNTER — Ambulatory Visit: Admitting: Obstetrics and Gynecology

## 2024-10-16 ENCOUNTER — Other Ambulatory Visit (HOSPITAL_COMMUNITY)
Admission: RE | Admit: 2024-10-16 | Discharge: 2024-10-16 | Disposition: A | Discharge status: Home / Self Care | Source: Ambulatory Visit | Attending: Obstetrics and Gynecology | Admitting: Obstetrics and Gynecology

## 2024-10-16 VITALS — BP 123/77 | HR 76 | Ht 66.0 in | Wt 182.0 lb

## 2024-10-16 DIAGNOSIS — N898 Other specified noninflammatory disorders of vagina: Secondary | ICD-10-CM | POA: Insufficient documentation

## 2024-10-16 DIAGNOSIS — N920 Excessive and frequent menstruation with regular cycle: Secondary | ICD-10-CM

## 2024-10-16 DIAGNOSIS — R5383 Other fatigue: Secondary | ICD-10-CM

## 2024-10-16 DIAGNOSIS — R37 Sexual dysfunction, unspecified: Secondary | ICD-10-CM | POA: Diagnosis not present

## 2024-10-16 NOTE — Progress Notes (Signed)
   GYNECOLOGY PROGRESS NOTE  History:  48 y.o. [id number] presents to [facility] Very little sensitivity in clitoris, unable to be aroused, has desire, declines low libido, denies pain with intercourse  She reports her body feels body all over the place, she endorses she is always tired, even with sleep. She endorses hair thinning. She also endorses vaginal itching.   Regular period monthly last 6 days, but endorses very heavy bleeding , having to change pad every hour, desires treatment for this.   She has history of PE in 2014 on OCPs   Health Maintenance Due  Topic Date Due   Hepatitis C Screening  Never done   Hepatitis B Vaccines 19-59 Average Risk (1 of 3 - 19+ 3-dose series) Never done   Mammogram  02/04/2024   Influenza Vaccine  07/06/2024   COVID-19 Vaccine (3 - 2025-26 season) 08/06/2024     Review of Systems:  Pertinent items are noted in HPI.   Objective:  Physical Exam Blood pressure 123/77, pulse 76, height 5' 6 (1.676 m), weight 182 lb 0.6 oz (82.6 kg), last menstrual period 09/28/2024. VS reviewed, nursing note reviewed,  Constitutional: well developed, well nourished, no distress HEENT: normocephalic Pulm/chest wall: normal effort Neuro: alert and oriented  Skin: warm, dry Psych: affect normal Pelvic exam: deferred  Assessment & Plan:  1. Menorrhagia with regular cycle (Primary) Has history of PE in 2014 on OCPs, unremarkable hypercoag work up  Previously counseled on birth control options given hx, she is interested in hormonal IUD ( Mirena), desires to come back for IUD insertion with cervical block  2. Sexual dysfunction Denies low libido, reports limited stimulation, denies pain  Discussed potential topical treatments, will follow up after visit with options   3. Vaginal itching Self swab collected  - Cervicovaginal ancillary only( [place])  4. Other fatigue  - Vitamin D  (25 hydroxy) - Vitamin B12 - CBC - Vitamin B12    Shermaine Rivet,  FNP

## 2024-10-17 LAB — VITAMIN D 25 HYDROXY (VIT D DEFICIENCY, FRACTURES): Vit D, 25-Hydroxy: 81.1 ng/mL (ref 30.0–100.0)

## 2024-10-17 LAB — CBC
Hematocrit: 46.8 % — ABNORMAL HIGH (ref 34.0–46.6)
Hemoglobin: 14.4 g/dL (ref 11.1–15.9)
MCH: 27.3 pg (ref 26.6–33.0)
MCHC: 30.8 g/dL — ABNORMAL LOW (ref 31.5–35.7)
MCV: 89 fL (ref 79–97)
Platelets: 273 x10E3/uL (ref 150–450)
RBC: 5.27 x10E6/uL (ref 3.77–5.28)
RDW: 13 % (ref 11.7–15.4)
WBC: 6.6 x10E3/uL (ref 3.4–10.8)

## 2024-10-17 LAB — VITAMIN B12: Vitamin B-12: 1131 pg/mL (ref 232–1245)

## 2024-10-18 ENCOUNTER — Other Ambulatory Visit: Payer: Self-pay

## 2024-10-18 DIAGNOSIS — B9689 Other specified bacterial agents as the cause of diseases classified elsewhere: Secondary | ICD-10-CM

## 2024-10-18 DIAGNOSIS — B379 Candidiasis, unspecified: Secondary | ICD-10-CM

## 2024-10-18 LAB — CERVICOVAGINAL ANCILLARY ONLY
Bacterial Vaginitis (gardnerella): POSITIVE — AB
Candida Glabrata: NEGATIVE
Candida Vaginitis: POSITIVE — AB
Comment: NEGATIVE
Comment: NEGATIVE
Comment: NEGATIVE

## 2024-10-18 MED ORDER — METRONIDAZOLE 500 MG PO TABS: 500.0000 mg | ORAL_TABLET | Freq: Two times a day (BID) | ORAL | 0 refills | Status: DC

## 2024-10-18 MED ORDER — FLUCONAZOLE 150 MG PO TABS: 150.0000 mg | ORAL_TABLET | Freq: Once | ORAL | 0 refills | Status: AC

## 2024-10-21 ENCOUNTER — Ambulatory Visit: Payer: Self-pay | Admitting: Obstetrics and Gynecology

## 2024-10-29 ENCOUNTER — Other Ambulatory Visit (HOSPITAL_BASED_OUTPATIENT_CLINIC_OR_DEPARTMENT_OTHER): Payer: Self-pay

## 2024-10-29 ENCOUNTER — Other Ambulatory Visit: Payer: Self-pay | Admitting: Family

## 2024-10-29 MED ORDER — ZEPBOUND 10 MG/0.5ML ~~LOC~~ SOAJ
10.0000 mg | SUBCUTANEOUS | 1 refills | Status: DC
  Filled 2024-11-05: qty 2, 28d supply, fill #0
  Filled 2024-11-29: qty 2, 28d supply, fill #1

## 2024-11-05 ENCOUNTER — Other Ambulatory Visit: Payer: Self-pay

## 2024-11-05 ENCOUNTER — Other Ambulatory Visit (HOSPITAL_BASED_OUTPATIENT_CLINIC_OR_DEPARTMENT_OTHER): Payer: Self-pay

## 2024-11-12 ENCOUNTER — Ambulatory Visit: Admitting: Obstetrics and Gynecology

## 2024-11-12 VITALS — BP 116/73 | HR 81 | Ht 66.0 in | Wt 176.0 lb

## 2024-11-12 DIAGNOSIS — R37 Sexual dysfunction, unspecified: Secondary | ICD-10-CM

## 2024-11-12 DIAGNOSIS — Z86711 Personal history of pulmonary embolism: Secondary | ICD-10-CM

## 2024-11-12 DIAGNOSIS — N920 Excessive and frequent menstruation with regular cycle: Secondary | ICD-10-CM

## 2024-11-12 NOTE — Progress Notes (Signed)
   ESTABLISHED GYNECOLOGY VISIT Chief Complaint  Patient presents with   Contraception    Placing IUD     Subjective:  Dana Carpenter is a 48 y.o. [id number] presenting for IUD.  She is uncertain which she wants. Would like IUD for both contraception and management of her heavy periods. Has history of PE, neg hypercoagulable workup. Not on anticoagulation at this time.  Had unprotected sex 2 days ago  Also reports difficulty achieving orgasm. Uses a vibrator for self stimulation and achieves orgasm. Difficulty communicating her needs to her partner.    Review of Systems:   Pertinent items are noted in HPI  Pertinent History Reviewed:  Reviewed past medical,surgical, social and family history.  Reviewed problem list, medications and allergies.  Objective:   Vitals:   11/12/24 1421  BP: 116/73  Pulse: 81  Weight: 176 lb (79.8 kg)  Height: 5' 6 (1.676 m)   Physical Examination:   General appearance - well appearing, and in no distress  Mental status - alert, oriented to person, place, and time  Psych:  normal mood and affect    Assessment and Plan:  1. Menorrhagia with regular cycle (Primary)  Counseled on options for management of menorrhagia with IUDs. Reviewed LNG IUD is acceptable in women with hx of PE. Patient is interested in this. Unfortunately she had unprotected sex two days ago. I recommend that she return on her period or after her next period and do not have unprotected sex before insertion.   2. Sexual dysfunction Counseled on lubricants, communication with her partner, introducing vibrator into sexual intercourse with her partner, vaginal moisturizers  3. History of pulmonary embolism See above    Rollo Dana Bring, MD, FACOG Obstetrician & Gynecologist, [facility] for [facility], [facility]

## 2024-11-29 ENCOUNTER — Other Ambulatory Visit: Payer: Self-pay | Admitting: Family

## 2024-11-29 DIAGNOSIS — G4700 Insomnia, unspecified: Secondary | ICD-10-CM

## 2024-11-30 ENCOUNTER — Other Ambulatory Visit (HOSPITAL_BASED_OUTPATIENT_CLINIC_OR_DEPARTMENT_OTHER): Payer: Self-pay

## 2024-11-30 ENCOUNTER — Other Ambulatory Visit: Payer: Self-pay

## 2024-11-30 MED ORDER — TRAZODONE HCL 50 MG PO TABS
25.0000 mg | ORAL_TABLET | Freq: Every evening | ORAL | 3 refills | Status: AC | PRN
  Filled 2024-11-30: qty 30, 30d supply, fill #0
  Filled 2024-12-26: qty 30, 30d supply, fill #1

## 2024-12-13 ENCOUNTER — Encounter (HOSPITAL_BASED_OUTPATIENT_CLINIC_OR_DEPARTMENT_OTHER): Payer: Self-pay | Admitting: Nurse Practitioner

## 2024-12-13 ENCOUNTER — Encounter (HOSPITAL_BASED_OUTPATIENT_CLINIC_OR_DEPARTMENT_OTHER): Payer: Self-pay

## 2024-12-13 ENCOUNTER — Ambulatory Visit (HOSPITAL_BASED_OUTPATIENT_CLINIC_OR_DEPARTMENT_OTHER): Admitting: Nurse Practitioner

## 2024-12-13 VITALS — BP 108/72 | HR 80 | Ht 67.0 in | Wt 171.0 lb

## 2024-12-13 DIAGNOSIS — R55 Syncope and collapse: Secondary | ICD-10-CM

## 2024-12-13 DIAGNOSIS — Z7189 Other specified counseling: Secondary | ICD-10-CM

## 2024-12-13 DIAGNOSIS — Z8241 Family history of sudden cardiac death: Secondary | ICD-10-CM

## 2024-12-13 DIAGNOSIS — Z7689 Persons encountering health services in other specified circumstances: Secondary | ICD-10-CM

## 2024-12-13 DIAGNOSIS — R9431 Abnormal electrocardiogram [ECG] [EKG]: Secondary | ICD-10-CM | POA: Diagnosis not present

## 2024-12-13 DIAGNOSIS — Z13220 Encounter for screening for lipoid disorders: Secondary | ICD-10-CM | POA: Diagnosis not present

## 2024-12-13 NOTE — Progress Notes (Signed)
 " Cardiology Office Note:  .   Date:  12/13/2024 ID:  Dana Carpenter, DOB 02/18/1976, MRN [id number] PCP: Daryl Setter, NP [facility] Providers Cardiologist:  None   Patient Profile: .      PMH Family history of heart disease Depression Overweight Pulmonary embolism in 2014       History of Present Illness: .    History of Present Illness Dana Carpenter is a very pleasant 49 year old female who presents to establish care due to family history of heart and kidney issues. Strong family history of sudden cardiac death in her mother and and one of her mother's siblings, and three surviving siblings have required heart and kidney transplants. Her mother died at age 70 from a heart attack, and multiple aunts and a cousin have had heart transplants. She is unsure if her mother had a known genetic condition and is concerned about inherited risk. History of pulmonary embolism in 2014 found incidentally during evaluation for back pain. She was hospitalized and treated with blood thinners for one year. Over the past six months she has had dizziness and falls, usually when rising from sitting or lying, especially at night. She does not check blood pressure at home. She has been on Zepbound  for over a year but these symptoms only started recently.  She reports decreased appetite and admits she does not always make the healthiest food choices.  She is currently not exercising despite prior regular activity. She reports leg swelling but no exertional chest pain, shortness of breath, or palpitations.  Generally does not sleep well.  No history of smoking or diabetes.  She is unaware of any family history of high cholesterol.  Family history: Her family history includes Breast cancer in her paternal grandmother; Cardiomyopathy in her cousin, maternal aunt, maternal aunt, maternal aunt, and maternal grandfather; Dementia in her maternal grandmother; Diabetes in her paternal grandfather; Diabetes  (age of onset: 11) in her sister; Healthy in her sister; Heart attack (age of onset: 8) in her mother; Heart disease in her cousin, maternal aunt, and maternal grandfather; Hypertension in her father and sister; Lung disease in her mother; Prostate cancer in her paternal grandfather.   Three aunts have had heart transplant Mother passed away age 35 from heart attack  Discussed the use of AI scribe software for clinical note transcription with the patient, who gave verbal consent to proceed.  ASCVD Risk Score: The 10-year ASCVD risk score (Arnett DK, et al., 2019) is: 0.7%   Values used to calculate the score:     Age: 75 years     Clinically relevant sex: Female     Is Non-Hispanic African American: Yes     Diabetic: No     Tobacco smoker: No     Systolic Blood Pressure: 108 mmHg     Is BP treated: No     HDL Cholesterol: 47.4 mg/dL     Total Cholesterol: 150 mg/dL    Diet: Diminished on Zepbound  Admits diet is generally not healthy  Activity: Some walking Works in [employer] plays basketball  No results found for: LIPOA   ROS: See HPI       Studies Reviewed: SABRA   EKG Interpretation Date/Time:  Thursday December 13 2024 09:25:06 EST Ventricular Rate:  80 PR Interval:  146 QRS Duration:  86 QT Interval:  342 QTC Calculation: 394 R Axis:   58  Text Interpretation: Normal sinus rhythm Septal infarct , age undetermined Confirmed  by Percy Browning [phone]) on 12/13/2024 9:40:24 AM      Risk Assessment/Calculations:             Physical Exam:   VS: BP 108/72 (BP Location: Right Arm, Patient Position: Sitting, Cuff Size: Normal)   Pulse 80   Ht 5' 7 (1.702 m)   Wt 171 lb (77.6 kg)   SpO2 99%   BMI 26.78 kg/m   Wt Readings from Last 3 Encounters:  12/13/24 171 lb (77.6 kg)  11/12/24 176 lb (79.8 kg)  10/16/24 182 lb 0.6 oz (82.6 kg)     GEN: Well nourished, well developed in no acute distress NECK: No JVD; No carotid bruits CARDIAC: RRR, no murmurs,  rubs, gallops RESPIRATORY:  Clear to auscultation without rales, wheezing or rhonchi  ABDOMEN: Soft, non-tender, non-distended EXTREMITIES:  No edema; No deformity     ASSESSMENT AND PLAN: .    Assessment & Plan Family history of heart disease Abnormal EKG   Cardiac risk Family history indicates a potential genetic predisposition for sudden cardiac death. Multiple family members on her mother's side, have died prematurely, including her mother at age 82.  Other siblings have undergone heart transplant. She does not have specific details of the type of cardiovascular disease her family suffered from.  We reviewed EKG from today which revealed normal sinus rhythm with septal infarct age undetermined, advised possibly due to breast tissue.  She has not had symptoms concerning for MI.  She specifically asks about genetic testing we will pursue.  No history of hyperlipidemia.  Last LDL for my review was 88 from 01/2022. Recommendations for heart healthy lifestyle were discussed. - Ordered an echocardiogram to assess heart structure and valve function - Get fasting lipid panel at earliest convenience - Referral placed to genetics counselor, Dr. Danford Pac - Heart healthy diet avoiding processed foods, saturated fat, sugar, and other simple carbohydrates encouraged - Be as physically active as possible every day and aim for at least 150 minutes of moderate intensity exercise each week  Syncope Reports recent episodes of falling during the night after getting out of the bed and laying position.  She reports finding herself on the floor but it is unclear if she has experienced loss of consciousness.  She notes lightheadedness at other times.  She does not monitor BP at home.  BP in clinic is 108/72, HR 80 bpm.  She is not on AV nodal blocking agents or antihypertensive therapy. No tachycardia or palpitations.  - Echo for evaluation of structural heart disease - Consider cardiac monitor if symptoms  persist       Dispo: TBD based on test results  Signed, Browning Percy, NP-C "

## 2024-12-13 NOTE — Patient Instructions (Signed)
 Medication Instructions:   Your physician recommends that you continue on your current medications as directed. Please refer to the Current Medication list given to you today.   *If you need a refill on your cardiac medications before your next appointment, please call your pharmacy*  Lab Work:  Ordered for patient in system for Dr. Danford.  If you have labs (blood work) drawn today and your tests are completely normal, you will receive your results only by: MyChart Message (if you have MyChart) OR A paper copy in the mail If you have any lab test that is abnormal or we need to change your treatment, we will call you to review the results.  Testing/Procedures:  Your physician has requested that you have an echocardiogram. Echocardiography is a painless test that uses sound waves to create images of your heart. It provides your doctor with information about the size and shape of your heart and how well your hearts chambers and valves are working. This procedure takes approximately one hour. There are no restrictions for this procedure. Please do NOT wear cologne, perfume  or lotions (deodorant is allowed). Please arrive 15 minutes prior to your appointment time.   Follow-Up: At [facility], you and your health needs are our priority.  As part of our continuing mission to provide you with exceptional heart care, our providers are all part of one team.  This team includes your primary Cardiologist (physician) and Advanced Practice Providers or APPs (Physician Assistants and Nurse Practitioners) who all work together to provide you with the care you need, when you need it.  Your next appointment:   To be Determined    Provider:   Rosaline Bane, NP    We recommend signing up for the patient portal called MyChart.  Sign up information is provided on this After Visit Summary.  MyChart is used to connect with patients for Virtual Visits (Telemedicine).  Patients are able to view  lab/test results, encounter notes, upcoming appointments, etc.  Non-urgent messages can be sent to your provider as well.   To learn more about what you can do with MyChart, go to [email or web address].   Other Instructions  You have been referred to Dr. Orlando Danford, you will be hearing from the office to schedule you.   Adopting a Healthy Lifestyle.   Weight: Know what a healthy weight is for you (roughly BMI <25) and aim to maintain this. You can calculate your body mass index on your smart phone. Unfortunately, this is not the most accurate measure of healthy weight, but it is the simplest measurement to use. A more accurate measurement involves body scanning which measures lean muscle, fat tissue and bony density. We do not have this equipment at [facility].    Diet: Aim for 7+ servings of fruits and vegetables daily Limit animal fats in diet for cholesterol and heart health - choose grass fed whenever available Avoid highly processed foods (fast food burgers, tacos, fried chicken, pizza, hot dogs, french fries)  Saturated fat comes in the form of butter, lard, coconut oil, margarine, partially hydrogenated oils, dairy products, and fat in meat. These increase your risk of cardiovascular disease.  Use healthy plant oils, such as olive, canola, soy, corn, sunflower and peanut.  Whole foods such as fruits, vegetables and whole grains have fiber  Men need > 38 grams of fiber per day Women need > 25 grams of fiber per day  Load up on vegetables and fruits - one-half of your plate:  Aim for color and variety, and remember that potatoes dont count. Go for whole grains - one-quarter of your plate: Whole wheat, barley, wheat berries, quinoa, oats, brown rice, and foods made with them. If you want pasta, go with whole wheat pasta. Protein power - one-quarter of your plate: Fish, chicken, beans, and nuts are all healthy, versatile protein sources. Limit red meat. You need carbohydrates for energy!  The type of carbohydrate is more important than the amount. Choose carbohydrates such as vegetables, fruits, whole grains, beans, and nuts in the place of white rice, white pasta, potatoes (baked or fried), macaroni and cheese, cakes, cookies, and donuts.  If youre thirsty, drink water. Coffee and tea are good in moderation, but skip sugary drinks and limit milk and dairy products to one or two daily servings. Keep sugar intake at 6 teaspoons or 24 grams or LESS       Exercise: Aim for 150 min of moderate intensity exercise weekly for heart health, and weights twice weekly for bone health Stay active - any steps are better than no steps! Aim for 7-9 hours of sleep daily   Sleep: This provides your body with the reset and relaxation that it needs!  Aim to get 7-8 hours of sleep each night. Limit caffeine, screen time, and other distractions prior to bedtime.  Keep your bedroom cool and dark and do not wear heavy clothing to bed or use heavy bed covers - layer if needed.

## 2024-12-21 ENCOUNTER — Other Ambulatory Visit: Payer: Self-pay | Admitting: Family

## 2024-12-21 ENCOUNTER — Other Ambulatory Visit (HOSPITAL_BASED_OUTPATIENT_CLINIC_OR_DEPARTMENT_OTHER): Payer: Self-pay

## 2024-12-21 DIAGNOSIS — K581 Irritable bowel syndrome with constipation: Secondary | ICD-10-CM

## 2024-12-26 ENCOUNTER — Other Ambulatory Visit (HOSPITAL_BASED_OUTPATIENT_CLINIC_OR_DEPARTMENT_OTHER): Payer: Self-pay

## 2024-12-26 ENCOUNTER — Other Ambulatory Visit: Payer: Self-pay | Admitting: Family

## 2024-12-26 ENCOUNTER — Telehealth: Payer: Self-pay | Admitting: Family

## 2024-12-26 ENCOUNTER — Other Ambulatory Visit: Payer: Self-pay

## 2024-12-26 DIAGNOSIS — K581 Irritable bowel syndrome with constipation: Secondary | ICD-10-CM

## 2024-12-26 DIAGNOSIS — K219 Gastro-esophageal reflux disease without esophagitis: Secondary | ICD-10-CM

## 2024-12-26 MED ORDER — ZEPBOUND 10 MG/0.5ML ~~LOC~~ SOAJ
10.0000 mg | SUBCUTANEOUS | 1 refills | Status: AC
  Filled 2024-12-26: qty 2, 28d supply, fill #0

## 2024-12-26 MED ORDER — LINACLOTIDE 145 MCG PO CAPS
145.0000 ug | ORAL_CAPSULE | Freq: Every day | ORAL | 1 refills | Status: AC
  Filled 2024-12-27: qty 90, 90d supply, fill #0

## 2024-12-26 MED ORDER — PANTOPRAZOLE SODIUM 40 MG PO TBEC
40.0000 mg | DELAYED_RELEASE_TABLET | Freq: Every day | ORAL | 0 refills | Status: AC
  Filled 2024-12-26: qty 90, 90d supply, fill #0

## 2024-12-26 NOTE — Telephone Encounter (Signed)
 "  Got pt scheduled  "

## 2024-12-26 NOTE — Telephone Encounter (Signed)
 Please contact pt to schedule a follow up appointment.

## 2024-12-26 NOTE — Telephone Encounter (Signed)
 Pt states she takes linzess  but when she went to the pharmacy they stated it hasn't been refilled and it is no longer showing up on her medication list. She wants to know why this medication is no longer being filled. Please advise.

## 2024-12-27 ENCOUNTER — Other Ambulatory Visit (HOSPITAL_BASED_OUTPATIENT_CLINIC_OR_DEPARTMENT_OTHER): Payer: Self-pay

## 2024-12-31 ENCOUNTER — Other Ambulatory Visit (HOSPITAL_BASED_OUTPATIENT_CLINIC_OR_DEPARTMENT_OTHER): Payer: Self-pay

## 2024-12-31 ENCOUNTER — Other Ambulatory Visit (HOSPITAL_BASED_OUTPATIENT_CLINIC_OR_DEPARTMENT_OTHER)

## 2025-01-09 ENCOUNTER — Ambulatory Visit: Payer: Self-pay

## 2025-01-09 NOTE — Telephone Encounter (Signed)
 FYI Only or Action Required?: FYI only for provider: appointment scheduled on 2/5.  Patient was last seen in primary care on 07/06/2024 by Daryl Setter, NP.  Called Nurse Triage reporting Alopecia and Hair/Scalp Problem.  Symptoms began a year ago but started worsening couple weeks ago.  Interventions attempted: Nothing.  Symptoms are: gradually worsening.  Triage Disposition: See Physician Within 24 Hours  Patient/caregiver understands and will follow disposition?: Yes       Reason for Disposition  [1] Scalp is red and painful in area of hair loss AND [2] large (more than 1 inch; 2.5 cm)  Answer Assessment - Initial Assessment Questions This RN recommended pt be examined in next 24 hours, scheduled for tomorrow afternoon with PCP office per pt preference. Advised call back or seek immediate care if new or worsening symptoms.     1. LOCATION: Where is the hair loss? (e.g., all of scalp, parts of scalp, back of head or neck)     Parts of scalp especially near front  2. DESCRIPTION: What does it look like? (e.g., thinning of hair, balding, patches of hair missing)     Larger than 1 inch for patches of hair loss  3. ONSET: When did the hair loss begin? (e.g., sudden or gradual onset; days, weeks, months or years ago)     Started about a year ago after taking zepbound  but been worsening over past couple weeks  4. OTHER SYMPTOMS: What does the scalp look like where the hair is missing? (e.g., normal, redness, crusts, scarring)     Scalp is just red, really itchy and irritated, not really pain  6. CAUSE: What do you think is causing the hair loss?     Thinks started after zepbound  but not sure why sudden worsening Not tried anything different, don't really change up hair products  Denies: Pain at scalp Fever Other symptoms  Protocols used: Hair Loss-A-AH

## 2025-01-09 NOTE — Progress Notes (Unsigned)
 [facility] [address] [place], [place] [address] [phone]  Date:  01/10/2025   Name:  Dana Carpenter   DOB:  06-28-1976   MRN:  [id number]  PCP:  Daryl Setter, NP    Chief Complaint: No chief complaint on file.   History of Present Illness:  Dana Carpenter is a 49 y.o. very pleasant female patient who presents with the following:  Primary patient of my partner Setter Archer, here today with concern about hair loss and scalp irritation  Discussed the use of AI scribe software for clinical note transcription with the patient, who gave verbal consent to proceed.  History of Present Illness     Patient Active Problem List   Diagnosis Date Noted   Overweight (BMI 25.0-29.9) 03/28/2024   Irritable bowel syndrome with constipation 03/28/2024   Low libido 03/28/2024   Thyroid  nodule 03/28/2024   Lumbar radiculopathy 08/26/2023   Anxiety 02/14/2023   Preventative health care 01/18/2023   Family history of heart disease 01/18/2023   GERD (gastroesophageal reflux disease) 02/08/2022   Insomnia 01/13/2022   Chronic constipation 01/13/2022   Migraine without status migrainosus, not intractable 01/13/2022   Depression 07/08/2021   Encounter for general adult medical examination with abnormal findings 02/13/2015   Grief at loss of child 03/27/2014   Abnormal uterine bleeding (AUB) 08/21/2013   History of pulmonary embolism 03/26/2013    Past Medical History:  Diagnosis Date   Abnormal uterine bleeding (AUB) 08/21/2013   Acne 11/27/2014   Anxiety    Chronic constipation 01/13/2022   Depression    Family history of heart disease 01/18/2023   GERD (gastroesophageal reflux disease) 02/08/2022   Grief at loss of child 03/27/2014   History of pulmonary embolism 03/26/2013   Insomnia 01/13/2022   Left leg swelling 01/13/2022   Malar rash 11/27/2014   Migraine without status migrainosus, not  intractable 01/13/2022   Obesity (BMI 30-39.9) 07/08/2021   Preventative health care 01/18/2023   Rhinorrhea 01/13/2022   Strep pharyngitis    Tinnitus of both ears 01/18/2023    Past Surgical History:  Procedure Laterality Date   DIAGNOSTIC LAPAROSCOPY WITH REMOVAL OF ECTOPIC PREGNANCY N/A 01/27/2017   Procedure: DIAGNOSTIC LAPAROSCOPY WITH RIGHT SALPINGECTOMY FOR RIGHT ECTOPIC PREGNANCY;  Surgeon: Ozell LITTIE Cowman, MD;  Location: [facility];  Service: Gynecology;  Laterality: N/A;   WISDOM TOOTH EXTRACTION Bilateral     Social History[1]  Family History  Problem Relation Age of Onset   Heart attack Mother 23       deceased- also had lung disorder (? pulmonary fibrosis)   Lung disease Mother    Hypertension Father    Healthy Sister        x 1   Diabetes Sister 66       type II   Hypertension Sister    Dementia Maternal Grandmother        6   Heart disease Maternal Grandfather    Cardiomyopathy Maternal Grandfather    Breast cancer Paternal Grandmother    Diabetes Paternal Grandfather    Prostate cancer Paternal Grandfather    Heart disease Maternal Aunt        heart disease   Cardiomyopathy Maternal Aunt        heart transplant   Cardiomyopathy Maternal Aunt    Cardiomyopathy Maternal Aunt    Heart disease Cousin        female cousin on  mom's side weak heart   Cardiomyopathy Cousin        had heart transplant   Colon polyps Neg Hx    Colon cancer Neg Hx    Esophageal cancer Neg Hx    Stomach cancer Neg Hx    Rectal cancer Neg Hx     Allergies[2]  Medication list has been reviewed and updated.  Medications Ordered Prior to Encounter[3]  Review of Systems:  As per HPI- otherwise negative.   Physical Examination: There were no vitals filed for this visit. There were no vitals filed for this visit. There is no height or weight on file to calculate BMI. Ideal Body Weight:    GEN: no acute distress. HEENT: Atraumatic, Normocephalic.  Ears and Nose: No  external deformity. CV: RRR, No M/G/R. No JVD. No thrill. No extra heart sounds. PULM: CTA B, no wheezes, crackles, rhonchi. No retractions. No resp. distress. No accessory muscle use. ABD: S, NT, ND, +BS. No rebound. No HSM. EXTR: No c/c/e PSYCH: Normally interactive. Conversant.    Assessment and Plan: No diagnosis found.  Assessment & Plan   Signed Harlene Schroeder, MD    [1]  Social History Tobacco Use   Smoking status: Never    Passive exposure: Never   Smokeless tobacco: Never   Tobacco comments:    never used tobacco  Vaping Use   Vaping status: Never Used  Substance Use Topics   Alcohol use: No    Alcohol/week: 0.0 standard drinks of alcohol    Comment: occasional   Drug use: No  [2] No Known Allergies [3]  Current Outpatient Medications on File Prior to Visit  Medication Sig Dispense Refill   fluticasone  (FLONASE ) 50 MCG/ACT nasal spray Place 2 sprays into both nostrils daily. 16 g 6   linaclotide  (LINZESS ) 145 MCG CAPS capsule Take 1 capsule (145 mcg total) by mouth daily 30 minutes before a meal. 90 capsule 1   pantoprazole  (PROTONIX ) 40 MG tablet Take 1 tablet (40 mg total) by mouth daily. 90 tablet 0   tirzepatide  (ZEPBOUND ) 10 MG/0.5ML Pen Inject 10 mg into the skin once a week. 2 mL 1   traZODone  (DESYREL ) 50 MG tablet Take 0.5-1 tablets (25-50 mg total) by mouth at bedtime as needed for sleep. 30 tablet 3   No current facility-administered medications on file prior to visit.   "

## 2025-01-10 ENCOUNTER — Encounter: Payer: Self-pay | Admitting: Family Medicine

## 2025-01-10 ENCOUNTER — Ambulatory Visit: Admitting: Family Medicine

## 2025-01-10 VITALS — BP 113/81 | HR 89 | Temp 97.7°F | Resp 16 | Ht 67.0 in | Wt 169.0 lb

## 2025-01-10 DIAGNOSIS — L659 Nonscarring hair loss, unspecified: Secondary | ICD-10-CM

## 2025-01-10 NOTE — Patient Instructions (Signed)
Good to see you!   I will be in touch with your labs asap 

## 2025-01-11 ENCOUNTER — Encounter: Payer: Self-pay | Admitting: Family Medicine

## 2025-01-11 LAB — FERRITIN: Ferritin: 75.5 ng/mL (ref 10.0–291.0)

## 2025-01-11 LAB — VITAMIN D 25 HYDROXY (VIT D DEFICIENCY, FRACTURES): VITD: 57.92 ng/mL (ref 30.00–100.00)

## 2025-01-11 LAB — VITAMIN B12: Vitamin B-12: 816 pg/mL (ref 211–911)

## 2025-01-11 LAB — TSH: TSH: 1.26 u[IU]/mL (ref 0.35–5.50)

## 2025-01-29 ENCOUNTER — Ambulatory Visit: Admitting: Family

## 2025-01-30 ENCOUNTER — Other Ambulatory Visit (HOSPITAL_BASED_OUTPATIENT_CLINIC_OR_DEPARTMENT_OTHER)

## 2025-03-05 ENCOUNTER — Ambulatory Visit: Admitting: Medical Genetics
